# Patient Record
Sex: Female | Born: 1950 | Race: White | Hispanic: No | Marital: Married | Smoking: Never smoker
Health system: Southern US, Community
[De-identification: ages and names within clinical notes are randomized; demographics above are authoritative.]

## PROBLEM LIST (undated history)

## (undated) DIAGNOSIS — C801 Malignant (primary) neoplasm, unspecified: Secondary | ICD-10-CM

## (undated) DIAGNOSIS — K219 Gastro-esophageal reflux disease without esophagitis: Secondary | ICD-10-CM

## (undated) DIAGNOSIS — E7800 Pure hypercholesterolemia, unspecified: Secondary | ICD-10-CM

## (undated) DIAGNOSIS — I2699 Other pulmonary embolism without acute cor pulmonale: Secondary | ICD-10-CM

## (undated) DIAGNOSIS — Z9221 Personal history of antineoplastic chemotherapy: Secondary | ICD-10-CM

## (undated) DIAGNOSIS — F419 Anxiety disorder, unspecified: Secondary | ICD-10-CM

## (undated) DIAGNOSIS — M792 Neuralgia and neuritis, unspecified: Secondary | ICD-10-CM

## (undated) DIAGNOSIS — R32 Unspecified urinary incontinence: Secondary | ICD-10-CM

## (undated) DIAGNOSIS — Z923 Personal history of irradiation: Secondary | ICD-10-CM

## (undated) HISTORY — PX: OTHER SURGICAL HISTORY: SHX169

## (undated) HISTORY — PX: APPENDECTOMY: SHX54

## (undated) HISTORY — PX: MASTECTOMY: SHX3

---

## 2011-02-01 DIAGNOSIS — E7800 Pure hypercholesterolemia, unspecified: Secondary | ICD-10-CM | POA: Insufficient documentation

## 2016-01-15 ENCOUNTER — Encounter: Payer: Medicare HMO | Attending: Surgery | Admitting: Surgery

## 2016-01-15 DIAGNOSIS — Z86711 Personal history of pulmonary embolism: Secondary | ICD-10-CM | POA: Diagnosis not present

## 2016-01-15 DIAGNOSIS — G629 Polyneuropathy, unspecified: Secondary | ICD-10-CM | POA: Insufficient documentation

## 2016-01-15 DIAGNOSIS — C50111 Malignant neoplasm of central portion of right female breast: Secondary | ICD-10-CM | POA: Insufficient documentation

## 2016-01-15 DIAGNOSIS — L98491 Non-pressure chronic ulcer of skin of other sites limited to breakdown of skin: Secondary | ICD-10-CM | POA: Insufficient documentation

## 2016-01-15 DIAGNOSIS — C50911 Malignant neoplasm of unspecified site of right female breast: Secondary | ICD-10-CM | POA: Diagnosis present

## 2016-01-15 DIAGNOSIS — E669 Obesity, unspecified: Secondary | ICD-10-CM | POA: Diagnosis not present

## 2016-01-15 DIAGNOSIS — K219 Gastro-esophageal reflux disease without esophagitis: Secondary | ICD-10-CM | POA: Diagnosis not present

## 2016-01-15 DIAGNOSIS — L599 Disorder of the skin and subcutaneous tissue related to radiation, unspecified: Secondary | ICD-10-CM | POA: Insufficient documentation

## 2016-01-15 DIAGNOSIS — Z85828 Personal history of other malignant neoplasm of skin: Secondary | ICD-10-CM | POA: Diagnosis not present

## 2016-01-15 DIAGNOSIS — E785 Hyperlipidemia, unspecified: Secondary | ICD-10-CM | POA: Insufficient documentation

## 2016-01-15 DIAGNOSIS — R32 Unspecified urinary incontinence: Secondary | ICD-10-CM | POA: Diagnosis not present

## 2016-01-16 NOTE — Progress Notes (Signed)
SHAURI, ELLINGHAM ([id number]) Visit Report for 01/15/2016 HBO Risk Assessment Details Patient Name: [name] [name], [name] [name] 01/15/2016 9:30 Date of Service: AM Medical Record [id number] Number: Patient Account Number: [id number] 11-24-1950 (65 y.o. Treating RN: Ahmed Prima Date of Birth/Sex: Female) Other Clinician: Cornell Barman Primary Care Physician: Emily Filbert Treating Christin Fudge Referring Physician: Emily Filbert Physician/Extender: Suella Grove in Treatment: 0 HBO Risk Assessment Items Answer Barotrauma Risks: Upper Respiratory Infections Yes Prior Radiation Treatment to Head/Neck No Tracheostomy No Ear problems or surgery (otosclerosis)- Consider pressure equalization tubes No Sinus Problems, Sinus Obstruction No Pulmonary Risks: Currently seeing a pulmonologisto No Emphysema No Pneumothorax No Tuberculosis No Other lung problems (COPD with CO2 retention, lesions, surgery) -Refer to CPGs No Congestive heart Failure -Consider holding HBO if ejection fraction<30% No History of smoking No Bullous Disease, Blebs No Other pulmonary abnormalities No Cardiac Risks: Currently seeing a cardiologisto No Pacemaker/AICD No Hypertension No Diuretic Used (water pill). If yes, last time taken: No History of prior or current malignancy (Cancer) Surgery Yes Radiation therapy Yes If Yes for Radiation Therapy, number of treatments received: unknown Chemotherapy Yes CHERELL, MARKE ([id number]) Ophthalmic Risks: Optic Neuritis No Cataracts No Myopia No Retinopathy or Retinal Detachment Surgery- Consider pressure equalization tubes No Confinement Anxiety Claustrophobia No Dialysis Dialysis No Any implants; medical or non-medical No Pregnancy No Seizures Seizures No Currently using these medications: Aspirin No Digoxin (CHF patient) No Narcotics Yes Nitroprusside No Phenothiazine (Thorazine,etc.) No Prednisone or other steroids No Disulfiram (Antabuse) No Mafenide Acetate  (Sulfamylon-burn cream) No Amiodarone No Electronic Signature(s) Signed: 01/15/2016 5:39:56 PM By: Alric Quan Entered By: Alric Quan on 01/15/2016 10:11:37

## 2016-01-16 NOTE — Progress Notes (Signed)
[name] [name], [name] [name] ([id number]) Visit Report for 01/15/2016 Chief Complaint Document Details Patient Name: [name] [name], [name] [name] 01/15/2016 9:30 Date of Service: AM Medical Record [id number] Number: Patient Account Number: [id number] 01/25/1951 (65 y.o. Treating RN: Ahmed Prima Date of Birth/Sex: Female) Other Clinician: Primary Care Physician: Emily Filbert Treating Christin Fudge Referring Physician: Emily Filbert Physician/Extender: Suella Grove in Treatment: 0 Information Obtained from: Patient Chief Complaint Patient seen for complaints of Non-Healing Wound to the right chest wall and axilla which she has had for about 2 years. Electronic Signature(s) Signed: 01/15/2016 10:10:01 AM By: Christin Fudge MD, FACS Entered By: Christin Fudge on 01/15/2016 10:10:01 [name] [name] ([id number]) -------------------------------------------------------------------------------- HPI Details Patient Name: [name] [name], [name] [name] 01/15/2016 9:30 Date of Service: AM Medical Record [id number] Number: Patient Account Number: [id number] Nov 07, 1950 (65 y.o. Treating RN: Ahmed Prima Date of Birth/Sex: Female) Other Clinician: Primary Care Physician: Emily Filbert Treating Christin Fudge Referring Physician: Emily Filbert Physician/Extender: Suella Grove in Treatment: 0 History of Present Illness Location: right chest wall and axilla Quality: Patient reports experiencing a dull pain to affected area(s). Severity: Patient states wound are getting worse. Duration: Patient has had the wound for > 24 months prior to seeking treatment at the wound center Timing: Pain in wound is constant (hurts all the time) Context: The wound appeared gradually over time Modifying Factors: Other treatment(s) tried include:radiation and chemotherapy received 2 different occasions in 2006 and 2015 Associated Signs and Symptoms: Patient reports having:general malaise and tiredness and feels very weak most of the day HPI Description: 65 year old  patient who has been sent by her PCP Dr. Emily Filbert who saw her recently on seventh of June 2017 for right anterior chest wall chronic pain and draining wound which has been there for several years. 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 Her past surgical history is significant for an axillary lymphadenectomy on the right side in February 2015, partial mastectomy in March 2015, and appendectomy. Past medical history significant for GERD, hyperlipidemia, obesity, peripheral neuropathy, pulmonary embolism, urinary incontinence. Reviewing the notes from a recent visit in May where she saw her oncologist Dr. Acey Lav, she had a radiation-induced angiosarcoma and developed skin nodules and right breast mass in August 2014 after treatment of her breast cancer. she received 5 cycles of chemotherapy and had posttreatment neuropathy and thrombocytopenia. 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 he also received concurrent radiation therapy. The radiation treatment was completed in June 2015. the medical oncologist noted that she had some breakdown of skin and her radiation-induced multifocal angiosarcoma was showing no evidence of local recurrence or metastatic disease and he recommended continue surveillance. The treatments summary of radiation oncology done by Dr. Clemon Chambers -- revealed that she had a right chest wall radiation between 11/08/2013 to 12/10/2013 and a total of 5000 cGy was given. She also received a boost of 7000 cGy between May 18 to 12/27/2013. note was made that the gross disease of the chest wall resolved after 2 weeks of therapy and she was to follow-up with her surgeon and medical oncologist. Electronic Signature(s) Signed: 01/15/2016 10:11:07 AM By: Christin Fudge MD, FACS Previous Signature: 01/15/2016 9:43:05 AM Version By: Christin Fudge MD, FACS Previous Signature: 01/15/2016  9:28:51 AM Version By: Christin Fudge MD, FACS Entered By: Christin Fudge on 01/15/2016 10:11:07 [name] [name], [name] [name] ([id number]) [name] [name], [name] [name] ([id number]) -------------------------------------------------------------------------------- Physical Exam Details Patient Name: [name] [name], [name] [name] 01/15/2016 9:30 Date of Service: AM Medical Record [id number] Number: Patient Account Number: [id number] [birth year] (65 y.o. Treating  RN: Ahmed Prima Date of Birth/Sex: Female) Other Clinician: Primary Care Physician: Emily Filbert Treating Christin Fudge Referring Physician: Emily Filbert Physician/Extender: Suella Grove in Treatment: 0 Constitutional . Pulse regular. Respirations normal and unlabored. Afebrile. . Eyes Nonicteric. Reactive to light. Ears, Nose, Mouth, and Throat Lips, teeth, and gums WNL.Marland Kitchen Moist mucosa without lesions. Neck supple and nontender. No palpable supraclavicular or cervical adenopathy. Normal sized without goiter. Respiratory WNL. No retractions.. Cardiovascular Pedal Pulses WNL. No clubbing, cyanosis or edema. Chest the right breast has been removed with the previous mastectomy and the skin flaps show some thickening and changes of delayed effects of radiation.. no masses are palpable on the chest wall or the axilla and the supraclavicular area.. Gastrointestinal (GI) Abdomen without masses or tenderness.. No liver or spleen enlargement or tenderness.. Lymphatic No adneopathy. No adenopathy. No adenopathy. Musculoskeletal Adexa without tenderness or enlargement.. Digits and nails w/o clubbing, cyanosis, infection, petechiae, ischemia, or inflammatory conditions.. Integumentary (Hair, Skin) No suspicious lesions. No crepitus or fluctuance. No peri-wound warmth or erythema. No masses.Marland Kitchen Psychiatric Judgement and insight Intact.. No evidence of depression, anxiety, or agitation.. Notes the patient has had a right-sided mastectomy and axillary lymph node dissection and has  received radiation on 2 different occasions along with chemotherapy which has caused thickening and induration of the skin and subcutaneous tissue which is showing signs of late effects of radiation. At the present time there is some superficial excoriation of the skin but no large ulcerations. AILSA, MASSAQUOI ([id number]) Electronic Signature(s) Signed: 01/15/2016 10:21:05 AM By: Christin Fudge MD, FACS Entered By: Christin Fudge on 01/15/2016 10:21:05 [name] [name] ([id number]) -------------------------------------------------------------------------------- Physician Orders Details Patient Name: [name] [name], [name] [name] 01/15/2016 9:30 Date of Service: AM Medical Record [id number] Number: Patient Account Number: [id number] [birth year] (65 y.o. Treating RN: Ahmed Prima Date of Birth/Sex: Female) Other Clinician: Primary Care Physician: Emily Filbert Treating Christin Fudge Referring Physician: Emily Filbert Physician/Extender: Suella Grove in Treatment: 0 Verbal / Phone Orders: No Diagnosis Coding Hyperbaric Oxygen Therapy o Evaluate for HBO Therapy Radiology o X-ray, Chest - HBO Clearance [employer] o EKG - HBO Clearance Electronic Signature(s) Signed: 01/15/2016 5:05:24 PM By: Christin Fudge MD, FACS Signed: 01/15/2016 5:39:56 PM By: Alric Quan Entered By: Alric Quan on 01/15/2016 10:01:12 [name] [name] ([id number]) -------------------------------------------------------------------------------- Problem List Details Patient Name: [name] [name], [name] [name] 01/15/2016 9:30 Date of Service: AM Medical Record [id number] Number: Patient Account Number: [id number] 1951-05-05 (65 y.o. 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 Treating RN: Ahmed Prima Date of Birth/Sex: Female) Other Clinician: Primary Care Physician: Emily Filbert Treating Christin Fudge Referring Physician: Emily Filbert Physician/Extender: Suella Grove in Treatment: 0 Subjective Chief Complaint Information obtained from Patient Patient seen for complaints of Non-Healing Wound to the right chest wall and axilla which  she has had for about 2 years. History of Present Illness (HPI) The following HPI elements were documented for the patient's wound: Location: right chest wall and axilla Quality: Patient reports experiencing a dull pain to affected area(s). Severity: Patient states wound are getting worse. Duration: Patient has had the wound for > 24 months prior to seeking treatment at the wound center Timing: Pain in wound is constant (hurts all the time) Context: The wound appeared gradually over time Modifying Factors: Other treatment(s) tried include:radiation and chemotherapy received 2 different occasions in 2006 and 2015 Associated Signs and Symptoms: Patient reports having:general malaise and tiredness and feels very weak most of the day 65 year old  patient who has been sent by her PCP Dr. Emily Filbert who saw her recently on seventh of June 2017 for right anterior chest wall chronic pain and draining wound which has been there for several years. 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 Her past surgical history is significant for an axillary lymphadenectomy on the right side in February 2015, partial mastectomy in March 2015, and appendectomy. Past medical history significant for GERD, hyperlipidemia, obesity, peripheral neuropathy, pulmonary embolism, urinary incontinence. Reviewing the notes from a recent visit in May where she saw her oncologist Dr. Acey Lav, she had a radiation-induced angiosarcoma and developed skin nodules and right breast mass in August 2014 after treatment of her breast cancer. she received 5 cycles of chemotherapy and had posttreatment neuropathy and thrombocytopenia. 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 he also received concurrent radiation therapy. The radiation treatment was completed in June 2015. the medical oncologist noted that she had some breakdown of skin and her radiation-induced multifocal angiosarcoma was showing no evidence of local recurrence or metastatic disease and he recommended continue surveillance. The treatments summary of radiation oncology done by Dr. Clemon Chambers -- revealed that she had a right chest wall radiation between 11/08/2013 to 12/10/2013 and a total of 5000 cGy was given. She also received a boost of 7000 cGy between May 18 to 12/27/2013. note was made that the gross disease of the chest wall resolved after 2 weeks of therapy and she was to follow-up with her surgeon and [place], [place] ([id number]) oncologist. Wound History Patient presents with 1 open wound that has been present for approximately 2 yrs. Patient has been treating wound in the following manner: vaseline.  Laboratory tests have not been performed in the last month. Patient reportedly has not tested positive for an antibiotic resistant organism. Patient reportedly has not tested positive for osteomyelitis. Patient reportedly has not had testing performed to evaluate circulation in the legs. Patient experiences the following problems associated with their wounds: swelling. Patient History Information obtained from Patient. Allergies NKDA Family History Cancer - Maternal Grandparents, Heart Disease - Father, Hypertension - Father, Lung Disease - Mother, Stroke - Father, No family history of Diabetes, Hereditary Spherocytosis, Kidney Disease, Seizures, Thyroid Problems, Tuberculosis. Social History Never smoker, Marital Status - Married, Alcohol Use - Never, Drug Use - No History, Caffeine Use - Rarely. Medical History Neurologic Patient has history of Neuropathy Oncologic Patient has history of Received Chemotherapy - 2006, Received Radiation - 2006 Medical And Surgical History Notes Oncologic breast cancer skin cancer Review of Systems (ROS) Constitutional Symptoms (General Health) The patient has no complaints or symptoms. Eyes The patient has no complaints or symptoms. Ear/Nose/Mouth/Throat The patient has no complaints or symptoms. Hematologic/Lymphatic hx of blood clot Respiratory hx of pulmonary embolism  Cardiovascular hyperlipidemia Gastrointestinal esophageal reflux GERD. tubular adenoma of colon JAIEL, HOWER ([id number]) Endocrine The patient has no complaints or symptoms. Genitourinary Complains or has symptoms of Incontinence/dribbling. Immunological The patient has no complaints or symptoms. Integumentary (Skin) Complains or has symptoms of Wounds, skin cancer on right breast Musculoskeletal The patient has no complaints or symptoms. Medications Tylenol 325 mg tablet oral 2 2 tablet oral every 4 hours as needed for pain tramadol 50 mg tablet oral tablet  oral lorazepam 0.5 mg tablet oral 1 1 tablet oral every 8 hours as needed gabapentin 300 mg capsule oral 3 3 capsule oral nightly simvastatin 20 mg tablet oral tablet oral Flonase Allergy Relief 50 mcg/actuation nasal spray,suspension nasal spray,suspension nasal daily as needed omeprazole 20 mg capsule,delayed release oral capsule,delayed release(DR/EC) oral once daily Objective Constitutional Pulse regular. Respirations normal and unlabored. Afebrile. Vitals Time Taken: 9:10 AM, Height: 66 in, Source: Stated, Weight: 213.4 lbs, Source: Measured, BMI: 34.4, Temperature: 97.7 F, Pulse: 77 bpm, Respiratory Rate: 20 breaths/min, Blood Pressure: 130/78 mmHg. Eyes Nonicteric. Reactive to light. Ears, Nose, Mouth, and Throat Lips, teeth, and gums WNL.Marland Kitchen Moist mucosa without lesions. Neck supple and nontender. No palpable supraclavicular or cervical adenopathy. Normal sized without goiter. Respiratory WNL. No retractions.Marcine Matar, Freda Munro ([id number]) Cardiovascular Pedal Pulses WNL. No clubbing, cyanosis or edema. Chest the right breast has been removed with the previous mastectomy and the skin flaps show some thickening and changes of delayed effects of radiation.. no masses are palpable on the chest wall or the axilla and the supraclavicular area.. Gastrointestinal (GI) Abdomen without masses or tenderness.. No liver or spleen enlargement or tenderness.. Lymphatic No adneopathy. No adenopathy. No adenopathy. Musculoskeletal Adexa without tenderness or enlargement.. Digits and nails w/o clubbing, cyanosis, infection, petechiae, ischemia, or inflammatory conditions.Marland Kitchen Psychiatric Judgement and insight Intact.. No evidence of depression, anxiety, or agitation.. General Notes: the patient has had a right-sided mastectomy and axillary lymph node dissection and has received radiation on 2 different occasions along with chemotherapy which has caused thickening and induration of the skin and  subcutaneous tissue which is showing signs of late effects of radiation. At the present time there is some superficial excoriation of the skin but no large ulcerations. Integumentary (Hair, Skin) No suspicious lesions. No crepitus or fluctuance. No peri-wound warmth or erythema. No masses.. Other Condition(s) Patient presents with Other Dermatologic Condition located on the Right right breast mastectomy. 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 She then developed an angiosarcoma of the right breast and chest wall and this was treated with a mastectomy, aggressive chemotherapy and radiation which was completed in June 2015. Details of her radiation therapy have been noted. At this time she is showing signs of delayed effects of radiation to the right chest wall, subcutaneous tissue and axilla and I believe she would benefit from hyperbaric oxygen therapy, to be given with the appropriate protocol over 30 settings to be given over 6 weeks. I have discussed this in detail with the patient and her husband was at the bedside and the risks benefits alternatives and  all the possible benefits and complications were gone through. She is agreeable about it and principal and we will work with her insurance company for authorization regarding this treatment modality. Plan Hyperbaric Oxygen Therapy: Evaluate for HBO Therapy Radiology ordered were: X-ray, Chest - HBO Clearance ordered  were: EKG - HBO Clearance This 65 year old patient who has initially had a adenocarcinoma of the right breast followed with lumpectomy, axillary dissection and received radiation and chemotherapy in 2006. She then developed an angiosarcoma of the right breast and chest wall and this was treated with a mastectomy, aggressive chemotherapy and radiation which was completed in June 2015. Details of her radiation therapy have been noted. At this time she is showing signs of delayed effects of radiation to the right chest wall, subcutaneous tissue and axilla and I believe she would benefit from hyperbaric oxygen therapy, to be given with the appropriate protocol over 30 settings to be given over 6 weeks. I have discussed this in detail with the patient and her husband was at the bedside and the risks benefits alternatives and all the possible benefits and complications were gone through. She is agreeable about it and principal and we will work with her insurance company for authorization regarding this treatment modality. MAKILA, MALER ([id number]) Electronic Signature(s) Signed: 01/15/2016 5:06:19 PM By: Christin Fudge MD, FACS Previous Signature: 01/15/2016 10:24:37 AM Version By: Christin Fudge MD, FACS Entered By: Christin Fudge on 01/15/2016 17:06:19 [name] [name] ([id number]) -------------------------------------------------------------------------------- ROS/PFSH Details Patient Name: [name] [name], [name] [name] 01/15/2016 9:30 Date of Service: AM Medical Record [id number] Number: Patient Account Number: [id number] 06/19/1951 (65 y.o. Treating RN: Ahmed Prima Date of Birth/Sex: Female) Other Clinician: Primary Care Physician: Emily Filbert Treating Christin Fudge Referring Physician: Emily Filbert Physician/Extender: Suella Grove in Treatment: 0 Information Obtained From Patient Wound History Do you currently have one or more open woundso Yes How many open wounds do you currently haveo 1 Approximately  how long have you had your woundso 2 yrs How have you been treating your wound(s) until nowo vaseline Has your wound(s) ever healed and then re-openedo No Have you had any lab work done in the past montho No Have you tested positive for an antibiotic resistant organism (MRSA, VRE)o No Have you tested positive for osteomyelitis (bone infection)o No Have you had any tests for circulation on your legso No Have you had other problems associated with your woundso Swelling Genitourinary Complaints and Symptoms: Positive for: Incontinence/dribbling Integumentary (Skin) Complaints and Symptoms: Positive for: Wounds Review of System Notes: skin cancer on right breast Constitutional Symptoms (General Health) Complaints and Symptoms: No Complaints or Symptoms Eyes Complaints and Symptoms: No Complaints or Symptoms Ear/Nose/Mouth/Throat [name] [name], KADRMAS ([id number]) Complaints and Symptoms: No Complaints or Symptoms Hematologic/Lymphatic Complaints and Symptoms: Review of System Notes: hx of blood clot Respiratory Complaints and Symptoms: Review of System Notes: hx of pulmonary embolism Cardiovascular Complaints and Symptoms: Review of System Notes: hyperlipidemia Gastrointestinal Complaints and Symptoms: Review of System Notes: esophageal reflux GERD. tubular adenoma of colon Endocrine Complaints and Symptoms: No Complaints or Symptoms Immunological Complaints and Symptoms: No Complaints or Symptoms Musculoskeletal Complaints and Symptoms: No Complaints or Symptoms Neurologic Medical History: Positive for: Neuropathy Oncologic Medical History: Positive for: Received Chemotherapy - 2006; Received Radiation - 2006 SAMIKA, WALDRIP ([id number]) Past Medical History Notes: breast cancer skin cancer Family and Social History Cancer: Yes - Maternal Grandparents; Diabetes: No; Heart Disease: Yes - Father; Hereditary Spherocytosis: No; Hypertension: Yes - Father; Kidney Disease:  No; Lung Disease: Yes - Mother; Seizures: No; Stroke:  Yes - Father; Thyroid Problems: No; Tuberculosis: No; Never smoker; Marital Status - Married; Alcohol Use: Never; Drug Use: No History; Caffeine Use: Rarely; Financial Concerns: No; Food, Clothing or Shelter Needs: No; Support System Lacking: No; Transportation Concerns: No; Advanced Directives: No; Patient does not want information on Advanced Directives; Do not resuscitate: No; Living Will: No; Medical Power of Attorney: No Physician Affirmation I have reviewed and agree with the above information. Electronic Signature(s) Signed: 01/15/2016 9:23:13 AM By: Christin Fudge MD, FACS Signed: 01/15/2016 5:39:56 PM By: Alric Quan Entered By: Christin Fudge on 01/15/2016 09:23:13 [name] [name] ([id number]) -------------------------------------------------------------------------------- SuperBill Details Patient Name: [name] [name] Date of Service: 01/15/2016 Medical Record Number: [id number] Patient Account Number: [id number] Date of Birth/Sex: 1951/07/24 (64 y.o. 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 C50.111 Malignant neoplasm of central portion of right female breast Facility Procedures CPT4 Code: [id number] Description: 99213 - WOUND CARE VISIT-LEV 3 EST PT Modifier: Quantity: 1 Physician Procedures CPT4: Description Modifier Quantity Code [id number] - WC PHYS LEVEL 4 - NEW PT 1 ICD-10 Description Diagnosis L59.9 Disorder of the skin and subcutaneous tissue related to radiation, unspecified  C50.911 Malignant neoplasm of unspecified site of right  female breast 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

## 2016-01-16 NOTE — Progress Notes (Signed)
[name] [name] ([id number]) Visit Report for 01/15/2016 Allergy List Details Patient Name: [name] [name], KREKELER Date of Service: 01/15/2016 9:30 AM Medical Record Number: [id number] Patient Account Number: [id number] Date of Birth/Sex: [birth year] (64 y.o. Female) Treating RN: Ahmed Prima Primary Care Physician: Emily Filbert Other Clinician: Referring Physician: Emily Filbert Treating Physician/Extender: Frann Rider in Treatment: 0 Allergies Active Allergies NKDA Allergy Notes Electronic Signature(s) Signed: 01/15/2016 5:39:56 PM By: Alric Quan Entered By: Alric Quan on 01/15/2016 09:55:32 [name] [name] ([id number]) -------------------------------------------------------------------------------- Arrival Information Details Patient Name: [name] [name] Date of Service: 01/15/2016 9:30 AM Medical Record Number: [id number] Patient Account Number: [id number] Date of Birth/Sex: Jan 12, 1951 (64 y.o. Female) Treating RN: Ahmed Prima Primary Care Physician: Emily Filbert Other Clinician: Referring Physician: Emily Filbert Treating Physician/Extender: Frann Rider in Treatment: 0 Visit Information Patient Arrived: Ambulatory Arrival Time: 09:08 Accompanied By: husband Transfer Assistance: None Patient Identification Verified: Yes Secondary Verification Process Yes Completed: Patient Requires Transmission-Based No Precautions: Patient Has Alerts: Yes Electronic Signature(s) Signed: 01/15/2016 5:39:56 PM By: Alric Quan Entered By: Alric Quan on 01/15/2016 09:08:35 [name] [name] ([id number]) -------------------------------------------------------------------------------- Clinic Level of Care Assessment Details Patient Name: [name] [name] Date of Service: 01/15/2016 9:30 AM Medical Record Number: [id number] Patient Account Number: [id number] Date of Birth/Sex: 1950-08-23 (64 y.o. Female) Treating RN: Ahmed Prima Primary Care Physician: Emily Filbert Other Clinician: Referring Physician: Emily Filbert Treating Physician/Extender: Frann Rider in Treatment: 0 Clinic Level of Care Assessment Items TOOL 2 Quantity Score X - Use when only an EandM is performed on the INITIAL visit 1 0 ASSESSMENTS - Nursing Assessment / Reassessment X - General Physical Exam (combine w/ comprehensive assessment (listed just 1 20 below) when performed on new pt. evals) X - Comprehensive Assessment (HX, ROS, Risk Assessments, Wounds Hx, etc.) 1 25 ASSESSMENTS - Wound and Skin Assessment / Reassessment []  - Simple Wound Assessment / Reassessment - one wound 0 []  - Complex Wound Assessment / Reassessment - multiple wounds 0 X - Dermatologic / Skin Assessment (not related to wound area) 1 10 ASSESSMENTS - Ostomy and/or Continence Assessment and Care []  - Incontinence Assessment and Management 0 []  - Ostomy Care Assessment and Management (repouching, etc.) 0 PROCESS - Coordination of Care []  - Simple Patient / Family Education for ongoing care 0 X - Complex (extensive) Patient / Family Education for ongoing care 1 20 X - Staff obtains Programmer, systems, Records, Test Results / Process Orders 1 10 []  - Staff telephones HHA, Nursing Homes / Clarify orders / etc 0 []  - Routine Transfer to another Facility (non-emergent condition) 0 []  - Routine Hospital Admission (non-emergent condition) 0 []  - New Admissions / Biomedical engineer / Ordering NPWT, Apligraf, etc. 0 []  - Emergency Hospital Admission (emergent condition) 0 X - Simple Discharge Coordination 1 10 [name] [name] ([id number]) []  - Complex (extensive) Discharge Coordination 0 PROCESS - Special Needs []  - Pediatric / Minor Patient Management 0 []  - Isolation Patient Management 0 []  - Hearing / Language / Visual special needs 0 []  - Assessment of Community assistance (transportation, D/C planning, etc.) 0 []  - Additional assistance / Altered mentation 0 []  - Support Surface(s) Assessment (bed,  cushion, seat, etc.) 0 INTERVENTIONS - Wound Cleansing / Measurement X - Wound Imaging (photographs - any number of wounds) 1 5 []  - Wound Tracing (instead of photographs) 0 []  - Simple Wound Measurement - one wound 0 []  - Complex Wound Measurement - multiple wounds 0 X - Simple Wound Cleansing - one wound 1 5 []  - Complex  Wound Cleansing - multiple wounds 0 INTERVENTIONS - Wound Dressings []  - Small Wound Dressing one or multiple wounds 0 []  - Medium Wound Dressing one or multiple wounds 0 []  - Large Wound Dressing one or multiple wounds 0 []  - Application of Medications - injection 0 INTERVENTIONS - Miscellaneous []  - External ear exam 0 []  - Specimen Collection (cultures, biopsies, blood, body fluids, etc.) 0 []  - Specimen(s) / Culture(s) sent or taken to Lab for analysis 0 []  - Patient Transfer (multiple staff / Harrel Lemon Lift / Similar devices) 0 []  - Simple Staple / Suture removal (25 or less) 0 []  - Complex Staple / Suture removal (26 or more) 0 [name] [name] ([id number]) []  - Hypo / Hyperglycemic Management (close monitor of Blood Glucose) 0 []  - Ankle / Brachial Index (ABI) - do not check if billed separately 0 Has the patient been seen at the hospital within the last three years: Yes Total Score: 105 Level Of Care: New/Established - Level 3 Electronic Signature(s) Signed: 01/15/2016 5:39:56 PM By: Alric Quan Entered By: Alric Quan on 01/15/2016 11:22:07 [name] [name] ([id number]) -------------------------------------------------------------------------------- Encounter Discharge Information Details Patient Name: [name] [name] Date of Service: 01/15/2016 9:30 AM Medical Record Number: [id number] Patient Account Number: [id number] Date of Birth/Sex: Jul 07, 1951 (65 y.o. Female) Treating RN: Ahmed Prima Primary Care Physician: Emily Filbert Other Clinician: Referring Physician: Emily Filbert Treating Physician/Extender: Frann Rider in Treatment:  0 Encounter Discharge Information Items Discharge Pain Level: 0 Discharge Condition: Stable Ambulatory Status: Ambulatory Discharge Destination: Home Transportation: Private Auto Accompanied By: husband Schedule Follow-up Appointment: Yes Medication Reconciliation completed and provided to Patient/Care No Franck Vinal: Provided on Clinical Summary of Care: 01/15/2016 Form Type Recipient Paper Patient SE Electronic Signature(s) Signed: 01/15/2016 10:28:00 AM By: Ruthine Dose Entered By: Ruthine Dose on 01/15/2016 10:28:00 [name] [name] ([id number]) -------------------------------------------------------------------------------- Lower Extremity Assessment Details Patient Name: [name] [name] Date of Service: 01/15/2016 9:30 AM Medical Record Number: [id number] Patient Account Number: [id number] Date of Birth/Sex: [birth year] (64 y.o. Female) Treating RN: Carolyne Fiscal, Debi Primary Care Physician: Emily Filbert Other Clinician: Referring Physician: Emily Filbert Treating Physician/Extender: Frann Rider in Treatment: 0 Electronic Signature(s) Signed: 01/15/2016 5:39:56 PM By: Alric Quan Entered By: Alric Quan on 01/15/2016 09:24:58 [name] [name] ([id number]) -------------------------------------------------------------------------------- Multi Wound Chart Details Patient Name: [name] [name] Date of Service: 01/15/2016 9:30 AM Medical Record Number: [id number] Patient Account Number: [id number] Date of Birth/Sex: [birth year] (64 y.o. Female) Treating RN: Ahmed Prima Primary Care Physician: Emily Filbert Other Clinician: Referring Physician: Emily Filbert Treating Physician/Extender: Frann Rider in Treatment: 0 Vital Signs Height(in): 66 Pulse(bpm): 77 Weight(lbs): 213.4 Blood Pressure 130/78 (mmHg): Body Mass Index(BMI): 34 Temperature(F): 97.7 Respiratory Rate 20 (breaths/min): Photos: [1:No Photos] [N/A:N/A] Wound Location: [1:Right Breast  (mastectomy site)] [N/A:N/A] Wounding Event: [1:Surgical Injury] [N/A:N/A] Primary Etiology: [1:Open Surgical Wound] [N/A:N/A] Comorbid History: [1:Neuropathy, Received Chemotherapy, Received Radiation] [N/A:N/A] Date Acquired: [1:03/17/2014] [N/A:N/A] Weeks of Treatment: [1:0] [N/A:N/A] Wound Status: [1:Open] [N/A:N/A] Classification: [1:Partial Thickness] [N/A:N/A] Exudate Amount: [1:None Present] [N/A:N/A] Wound Margin: [1:Flat and Intact] [N/A:N/A] Granulation Amount: [1:Large (67-100%)] [N/A:N/A] Granulation Quality: [1:Red, Pink] [N/A:N/A] Necrotic Amount: [1:None Present (0%)] [N/A:N/A] Exposed Structures: [1:Fascia: No Fat: No Tendon: No Muscle: No Joint: No Bone: No Limited to Skin Breakdown] [N/A:N/A] Epithelialization: [1:None] [N/A:N/A] Periwound Skin Texture: No Abnormalities Noted [N/A:N/A] Periwound Skin [1:Moist: Yes] [N/A:N/A] Moisture: Periwound Skin Color: Erythema: Yes [N/A:N/A] Erythema Location: Circumferential N/A N/A Temperature: No Abnormality N/A N/A Tenderness on Yes N/A N/A Palpation: Wound Preparation: Ulcer Cleansing: N/A N/A Rinsed/Irrigated with Saline Topical  Anesthetic Applied: Other: lidocaine 4% Treatment Notes Electronic Signature(s) Signed: 01/15/2016 5:39:56 PM By: Alric Quan Entered By: Alric Quan on 01/15/2016 09:52:40 [name] [name] ([id number]) -------------------------------------------------------------------------------- Multi-Disciplinary Care Plan Details Patient Name: [name] [name] Date of Service: 01/15/2016 9:30 AM Medical Record Number: [id number] Patient Account Number: [id number] Date of Birth/Sex: [birth year] (64 y.o. Female) Treating RN: Carolyne Fiscal, Debi Primary Care Physician: Emily Filbert Other Clinician: Referring Physician: Emily Filbert Treating Physician/Extender: Frann Rider in Treatment: 0 Active Inactive Orientation to the Wound Care Program Nursing Diagnoses: Knowledge deficit related to  the wound healing center program Goals: Patient/caregiver will verbalize understanding of the [place] Program Date Initiated: 01/15/2016 Goal Status: Active Interventions: Provide education on orientation to the wound center Notes: Pain, Acute or Chronic Nursing Diagnoses: Pain, acute or chronic: actual or potential Potential alteration in comfort, pain Goals: Patient will verbalize adequate pain control and receive pain control interventions during procedures as needed Date Initiated: 01/15/2016 Goal Status: Active Patient/caregiver will verbalize adequate pain control between visits Date Initiated: 01/15/2016 Goal Status: Active Interventions: Assess comfort goal upon admission Complete pain assessment as per visit requirements Notes: Wound/Skin Impairment [name], [name] ([id number]) Nursing Diagnoses: Impaired tissue integrity Goals: Ulcer/skin breakdown will have a volume reduction of 30% by week 4 Date Initiated: 01/15/2016 Goal Status: Active Ulcer/skin breakdown will have a volume reduction of 50% by week 8 Date Initiated: 01/15/2016 Goal Status: Active Ulcer/skin breakdown will have a volume reduction of 80% by week 12 Date Initiated: 01/15/2016 Goal Status: Active Interventions: Assess ulceration(s) every visit Notes: Electronic Signature(s) Signed: 01/15/2016 5:39:56 PM By: Alric Quan Entered By: Alric Quan on 01/15/2016 09:52:27 [name] [name] ([id number]) -------------------------------------------------------------------------------- Non-Wound Condition Assessment Details Patient Name: [name] [name] Date of Service: 01/15/2016 9:30 AM Medical Record Number: [id number] Patient Account Number: [id number] Date of Birth/Sex: 1951/06/20 (64 y.o. Female) Treating RN: Carolyne Fiscal, Debi Primary Care Physician: Emily Filbert Other Clinician: Referring Physician: Emily Filbert Treating Physician/Extender: Frann Rider in Treatment:  0 Non-Wound Condition: Condition: Other Dermatologic Condition Location: Other: right breast mastectomy Side: Right Photos Periwound Skin Texture Texture Color No Abnormalities Noted: No No Abnormalities Noted: No Moisture No Abnormalities Noted: No Electronic Signature(s) Signed: 01/15/2016 5:39:56 PM By: Alric Quan Entered By: Alric Quan on 01/15/2016 16:58:19 [name] [name] ([id number]) -------------------------------------------------------------------------------- Pain Assessment Details Patient Name: [name] [name] Date of Service: 01/15/2016 9:30 AM Medical Record Number: [id number] Patient Account Number: [id number] Date of Birth/Sex: 1950-11-22 (64 y.o. Female) Treating RN: Ahmed Prima Primary Care Physician: Emily Filbert Other Clinician: Referring Physician: Emily Filbert Treating Physician/Extender: Frann Rider in Treatment: 0 Active Problems Location of Pain Severity and Description of Pain Patient Has Paino Yes Site Locations Pain Location: Pain in Ulcers With Dressing Change: Yes Duration of the Pain. Constant / Intermittento Constant Rate the pain. Current Pain Level: 5 Worst Pain Level: 8 Least Pain Level: 2 Character of Pain Describe the Pain: Aching, Tender Pain Management and Medication Current Pain Management: Electronic Signature(s) Signed: 01/15/2016 5:39:56 PM By: Alric Quan Entered By: Alric Quan on 01/15/2016 09:10:10 [name] [name] ([id number]) -------------------------------------------------------------------------------- Patient/Caregiver Education Details Patient Name: [name] [name] Date of Service: 01/15/2016 9:30 AM Medical Record Number: [id number] Patient Account Number: [id number] Date of Birth/Gender: Apr 02, 1951 (64 y.o. Female) Treating RN: Ahmed Prima Primary Care Physician: Emily Filbert Other Clinician: Referring Physician: Emily Filbert Treating Physician/Extender: Frann Rider in  Treatment: 0 Education Assessment Education Provided To: Patient Education Topics Provided Wound/Skin Impairment: Handouts: Other: keep area clean and dry Methods: Demonstration, Explain/Verbal Responses: State content  correctly Electronic Signature(s) Signed: 01/15/2016 5:39:56 PM By: Alric Quan Entered By: Alric Quan on 01/15/2016 09:55:53 [name] [name] ([id number]) -------------------------------------------------------------------------------- [place] Details Patient Name: [name] [name] Date of Service: 01/15/2016 9:30 AM Medical Record Number: [id number] Patient Account Number: [id number] Date of Birth/Sex: Jun 27, 1951 (64 y.o. Female) Treating RN: Carolyne Fiscal, Debi Primary Care Physician: Emily Filbert Other Clinician: Referring Physician: Emily Filbert Treating Physician/Extender: Frann Rider in Treatment: 0 Vital Signs Time Taken: 09:10 Temperature (F): 97.7 Height (in): 66 Pulse (bpm): 77 Source: Stated Respiratory Rate (breaths/min): 20 Weight (lbs): 213.4 Blood Pressure (mmHg): 130/78 Source: Measured Reference Range: 80 - 120 mg / dl Body Mass Index (BMI): 34.4 Electronic Signature(s) Signed: 01/15/2016 5:39:56 PM By: Alric Quan Entered By: Alric Quan on 01/15/2016 09:12:01

## 2016-01-16 NOTE — Progress Notes (Signed)
NELEH, MCCLASKEY ([id number]) Visit Report for 01/15/2016 Abuse/Suicide Risk Screen Details Patient Name: [name] [name], [name] [name] 01/15/2016 9:30 Date of Service: AM Medical Record [id number] Number: Patient Account Number: [id number] 02/13/1951 (65 y.o. Treating RN: Ahmed Prima Date of Birth/Sex: Female) Other Clinician: Primary Care Physician: Emily Filbert Treating Britto, Errol Referring Physician: Emily Filbert Physician/Extender: Suella Grove in Treatment: 0 Abuse/Suicide Risk Screen Items Answer ABUSE/SUICIDE RISK SCREEN: Has anyone close to you tried to hurt or harm you recentlyo No Do you feel uncomfortable with anyone in your familyo No Has anyone forced you do things that you didnot want to doo No Do you have any thoughts of harming yourselfo No Patient displays signs or symptoms of abuse and/or neglect. No Electronic Signature(s) Signed: 01/15/2016 5:39:56 PM By: Alric Quan Entered By: Alric Quan on 01/15/2016 09:22:26 Dorna Bloom ([id number]) -------------------------------------------------------------------------------- Activities of Daily Living Details Patient Name: [name] [name] 01/15/2016 9:30 Date of Service: AM Medical Record [id number] Number: Patient Account Number: [id number] 10/19/1950 (64 y.o. Treating RN: Ahmed Prima Date of Birth/Sex: Female) Other Clinician: Primary Care Physician: Emily Filbert Treating Christin Fudge Referring Physician: Emily Filbert Physician/Extender: Suella Grove in Treatment: 0 Activities of Daily Living Items Answer Activities of Daily Living (Please select one for each item) Drive Automobile Completely Able Take Medications Completely Able Use Telephone Completely Able Care for Appearance Completely Able Use Toilet Completely Able Bath / Shower Completely Able Dress Self Completely Able Feed Self Completely Able Walk Completely Able Get In / Out Bed Completely Able Housework Completely Able Prepare Meals Completely  [place] for Self Completely Able Electronic Signature(s) Signed: 01/15/2016 5:39:56 PM By: Alric Quan Entered By: Alric Quan on 01/15/2016 09:22:46 Dorna Bloom ([id number]) -------------------------------------------------------------------------------- Education Assessment Details Patient Name: [name] [name] 01/15/2016 9:30 Date of Service: AM Medical Record [id number] Number: Patient Account Number: [id number] 1951-07-10 (65 y.o. Treating RN: Ahmed Prima Date of Birth/Sex: Female) Other Clinician: Primary Care Physician: Emily Filbert Treating Christin Fudge Referring Physician: Emily Filbert Physician/Extender: Suella Grove in Treatment: 0 Primary Learner Assessed: Patient Learning Preferences/Education Level/Primary Language Learning Preference: Explanation, Printed Material Highest Education Level: College or Above Preferred Language: English Cognitive Barrier Assessment/Beliefs Language Barrier: No Translator Needed: No Memory Deficit: No Emotional Barrier: No Cultural/Religious Beliefs Affecting Medical No Care: Physical Barrier Assessment Impaired Vision: No Impaired Hearing: No Decreased Hand dexterity: No Knowledge/Comprehension Assessment Knowledge Level: High Comprehension Level: High Ability to understand written High instructions: Ability to understand verbal High instructions: Motivation Assessment Anxiety Level: Calm Cooperation: Cooperative Education Importance: Acknowledges Need Interest in Health Problems: Asks Questions Perception: Coherent Willingness to Engage in Self- High Management Activities: Readiness to Engage in Self- High Management Activities: ZELINA, PAOLETTI ([id number]) Electronic Signature(s) Signed: 01/15/2016 5:39:56 PM By: Alric Quan Entered By: Alric Quan on 01/15/2016 09:23:16 Dorna Bloom  ([id number]) -------------------------------------------------------------------------------- Fall Risk Assessment Details Patient Name: [name] [name] 01/15/2016 9:30 Date of Service: AM Medical Record [id number] Number: Patient Account Number: [id number] [birth year] (65 y.o. Treating RN: Ahmed Prima Date of Birth/Sex: Female) Other Clinician: Primary Care Physician: Emily Filbert Treating Christin Fudge Referring Physician: Emily Filbert Physician/Extender: Suella Grove in Treatment: 0 Fall Risk Assessment Items Have you had 2 or more falls in the last 12 monthso 0 No Have you had any fall that resulted in injury in the last 12 monthso 0 No FALL RISK ASSESSMENT: History of falling - immediate or within 3 months 0 No Secondary diagnosis 0 No Ambulatory aid None/bed rest/wheelchair/nurse 0 No Crutches/cane/walker 0 No Furniture 0 No IV Access/Saline Lock 0 No Gait/Training  Normal/bed rest/immobile 0 No Weak 0 No Impaired 0 No Mental Status Oriented to own ability 0 Yes Electronic Signature(s) Signed: 01/15/2016 5:39:56 PM By: Alric Quan Entered By: Alric Quan on 01/15/2016 09:23:35 Dorna Bloom ([id number]) -------------------------------------------------------------------------------- Foot Assessment Details Patient Name: [name] [name] 01/15/2016 9:30 Date of Service: AM Medical Record [id number] Number: Patient Account Number: [id number] Jan 28, 1951 (65 y.o. Treating RN: Ahmed Prima Date of Birth/Sex: Female) Other Clinician: Primary Care Physician: Emily Filbert Treating Britto, Errol Referring Physician: Emily Filbert Physician/Extender: Suella Grove in Treatment: 0 Foot Assessment Items Site Locations + = Sensation present, - = Sensation absent, C = Callus, U = Ulcer R = Redness, W = Warmth, M = Maceration, PU = Pre-ulcerative lesion F = Fissure, S = Swelling, D = Dryness Assessment Right: Left: Other Deformity: No No Prior Foot Ulcer: No No Prior  Amputation: No No Charcot Joint: No No Ambulatory Status: Gait: Electronic Signature(s) Signed: 01/15/2016 5:39:56 PM By: Alric Quan Entered By: Alric Quan on 01/15/2016 09:24:46 Dorna Bloom ([id number]) -------------------------------------------------------------------------------- Nutrition Risk Assessment Details Patient Name: [name] [name] 01/15/2016 9:30 Date of Service: AM Medical Record [id number] Number: Patient Account Number: [id number] Jun 06, 1951 (65 y.o. Treating RN: Ahmed Prima Date of Birth/Sex: Female) Other Clinician: Primary Care Physician: Emily Filbert Treating Christin Fudge Referring Physician: Emily Filbert Physician/Extender: Suella Grove in Treatment: 0 Height (in): 66 Weight (lbs): 213.4 Body Mass Index (BMI): 34.4 Nutrition Risk Assessment Items NUTRITION RISK SCREEN: I have an illness or condition that made me change the kind and/or 0 No amount of food I eat I eat fewer than two meals per day 0 No I eat few fruits and vegetables, or milk products 0 No I have three or more drinks of beer, liquor or wine almost every day 0 No I have tooth or mouth problems that make it hard for me to eat 0 No I don't always have enough money to buy the food I need 0 No I eat alone most of the time 0 No I take three or more different prescribed or over-the-counter drugs a 1 Yes day Without wanting to, I have lost or gained 10 pounds in the last six 0 No months I am not always physically able to shop, cook and/or feed myself 0 No Nutrition Protocols Good Risk Protocol Moderate Risk Protocol Electronic Signature(s) Signed: 01/15/2016 5:39:56 PM By: Alric Quan Entered By: Alric Quan on 01/15/2016 09:24:12

## 2016-02-26 ENCOUNTER — Ambulatory Visit: Payer: Medicare HMO | Attending: Sports Medicine | Admitting: Occupational Therapy

## 2016-02-26 DIAGNOSIS — L905 Scar conditions and fibrosis of skin: Secondary | ICD-10-CM | POA: Insufficient documentation

## 2016-02-26 NOTE — Therapy (Signed)
[place] [facility] [address] [place], [address] Phone: [phone]   Fax:  [phone]  Occupational Therapy Treatment  Patient Details  Name: [name] [name] MRN: [id number] Date of Birth: 1950-12-02 No Data Recorded  Encounter Date: 02/26/2016      OT End of Session - 02/26/16 1426    Visit Number 1   Number of Visits 1   Date for OT Re-Evaluation 02/26/16   OT Start Time [id number]   OT Stop Time 1228   OT Time Calculation (min) 57 min   Activity Tolerance Patient tolerated treatment well   Behavior During Therapy [facility] for tasks assessed/performed      No past medical history on file.  No past surgical history on file.  There were no vitals filed for this visit.      Subjective Assessment - 02/26/16 1227    Subjective  I have this area on my breast/chest that keeps on opening , staying red and it hurts - cannot wear my prosthesis /bra - my arm do not swell or under my arm feels the same than other side    Patient Stated Goals I want the pain better on my chest that I can wear my prosthesis   Currently in Pain? Yes   Pain Score 3    Pain Location Chest   Pain Orientation Right   Pain Descriptors / Indicators Burning;Tightness   Pain Type Chronic pain   Pain Onset More than a month ago   Pain Frequency Constant             LYMPHEDEMA/ONCOLOGY QUESTIONNAIRE - 02/26/16 1230      Surgeries   Mastectomy Date --  2014   Lumpectomy Date --  2006   Number Lymph Nodes Removed 2     Treatment   Past Chemotherapy Treatment --  2006 and 2014/15   Past Radiation Treatment --  2006 and 2015     What other symptoms do you have   Are you Having Heaviness or Tightness Yes  Tightness   Are you having Pain Yes   Do you have infections No       Pt can try some desensitization to chest with massage but not over area that opens Soft texture - silk or cotton  then towel and tapping - but short  periods and frequent - but need to tolerate first before can move on to next texture or act   Refer to prosthesis fitter - to see if any new designs in - that is gentler on skin - and for her pocket of  Tissue over anterior shoulder  ?? Consult plastic surgeo                   OT Education - 02/26/16 1426    Education provided Yes   Education Details see pt instruction    Person(s) Educated Patient   Methods Explanation;Demonstration;Tactile cues;Verbal cues;Handout   Comprehension Verbal cues required;Returned demonstration;Verbalized understanding             OT Long Term Goals - 02/26/16 1437      OT LONG TERM GOAL #1   Title Pt and husband verbalize understanding for plan on desentitization and appt with fitter for possible new prosthesis/bra that fits better    Time 1   Period Days   Status Achieved               Plan - 02/26/16 1427  Clinical Impression Statement 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 - pt present and report no symptoms of lymhedema - pt do have radiation scarrring and then a area on R chest that drain at times and she cannot tolerate to wear bra or prosthesis - during eval notice that tissue pocket on anterior shoulder hangs over that area preventing it from  staying dry -she do report at time of mastectomy they could not go higher because of  having fear of skin CA marks - pt is hyper sensitive over chest - pt to do some desentitization  , and  refer to  fitter to asses if there are any new prosthesis or bras  out that will be better fit for her - as well maybe consult with plastic surgeon about pocket  fold  causing  continue irritation  on chest wall - pt measurement compare from R UE to L - was only increase by .4 cm to 1.9 cm - but  pt is R hand dominant - - no need for lymphedema  intervention at this time - pt  to contact if any needs occur that I can help with    Rehab Potential Fair   OT Frequency One time visit   OT  Treatment/Interventions Self-care/ADL training;Patient/family education   Plan pt to contact if can help with anything further   OT Home Exercise Plan see pt instruction    Consulted and Agree with Plan of Care Patient      Patient will benefit from skilled therapeutic intervention in order to improve the following deficits and impairments:  Decreased skin integrity, Pain  Visit Diagnosis: Scar condition and fibrosis of skin - Plan: Ot plan of care cert/re-cert      G-Codes - [id number]    Functional Assessment Tool Used circumference, pain , history - clinical judgement    Functional Limitation Self care   Self Care Current Status [id number]) At least 1 percent but less than 20 percent impaired, limited or restricted   Self Care Goal Status [id number]) At least 1 percent but less than 20 percent impaired, limited or restricted   Self Care Discharge Status [phone]) At least 1 percent but less than 20 percent impaired, limited or restricted      Problem List There are no active problems to display for this patient.   Rosalyn Gess  OTR/L,CLT  02/26/2016, 2:42 PM  [place] [facility] [address], [place], [address] Phone: [phone]   Fax:  [phone]  Name: [name] [name] MRN: [id number] Date of Birth: August 14, 1950

## 2016-02-26 NOTE — Patient Instructions (Signed)
Pt can try some desensitization to chest with massage but not over area that opens Soft texture - silk or cotton  then towel and tapping - but short periods and frequent - but need to tolerate first before can move on to next texture or act   Refer to prosthesis fitter - to see if any new designs in - that is gentler on skin - and for her pocket of  Tissue over anterior shoulder  ?? Consult plastic surgeon

## 2016-04-23 ENCOUNTER — Ambulatory Visit
Admission: RE | Admit: 2016-04-23 | Discharge: 2016-04-23 | Disposition: A | Discharge status: Home / Self Care | Payer: Medicare HMO | Source: Ambulatory Visit | Attending: Internal Medicine | Admitting: Internal Medicine

## 2016-04-23 ENCOUNTER — Other Ambulatory Visit: Payer: Self-pay | Admitting: Internal Medicine

## 2016-04-23 DIAGNOSIS — R079 Chest pain, unspecified: Secondary | ICD-10-CM | POA: Diagnosis present

## 2016-04-23 DIAGNOSIS — Z9011 Acquired absence of right breast and nipple: Secondary | ICD-10-CM | POA: Diagnosis not present

## 2016-04-23 DIAGNOSIS — R918 Other nonspecific abnormal finding of lung field: Secondary | ICD-10-CM | POA: Insufficient documentation

## 2016-04-23 DIAGNOSIS — R069 Unspecified abnormalities of breathing: Secondary | ICD-10-CM

## 2016-04-23 DIAGNOSIS — L7634 Postprocedural seroma of skin and subcutaneous tissue following other procedure: Secondary | ICD-10-CM | POA: Insufficient documentation

## 2016-04-23 DIAGNOSIS — Z923 Personal history of irradiation: Secondary | ICD-10-CM | POA: Insufficient documentation

## 2016-04-23 HISTORY — DX: Malignant (primary) neoplasm, unspecified: C80.1

## 2016-04-23 LAB — POCT I-STAT CREATININE: CREATININE: 1 mg/dL (ref 0.44–1.00)

## 2016-04-23 MED ORDER — IOPAMIDOL (ISOVUE-370) INJECTION 76%
100.0000 mL | Freq: Once | INTRAVENOUS | Status: AC | PRN
  Administered 2016-04-23: 100 mL via INTRAVENOUS

## 2016-05-23 ENCOUNTER — Ambulatory Visit
Admission: RE | Admit: 2016-05-23 | Discharge: 2016-05-23 | Disposition: A | Discharge status: Home / Self Care | Payer: Medicare HMO | Source: Ambulatory Visit | Attending: Cardiology | Admitting: Cardiology

## 2016-05-23 ENCOUNTER — Encounter: Payer: Self-pay | Admitting: *Deleted

## 2016-05-23 ENCOUNTER — Encounter
Admission: RE | Disposition: A | Discharge status: Home / Self Care | Payer: Self-pay | Source: Ambulatory Visit | Attending: Cardiology

## 2016-05-23 DIAGNOSIS — Z86711 Personal history of pulmonary embolism: Secondary | ICD-10-CM | POA: Diagnosis not present

## 2016-05-23 DIAGNOSIS — K219 Gastro-esophageal reflux disease without esophagitis: Secondary | ICD-10-CM | POA: Diagnosis not present

## 2016-05-23 DIAGNOSIS — G629 Polyneuropathy, unspecified: Secondary | ICD-10-CM | POA: Insufficient documentation

## 2016-05-23 DIAGNOSIS — E785 Hyperlipidemia, unspecified: Secondary | ICD-10-CM | POA: Insufficient documentation

## 2016-05-23 DIAGNOSIS — F419 Anxiety disorder, unspecified: Secondary | ICD-10-CM | POA: Diagnosis not present

## 2016-05-23 DIAGNOSIS — Z7951 Long term (current) use of inhaled steroids: Secondary | ICD-10-CM | POA: Diagnosis not present

## 2016-05-23 DIAGNOSIS — R0789 Other chest pain: Secondary | ICD-10-CM | POA: Diagnosis not present

## 2016-05-23 DIAGNOSIS — Z853 Personal history of malignant neoplasm of breast: Secondary | ICD-10-CM | POA: Diagnosis not present

## 2016-05-23 DIAGNOSIS — E669 Obesity, unspecified: Secondary | ICD-10-CM | POA: Insufficient documentation

## 2016-05-23 DIAGNOSIS — Z79899 Other long term (current) drug therapy: Secondary | ICD-10-CM | POA: Diagnosis not present

## 2016-05-23 HISTORY — DX: Unspecified urinary incontinence: R32

## 2016-05-23 HISTORY — DX: Anxiety disorder, unspecified: F41.9

## 2016-05-23 HISTORY — DX: Neuralgia and neuritis, unspecified: M79.2

## 2016-05-23 HISTORY — DX: Gastro-esophageal reflux disease without esophagitis: K21.9

## 2016-05-23 HISTORY — DX: Pure hypercholesterolemia, unspecified: E78.00

## 2016-05-23 HISTORY — DX: Other pulmonary embolism without acute cor pulmonale: I26.99

## 2016-05-23 HISTORY — PX: CARDIAC CATHETERIZATION: SHX172

## 2016-05-23 SURGERY — LEFT HEART CATH AND CORONARY ANGIOGRAPHY
Anesthesia: Moderate Sedation | Laterality: Left

## 2016-05-23 MED ORDER — FENTANYL CITRATE (PF) 100 MCG/2ML IJ SOLN
INTRAMUSCULAR | Status: DC | PRN
  Administered 2016-05-23: 50 ug via INTRAVENOUS

## 2016-05-23 MED ORDER — MIDAZOLAM HCL 2 MG/2ML IJ SOLN
INTRAMUSCULAR | Status: DC | PRN
Start: 2016-05-23 — End: 2016-05-23
  Administered 2016-05-23: 1 mg via INTRAVENOUS

## 2016-05-23 MED ORDER — FENTANYL CITRATE (PF) 100 MCG/2ML IJ SOLN
INTRAMUSCULAR | Status: AC
Start: 2016-05-23 — End: 2016-05-23
  Filled 2016-05-23: qty 2

## 2016-05-23 MED ORDER — HEPARIN (PORCINE) IN NACL 2-0.9 UNIT/ML-% IJ SOLN
INTRAMUSCULAR | Status: AC
  Filled 2016-05-23: qty 500

## 2016-05-23 MED ORDER — MIDAZOLAM HCL 2 MG/2ML IJ SOLN
INTRAMUSCULAR | Status: AC
  Filled 2016-05-23: qty 2

## 2016-05-23 MED ORDER — SODIUM CHLORIDE 0.9 % IV SOLN
INTRAVENOUS | Status: DC
  Administered 2016-05-23: 07:00:00 via INTRAVENOUS

## 2016-05-23 SURGICAL SUPPLY — 9 items
CATH INFINITI 5FR ANG PIGTAIL (CATHETERS) ×3 IMPLANT
CATH INFINITI 5FR JL4 (CATHETERS) ×3 IMPLANT
CATH INFINITI JR4 5F (CATHETERS) ×3 IMPLANT
DEVICE CLOSURE MYNXGRIP 5F (Vascular Products) ×3 IMPLANT
GUIDEWIRE 3MM J TIP .035 145 (WIRE) ×3 IMPLANT
KIT MANI 3VAL PERCEP (MISCELLANEOUS) ×3 IMPLANT
NEEDLE PERC 18GX7CM (NEEDLE) ×3 IMPLANT
PACK CARDIAC CATH (CUSTOM PROCEDURE TRAY) ×3 IMPLANT
SHEATH AVANTI 5FR X 11CM (SHEATH) ×3 IMPLANT

## 2016-05-23 NOTE — Discharge Instructions (Signed)

## 2016-05-24 ENCOUNTER — Encounter: Payer: Self-pay | Admitting: Cardiology

## 2017-02-06 ENCOUNTER — Other Ambulatory Visit: Payer: Self-pay | Admitting: Internal Medicine

## 2017-02-06 DIAGNOSIS — Z1239 Encounter for other screening for malignant neoplasm of breast: Secondary | ICD-10-CM

## 2017-02-06 DIAGNOSIS — Z853 Personal history of malignant neoplasm of breast: Secondary | ICD-10-CM

## 2017-12-19 ENCOUNTER — Other Ambulatory Visit: Payer: Self-pay | Admitting: Internal Medicine

## 2017-12-23 DIAGNOSIS — C50911 Malignant neoplasm of unspecified site of right female breast: Secondary | ICD-10-CM | POA: Insufficient documentation

## 2017-12-23 NOTE — Progress Notes (Signed)
[place]  Telephone:([phone] [phone]) [phone]  ID: [name] [name] OB: 1950/08/30  MR#: [id number]  TKW#:[id number]  Patient Care Team: Rusty Aus, MD as PCP - General (Internal Medicine)  CHIEF COMPLAINT: h/o angiosarcoma of right breast.  INTERVAL HISTORY: Patient is a 67 year old female who has a history of right breast cancer diagnosed in June 2006.  In August 2014 she developed skin nodules in the right breast mass.  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  Full oncology history as below.  She is referred to the cancer center for continued follow-up of her malignancy.  She is still actively seeing wound care for her right chest wall, but otherwise feels well.  She has no neurologic complaints.  She denies any pain.  She has good appetite and denies weight loss.  She does not complain of weakness or fatigue.  She has no chest pain or shortness of breath.  She denies any nausea, vomiting, constipation, or diarrhea.  She has no urinary complaints.  Patient feels at her baseline offers no specific complaints today.  REVIEW OF SYSTEMS:   Review of Systems  Constitutional: Negative.  Negative for fever, malaise/fatigue and weight loss.  Respiratory: Negative.  Negative for cough and shortness of breath.   Cardiovascular: Negative.  Negative for chest pain and leg swelling.  Gastrointestinal: Negative.  Negative for abdominal pain and constipation.  Genitourinary: Negative.  Negative for dysuria.  Musculoskeletal: Negative.  Negative for back pain.  Skin: Negative.  Negative for rash.  Neurological: Negative.  Negative for sensory change, focal weakness and weakness.  Psychiatric/Behavioral: Negative.  The patient is not nervous/anxious.     As per HPI. Otherwise, a complete review of systems is negative.  PAST MEDICAL HISTORY: Past Medical History:  Diagnosis Date  . Anxiety   .  Cancer [facility])    2006 right side breast ca. 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 GERD (gastroesophageal reflux disease)   . Hypercholesteremia   . Peripheral neuropathic pain   . Pulmonary emboli ([place])   . Urinary incontinence     PAST SURGICAL HISTORY: Right mastectomy  FAMILY HISTORY: Family History  Problem Relation Age of Onset  . Asthma Mother   . COPD Mother   . CVA Father   . Heart attack Father   . Coronary artery disease Father   . Schizophrenia Brother   . Peripheral vascular disease Brother     ADVANCED DIRECTIVES (Y/N):  N  HEALTH MAINTENANCE: Social History   Tobacco Use  . Smoking status: Never Smoker  . Smokeless tobacco: Never Used  Substance Use Topics  . Alcohol use: Never    Frequency: Never  . Drug use: No     Colonoscopy:  PAP:  Bone density:  Lipid panel:  No Known Allergies  Current Outpatient Medications  Medication Sig Dispense Refill  . Cholecalciferol (D-5000) 5000 units TABS Take by mouth every morning.     . Cyanocobalamin (B-12 DOTS) 500 MCG TBDP Take by mouth every morning.     . fluticasone (FLONASE) 50 MCG/ACT nasal spray Place 2 sprays into both nostrils daily.    Marland Kitchen gabapentin (NEURONTIN) 300 MG capsule Take 900 mg by mouth at bedtime.    Marland Kitchen levothyroxine (SYNTHROID, LEVOTHROID) 125 MCG tablet Take 125 mcg by mouth.     Marland Kitchen LORazepam (ATIVAN) 1 MG tablet Take 1 mg by mouth at bedtime.    Marland Kitchen omeprazole (PRILOSEC)  20 MG capsule Take 20 mg by mouth daily.    . simvastatin (ZOCOR) 20 MG tablet Take 20 mg by mouth daily at 6 PM.    . sulfamethoxazole-trimethoprim (BACTRIM DS,SEPTRA DS) 800-160 MG tablet Take 1 tablet by mouth 2 (two) times daily.     . temazepam (RESTORIL) 15 MG capsule Take 15 mg by mouth at bedtime as needed.     Marland Kitchen acetaminophen (TYLENOL) 325 MG tablet Take 325 mg by mouth every 4 (four) hours as needed.    Marland Kitchen HYDROcodone-acetaminophen (NORCO/VICODIN) 5-325 MG tablet 1 tablet every 6 (six) hours as needed.     .  traMADol (ULTRAM) 50 MG tablet Take by mouth every 6 (six) hours as needed.     No current facility-administered medications for this visit.     OBJECTIVE: Vitals:   12/25/17 1350  BP: 125/70  Pulse: 96  Resp: 18  Temp: 97.7 F (36.5 C)     Body mass index is 33.65 kg/m.    ECOG FS:0 - Asymptomatic  General: Well-developed, well-nourished, no acute distress. Eyes: Pink conjunctiva, anicteric sclera. HEENT: Normocephalic, moist mucous membranes, clear oropharnyx. Breast: Right mastectomy, dressing over wound CDI. Lungs: Clear to auscultation bilaterally. Heart: Regular rate and rhythm. No rubs, murmurs, or gallops. Abdomen: Soft, nontender, nondistended. No organomegaly noted, normoactive bowel sounds. Musculoskeletal: No edema, cyanosis, or clubbing. Neuro: Alert, answering all questions appropriately. Cranial nerves grossly intact. Skin: No rashes or petechiae noted. Psych: Normal affect.   LAB RESULTS:  Lab Results  Component Value Date   CREATININE 1.00 04/23/2016    No results found for: WBC, NEUTROABS, HGB, HCT, MCV, PLT   STUDIES: No results found.  ONCOLOGY HISTORY: Patient was initially diagnosed with ER/PR positive HER-2 negative right breast cancer.  She underwent lumpectomy on January 31, 2005 which revealed a 5.5 cm tumor.  Sentinel lymph node biopsy noted a single cluster of tumor cells which was thought to be lymphovascular invasion and not distinct nodal involvement.  She was noted to have a positive margin focally at the skin.  She underwent 4 cycles of adjuvant Adriamycin and Cytoxan followed by 6 weeks of weekly Taxol which was discontinued secondary to DVT and saddle pulmonary embolus.  She then underwent XRT which was completed in March 2007.  She initiated letrozole and completed a total of 5 years in March 2012.  She developed skin nodules and a right breast mass in August 2014 which was initially thought to be recurrent disease.  She initiated neoadjuvant  treatment with carboplatinum and eribulin in September 2014 and completed 5 cycles which was complicated by neuropathy and thrombocytopenia.  She underwent a right mastectomy on September 20, 2013.  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  On October 11, 2013, she appeared to have multifocal recurrence in her skin and subsequently underwent concurrent XRT with Taxol.  She completed her XRT on December 27, 2013 and completed Taxol on February 23, 2014.  Patient had CT scan of chest, abdomen, pelvis on April 07, 2014 that did not reveal any evidence of metastatic or progressive disease.  ASSESSMENT: h/o angiosarcoma of right breast  PLAN:    1. h/o angiosarcoma of right breast: No evidence of disease.  Patient's most recent CT scan at [facility] on Dec 10, 2017 did not reveal any evidence of recurrent or progressive disease.  She is noted to have small pulmonary nodules of 3 mm in size, but these are stable and unchanged.  No intervention is  needed at this time.  Patient will require mammogram of her left breast in the next 1 to 2 weeks.  She will then follow-up in 6 months for further evaluation.  Will consider repeat imaging at that time. 2.  History of right breast cancer: Patient completed 5 years of letrozole in March 2012.  No intervention is needed. 3.  History of pulmonary embolism/DVT: Patient is no longer taking anticoagulation.  No evidence of residual clot on CT scan completed at [facility]. 4.  Wound care: Patient continues to see wound care at [facility], but is interested in transferring to [place].  Patient states she will call when she is ready to make this move.  Approximately 60 minutes was spent in discussion of which greater than 50% was consultation.   Patient expressed understanding and was in agreement with this plan. She also understands that She can call clinic at any time with any questions, concerns, or complaints.   Cancer  Staging No matching staging information was found for the patient.  Lloyd Huger, MD   12/31/2017 7:36 AM

## 2017-12-25 ENCOUNTER — Other Ambulatory Visit: Payer: Self-pay | Admitting: Internal Medicine

## 2017-12-25 ENCOUNTER — Encounter: Payer: Self-pay | Admitting: Oncology

## 2017-12-25 ENCOUNTER — Inpatient Hospital Stay: Payer: Medicare Other | Attending: Oncology | Admitting: Oncology

## 2017-12-25 ENCOUNTER — Other Ambulatory Visit: Payer: Self-pay

## 2017-12-25 DIAGNOSIS — Z9221 Personal history of antineoplastic chemotherapy: Secondary | ICD-10-CM | POA: Diagnosis not present

## 2017-12-25 DIAGNOSIS — Z923 Personal history of irradiation: Secondary | ICD-10-CM | POA: Diagnosis not present

## 2017-12-25 DIAGNOSIS — Z9011 Acquired absence of right breast and nipple: Secondary | ICD-10-CM | POA: Diagnosis not present

## 2017-12-25 DIAGNOSIS — C50911 Malignant neoplasm of unspecified site of right female breast: Secondary | ICD-10-CM

## 2017-12-25 DIAGNOSIS — Z853 Personal history of malignant neoplasm of breast: Secondary | ICD-10-CM | POA: Insufficient documentation

## 2017-12-25 DIAGNOSIS — Z1239 Encounter for other screening for malignant neoplasm of breast: Secondary | ICD-10-CM

## 2017-12-25 NOTE — Progress Notes (Signed)
Here for new pt evaluation.  

## 2018-01-05 ENCOUNTER — Other Ambulatory Visit: Payer: Self-pay | Admitting: *Deleted

## 2018-01-05 ENCOUNTER — Ambulatory Visit
Admission: RE | Admit: 2018-01-05 | Discharge: 2018-01-05 | Disposition: A | Discharge status: Home / Self Care | Payer: Medicare Other | Source: Ambulatory Visit | Attending: Oncology | Admitting: Oncology

## 2018-01-05 ENCOUNTER — Ambulatory Visit
Admission: RE | Admit: 2018-01-05 | Discharge: 2018-01-05 | Disposition: A | Discharge status: Home / Self Care | Payer: Medicare Other | Source: Ambulatory Visit | Attending: Internal Medicine | Admitting: Internal Medicine

## 2018-01-05 ENCOUNTER — Encounter: Payer: Self-pay | Admitting: Radiology

## 2018-01-05 DIAGNOSIS — Z853 Personal history of malignant neoplasm of breast: Secondary | ICD-10-CM | POA: Diagnosis present

## 2018-01-05 DIAGNOSIS — C50911 Malignant neoplasm of unspecified site of right female breast: Secondary | ICD-10-CM | POA: Insufficient documentation

## 2018-01-05 DIAGNOSIS — R918 Other nonspecific abnormal finding of lung field: Secondary | ICD-10-CM

## 2018-01-05 DIAGNOSIS — Z1231 Encounter for screening mammogram for malignant neoplasm of breast: Secondary | ICD-10-CM | POA: Diagnosis present

## 2018-01-05 DIAGNOSIS — Z1239 Encounter for other screening for malignant neoplasm of breast: Secondary | ICD-10-CM

## 2018-01-05 HISTORY — DX: Personal history of antineoplastic chemotherapy: Z92.21

## 2018-01-05 HISTORY — DX: Personal history of irradiation: Z92.3

## 2018-06-22 ENCOUNTER — Ambulatory Visit: Admission: RE | Admit: 2018-06-22 | Payer: Medicare Other | Source: Ambulatory Visit

## 2018-06-28 ENCOUNTER — Other Ambulatory Visit: Payer: Self-pay | Admitting: Oncology

## 2018-06-28 NOTE — Progress Notes (Signed)
[name] [name]  Telephone:(336) [phone] Fax:(336) [phone]  ID: AMAYRA KIEDROWSKI OB: May 13, 1951  MR#: [id number]  AOZ#:[id number]  Patient Care Team: Rusty Aus, MD as PCP - General (Internal Medicine)  CHIEF COMPLAINT: h/o angiosarcoma of right breast.  INTERVAL HISTORY: Patient returns to clinic today for routine 71-monthevaluation.  [facility]  She otherwise feels well and is asymptomatic.  She has no neurologic complaints.  She denies any pain.  She has a good appetite and denies weight loss.  She does not complain of weakness or fatigue.  She has no chest pain or shortness of breath.  She denies any nausea, vomiting, constipation, or diarrhea.  She has no urinary complaints.  Patient feels at her baseline offers no specific complaints today.  REVIEW OF SYSTEMS:   Review of Systems  Constitutional: Negative.  Negative for fever, malaise/fatigue and weight loss.  Respiratory: Negative.  Negative for cough and shortness of breath.   Cardiovascular: Negative.  Negative for chest pain and leg swelling.  Gastrointestinal: Negative.  Negative for abdominal pain and constipation.  Genitourinary: Negative.  Negative for dysuria.  Musculoskeletal: Negative.  Negative for back pain.  Skin: Negative.  Negative for rash.  Neurological: Negative.  Negative for sensory change, focal weakness, weakness and headaches.  Psychiatric/Behavioral: Negative.  The patient is not nervous/anxious.     As per HPI. Otherwise, a complete review of systems is negative.  PAST MEDICAL HISTORY: Past Medical History:  Diagnosis Date  . Anxiety   . Cancer ([facility]    2006 right side breast ca. 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 GERD (gastroesophageal reflux disease)   . Hypercholesteremia   . Peripheral neuropathic pain   . Personal history of chemotherapy    2006/2014  . Personal history of radiation therapy    2006/2014  [place]   . Urinary [place]ence     PAST SURGICAL HISTORY: Right mastectomy  FAMILY HISTORY: Family History  Problem Relation Age of Onset  . Asthma Mother   . COPD Mother   . CVA Father   . Heart attack Father   . Coronary artery disease Father   . Schizophrenia Brother   . Peripheral vascular disease Brother   . Breast cancer Neg Hx     ADVANCED DIRECTIVES (Y/N):  N  HEALTH MAINTENANCE: Social History   Tobacco Use  . Smoking status: Never Smoker  . Smokeless tobacco: Never Used  Substance Use Topics  . Alcohol use: Never    Frequency: Never  . Drug use: No     Colonoscopy:  PAP:  Bone density:  Lipid panel:  No Known Allergies  Current Outpatient Medications  Medication Sig Dispense Refill  . acetaminophen (TYLENOL) 325 MG tablet Take 325 mg by mouth every 4 (four) hours as needed.    . Cholecalciferol (D-5000) 5000 units TABS Take by mouth every morning.     . Cyanocobalamin (B-12 DOTS) 500 MCG TBDP Take by mouth every morning.     . fluticasone (FLONASE) 50 MCG/ACT nasal spray Place 2 sprays into both nostrils daily.    .Marland Kitchengabapentin (NEURONTIN) 300 MG capsule Take 900 mg by mouth at bedtime.    .Marland KitchenHYDROcodone-acetaminophen (NORCO/VICODIN) 5-325 MG tablet 1 tablet every 6 (six) hours as needed.     .Marland Kitchenlevothyroxine (SYNTHROID, LEVOTHROID) 125 MCG tablet Take 125 mcg by mouth.     .Marland KitchenLORazepam (  ATIVAN) 1 MG tablet Take 1 mg by mouth at bedtime.    Marland Kitchen omeprazole (PRILOSEC) 20 MG capsule Take 20 mg by mouth daily.    . simvastatin (ZOCOR) 20 MG tablet Take 20 mg by mouth daily at 6 PM.    . temazepam (RESTORIL) 15 MG capsule Take 15 mg by mouth at bedtime as needed.     . traMADol (ULTRAM) 50 MG tablet Take by mouth every 6 (six) hours as needed.     No current facility-administered medications for this visit.     OBJECTIVE: Vitals:   06/29/18 1501  BP: 126/79  Pulse: (!) 102  Temp: 97.8 F (36.6 C)     Body mass index is  33.7 kg/m.    ECOG FS:0 - Asymptomatic  General: Well-developed, well-nourished, no acute distress. Eyes: Pink conjunctiva, anicteric sclera. HEENT: Normocephalic, moist mucous membranes. Breast: Right mastectomy with with open wound over surgical scar covered by dressing.  Left breast and axilla without lumps or masses. Lungs: Clear to auscultation bilaterally. Heart: Regular rate and rhythm. No rubs, murmurs, or gallops. Abdomen: Soft, nontender, nondistended. No organomegaly noted, normoactive bowel sounds. Musculoskeletal: No edema, cyanosis, or clubbing. Neuro: Alert, answering all questions appropriately. Cranial nerves grossly intact. Skin: No rashes or petechiae noted. Psych: Normal affect.   LAB RESULTS:  Lab Results  Component Value Date   CREATININE 1.00 04/23/2016    No results found for: WBC, NEUTROABS, HGB, HCT, MCV, PLT   STUDIES: No results found.  ONCOLOGY HISTORY: Patient was initially diagnosed with ER/PR positive HER-2 negative right breast cancer.  She underwent lumpectomy on January 31, 2005 which revealed a 5.5 cm tumor.  Sentinel lymph node biopsy noted a single cluster of tumor cells which was thought to be lymphovascular invasion and not distinct nodal involvement.  She was noted to have a positive margin focally at the skin.  She underwent 4 cycles of adjuvant Adriamycin and Cytoxan followed by 6 weeks of weekly Taxol which was discontinued secondary to DVT and saddle pulmonary embolus.  She then underwent XRT which was completed in March 2007.  She initiated letrozole and completed a total of 5 years in March 2012.  She developed skin nodules and a right breast mass in August 2014 which was initially thought to be recurrent disease.  She initiated neoadjuvant treatment with carboplatinum and eribulin in September 2014 and completed 5 cycles which was complicated by neuropathy and thrombocytopenia.  She underwent a right mastectomy on September 20, 2013.  Final  pathology was more consistent with radiation-induced angiosarcoma.  On October 11, 2013, she appeared to have multifocal recurrence in her skin and subsequently underwent concurrent XRT with Taxol.  She completed her XRT on December 27, 2013 and completed Taxol on February 23, 2014.  Patient had CT scan of chest, abdomen, pelvis on April 07, 2014 that did not reveal any evidence of metastatic or progressive disease.  ASSESSMENT: h/o angiosarcoma of right breast  PLAN:    1. h/o angiosarcoma of right breast: No evidence of disease.  Patient's most recent CT scan at [facility] on Dec 10, 2017 did not reveal any evidence of recurrent or progressive disease.  She is noted to have small pulmonary nodules of 3 mm in size, but these are stable and unchanged.  No intervention is needed at this time.  Return to clinic in 6 months for routine evaluation.  2.  History of right breast cancer: Patient completed 5 years of letrozole in March 2012.  No intervention is needed. Patient's most recent mammogram on January 05, 2018 was reported as BI-RADS 1.  Repeat in June 2020.   3.  History of pulmonary embolism/DVT: Patient is no longer taking anticoagulation.  No evidence of residual clot on CT scan completed at [facility]. 4.  Wound care: Continue wound care clinic at [facility].  Patient expressed understanding and was in agreement with this plan. She also understands that She can call clinic at any time with any questions, concerns, or complaints.   Cancer Staging No matching staging information was found for the patient.  Lloyd Huger, MD   07/02/2018 6:31 AM

## 2018-06-29 ENCOUNTER — Other Ambulatory Visit: Payer: Self-pay

## 2018-06-29 ENCOUNTER — Inpatient Hospital Stay: Payer: Medicare Other | Attending: Oncology | Admitting: Oncology

## 2018-06-29 VITALS — BP 126/79 | HR 102 | Temp 97.8°F | Wt 214.7 lb

## 2018-06-29 DIAGNOSIS — Z923 Personal history of irradiation: Secondary | ICD-10-CM | POA: Diagnosis not present

## 2018-06-29 DIAGNOSIS — Z9223 Personal history of estrogen therapy: Secondary | ICD-10-CM

## 2018-06-29 DIAGNOSIS — Z79899 Other long term (current) drug therapy: Secondary | ICD-10-CM

## 2018-06-29 DIAGNOSIS — Z853 Personal history of malignant neoplasm of breast: Secondary | ICD-10-CM

## 2018-06-29 DIAGNOSIS — Z9011 Acquired absence of right breast and nipple: Secondary | ICD-10-CM | POA: Diagnosis not present

## 2018-06-29 DIAGNOSIS — Z86711 Personal history of pulmonary embolism: Secondary | ICD-10-CM

## 2018-06-29 DIAGNOSIS — Z170 Estrogen receptor positive status [ER+]: Secondary | ICD-10-CM

## 2018-06-29 DIAGNOSIS — R918 Other nonspecific abnormal finding of lung field: Secondary | ICD-10-CM

## 2018-06-29 DIAGNOSIS — C50911 Malignant neoplasm of unspecified site of right female breast: Secondary | ICD-10-CM

## 2018-06-29 DIAGNOSIS — Z86718 Personal history of other venous thrombosis and embolism: Secondary | ICD-10-CM

## 2018-06-29 NOTE — Progress Notes (Signed)
Patient is here for follow up angiosarcoma of right breast. Patient stated that she has constant throbbing pain and pressure on her right beast wound. Patient continues to go to [facility] for wound care very 4-6 weeks and was told last time that her wound was getting better.

## 2018-08-18 ENCOUNTER — Emergency Department: Payer: Medicare Other

## 2018-08-18 ENCOUNTER — Inpatient Hospital Stay
Admission: EM | Admit: 2018-08-18 | Discharge: 2018-08-21 | DRG: 166 | Disposition: A | Discharge status: Home / Self Care | Payer: Medicare Other | Attending: Internal Medicine | Admitting: Internal Medicine

## 2018-08-18 ENCOUNTER — Encounter: Payer: Self-pay | Admitting: *Deleted

## 2018-08-18 ENCOUNTER — Other Ambulatory Visit: Payer: Self-pay

## 2018-08-18 DIAGNOSIS — I82432 Acute embolism and thrombosis of left popliteal vein: Secondary | ICD-10-CM | POA: Diagnosis present

## 2018-08-18 DIAGNOSIS — I82452 Acute embolism and thrombosis of left peroneal vein: Secondary | ICD-10-CM | POA: Diagnosis present

## 2018-08-18 DIAGNOSIS — T8189XD Other complications of procedures, not elsewhere classified, subsequent encounter: Secondary | ICD-10-CM

## 2018-08-18 DIAGNOSIS — Z79899 Other long term (current) drug therapy: Secondary | ICD-10-CM

## 2018-08-18 DIAGNOSIS — Z823 Family history of stroke: Secondary | ICD-10-CM

## 2018-08-18 DIAGNOSIS — Z9049 Acquired absence of other specified parts of digestive tract: Secondary | ICD-10-CM | POA: Diagnosis not present

## 2018-08-18 DIAGNOSIS — K219 Gastro-esophageal reflux disease without esophagitis: Secondary | ICD-10-CM | POA: Diagnosis present

## 2018-08-18 DIAGNOSIS — E669 Obesity, unspecified: Secondary | ICD-10-CM | POA: Diagnosis present

## 2018-08-18 DIAGNOSIS — E039 Hypothyroidism, unspecified: Secondary | ICD-10-CM | POA: Diagnosis present

## 2018-08-18 DIAGNOSIS — Z85828 Personal history of other malignant neoplasm of skin: Secondary | ICD-10-CM

## 2018-08-18 DIAGNOSIS — G629 Polyneuropathy, unspecified: Secondary | ICD-10-CM | POA: Diagnosis present

## 2018-08-18 DIAGNOSIS — Z923 Personal history of irradiation: Secondary | ICD-10-CM

## 2018-08-18 DIAGNOSIS — Z66 Do not resuscitate: Secondary | ICD-10-CM | POA: Diagnosis present

## 2018-08-18 DIAGNOSIS — J9601 Acute respiratory failure with hypoxia: Secondary | ICD-10-CM | POA: Diagnosis present

## 2018-08-18 DIAGNOSIS — I2602 Saddle embolus of pulmonary artery with acute cor pulmonale: Principal | ICD-10-CM | POA: Diagnosis present

## 2018-08-18 DIAGNOSIS — S21101D Unspecified open wound of right front wall of thorax without penetration into thoracic cavity, subsequent encounter: Secondary | ICD-10-CM | POA: Diagnosis not present

## 2018-08-18 DIAGNOSIS — E785 Hyperlipidemia, unspecified: Secondary | ICD-10-CM | POA: Diagnosis present

## 2018-08-18 DIAGNOSIS — I2699 Other pulmonary embolism without acute cor pulmonale: Secondary | ICD-10-CM | POA: Diagnosis not present

## 2018-08-18 DIAGNOSIS — F419 Anxiety disorder, unspecified: Secondary | ICD-10-CM | POA: Diagnosis present

## 2018-08-18 DIAGNOSIS — I2692 Saddle embolus of pulmonary artery without acute cor pulmonale: Secondary | ICD-10-CM | POA: Diagnosis not present

## 2018-08-18 DIAGNOSIS — Y836 Removal of other organ (partial) (total) as the cause of abnormal reaction of the patient, or of later complication, without mention of misadventure at the time of the procedure: Secondary | ICD-10-CM | POA: Diagnosis present

## 2018-08-18 DIAGNOSIS — Z79890 Hormone replacement therapy (postmenopausal): Secondary | ICD-10-CM

## 2018-08-18 DIAGNOSIS — Z853 Personal history of malignant neoplasm of breast: Secondary | ICD-10-CM | POA: Diagnosis not present

## 2018-08-18 DIAGNOSIS — E7800 Pure hypercholesterolemia, unspecified: Secondary | ICD-10-CM | POA: Diagnosis present

## 2018-08-18 DIAGNOSIS — Z9011 Acquired absence of right breast and nipple: Secondary | ICD-10-CM | POA: Diagnosis not present

## 2018-08-18 DIAGNOSIS — Z825 Family history of asthma and other chronic lower respiratory diseases: Secondary | ICD-10-CM

## 2018-08-18 DIAGNOSIS — Z86711 Personal history of pulmonary embolism: Secondary | ICD-10-CM

## 2018-08-18 DIAGNOSIS — Z818 Family history of other mental and behavioral disorders: Secondary | ICD-10-CM

## 2018-08-18 DIAGNOSIS — Z9221 Personal history of antineoplastic chemotherapy: Secondary | ICD-10-CM | POA: Diagnosis not present

## 2018-08-18 DIAGNOSIS — Z6834 Body mass index (BMI) 34.0-34.9, adult: Secondary | ICD-10-CM

## 2018-08-18 DIAGNOSIS — C50919 Malignant neoplasm of unspecified site of unspecified female breast: Secondary | ICD-10-CM | POA: Diagnosis not present

## 2018-08-18 DIAGNOSIS — Z8249 Family history of ischemic heart disease and other diseases of the circulatory system: Secondary | ICD-10-CM | POA: Diagnosis not present

## 2018-08-18 DIAGNOSIS — Z79891 Long term (current) use of opiate analgesic: Secondary | ICD-10-CM

## 2018-08-18 DIAGNOSIS — R55 Syncope and collapse: Secondary | ICD-10-CM | POA: Diagnosis present

## 2018-08-18 DIAGNOSIS — I82402 Acute embolism and thrombosis of unspecified deep veins of left lower extremity: Secondary | ICD-10-CM | POA: Diagnosis not present

## 2018-08-18 DIAGNOSIS — Z95828 Presence of other vascular implants and grafts: Secondary | ICD-10-CM | POA: Diagnosis not present

## 2018-08-18 LAB — CBC
HEMATOCRIT: 45.3 % (ref 36.0–46.0)
Hemoglobin: 14.8 g/dL (ref 12.0–15.0)
MCH: 28.6 pg (ref 26.0–34.0)
MCHC: 32.7 g/dL (ref 30.0–36.0)
MCV: 87.5 fL (ref 80.0–100.0)
NRBC: 0 % (ref 0.0–0.2)
Platelets: 174 10*3/uL (ref 150–400)
RBC: 5.18 MIL/uL — AB (ref 3.87–5.11)
RDW: 13.3 % (ref 11.5–15.5)
WBC: 7.8 10*3/uL (ref 4.0–10.5)

## 2018-08-18 LAB — URINALYSIS, COMPLETE (UACMP) WITH MICROSCOPIC
Bilirubin Urine: NEGATIVE
GLUCOSE, UA: NEGATIVE mg/dL
KETONES UR: NEGATIVE mg/dL
LEUKOCYTES UA: NEGATIVE
Nitrite: NEGATIVE
PH: 7 (ref 5.0–8.0)
PROTEIN: NEGATIVE mg/dL
Specific Gravity, Urine: 1.014 (ref 1.005–1.030)

## 2018-08-18 LAB — APTT
aPTT: 33 seconds (ref 24–36)
aPTT: 109 seconds — ABNORMAL HIGH (ref 24–36)

## 2018-08-18 LAB — MRSA PCR SCREENING: MRSA by PCR: NEGATIVE

## 2018-08-18 LAB — PROTIME-INR
INR: 1.03
Prothrombin Time: 13.4 seconds (ref 11.4–15.2)

## 2018-08-18 LAB — GLUCOSE, CAPILLARY: Glucose-Capillary: 122 mg/dL — ABNORMAL HIGH (ref 70–99)

## 2018-08-18 LAB — BASIC METABOLIC PANEL
ANION GAP: 10 (ref 5–15)
BUN: 13 mg/dL (ref 8–23)
CHLORIDE: 106 mmol/L (ref 98–111)
CO2: 24 mmol/L (ref 22–32)
CREATININE: 1.04 mg/dL — AB (ref 0.44–1.00)
Calcium: 8.6 mg/dL — ABNORMAL LOW (ref 8.9–10.3)
GFR calc non Af Amer: 55 mL/min — ABNORMAL LOW (ref >60)
Glucose, Bld: 175 mg/dL — ABNORMAL HIGH (ref 70–99)
POTASSIUM: 3.7 mmol/L (ref 3.5–5.1)
Sodium: 140 mmol/L (ref 135–145)

## 2018-08-18 LAB — TROPONIN I
Troponin I: 0.12 ng/mL (ref <0.03)
Troponin I: 0.9 ng/mL (ref <0.03)

## 2018-08-18 MED ORDER — HYDROCODONE-ACETAMINOPHEN 5-325 MG PO TABS: 1.0000 | ORAL_TABLET | ORAL | Status: DC | PRN

## 2018-08-18 MED ORDER — ACETAMINOPHEN 500 MG PO TABS: 500.0000 mg | ORAL_TABLET | ORAL | Status: DC | PRN

## 2018-08-18 MED ORDER — LEVOTHYROXINE SODIUM 25 MCG PO TABS
125.0000 ug | ORAL_TABLET | Freq: Every day | ORAL | Status: DC
  Administered 2018-08-19 – 2018-08-21 (×3): 125 ug via ORAL
  Filled 2018-08-18 (×3): qty 1

## 2018-08-18 MED ORDER — DOCUSATE SODIUM 100 MG PO CAPS: 100.0000 mg | ORAL_CAPSULE | Freq: Two times a day (BID) | ORAL | Status: DC | PRN

## 2018-08-18 MED ORDER — FLUTICASONE PROPIONATE 50 MCG/ACT NA SUSP
2.0000 | Freq: Every day | NASAL | Status: DC | PRN
  Filled 2018-08-18: qty 16

## 2018-08-18 MED ORDER — SODIUM CHLORIDE 0.9 % IV BOLUS
500.0000 mL | Freq: Once | INTRAVENOUS | Status: AC
  Administered 2018-08-18: 500 mL via INTRAVENOUS

## 2018-08-18 MED ORDER — ALTEPLASE (PULMONARY EMBOLISM) INFUSION
100.0000 mg | Freq: Once | INTRAVENOUS | Status: AC
  Administered 2018-08-18: 15 mg via INTRAVENOUS
  Filled 2018-08-18: qty 100

## 2018-08-18 MED ORDER — LORATADINE 10 MG PO TABS
10.0000 mg | ORAL_TABLET | Freq: Every day | ORAL | Status: DC
  Administered 2018-08-19: 10 mg via ORAL
  Filled 2018-08-18: qty 1

## 2018-08-18 MED ORDER — TEMAZEPAM 7.5 MG PO CAPS: 15.0000 mg | ORAL_CAPSULE | Freq: Every evening | ORAL | Status: DC | PRN

## 2018-08-18 MED ORDER — HYDRALAZINE HCL 20 MG/ML IJ SOLN: 10.0000 mg | INTRAMUSCULAR | Status: DC | PRN

## 2018-08-18 MED ORDER — HEPARIN SODIUM (PORCINE) 5000 UNIT/ML IJ SOLN
4000.0000 [IU] | Freq: Once | INTRAMUSCULAR | Status: DC
  Filled 2018-08-18: qty 1

## 2018-08-18 MED ORDER — VITAMIN D 25 MCG (1000 UNIT) PO TABS
5000.0000 [IU] | ORAL_TABLET | Freq: Every morning | ORAL | Status: DC
  Administered 2018-08-19 – 2018-08-21 (×3): 5000 [IU] via ORAL
  Filled 2018-08-18 (×6): qty 5

## 2018-08-18 MED ORDER — GABAPENTIN 300 MG PO CAPS
900.0000 mg | ORAL_CAPSULE | Freq: Every day | ORAL | Status: DC
  Administered 2018-08-18 – 2018-08-20 (×3): 900 mg via ORAL
  Filled 2018-08-18 (×3): qty 3

## 2018-08-18 MED ORDER — VITAMIN B-12 1000 MCG PO TABS
500.0000 ug | ORAL_TABLET | Freq: Every morning | ORAL | Status: DC
  Administered 2018-08-19 – 2018-08-21 (×3): 500 ug via ORAL
  Filled 2018-08-18 (×3): qty 1

## 2018-08-18 MED ORDER — IOHEXOL 350 MG/ML SOLN
75.0000 mL | Freq: Once | INTRAVENOUS | Status: AC | PRN
  Administered 2018-08-18: 75 mL via INTRAVENOUS

## 2018-08-18 MED ORDER — TRAMADOL HCL 50 MG PO TABS: 50.0000 mg | ORAL_TABLET | Freq: Four times a day (QID) | ORAL | Status: DC | PRN

## 2018-08-18 MED ORDER — HEPARIN (PORCINE) 25000 UT/250ML-% IV SOLN: 1500.0000 [IU]/h | INTRAVENOUS | Status: DC

## 2018-08-18 MED ORDER — ALTEPLASE 100 MG IV SOLR: 100.0000 mg | Freq: Once | INTRAVENOUS | Status: DC

## 2018-08-18 MED ORDER — METRONIDAZOLE 500 MG PO TABS
500.0000 mg | ORAL_TABLET | Freq: Every day | ORAL | Status: DC
  Administered 2018-08-18 – 2018-08-19 (×2): 500 mg via ORAL
  Filled 2018-08-18 (×2): qty 1

## 2018-08-18 MED ORDER — PANTOPRAZOLE SODIUM 40 MG PO TBEC
40.0000 mg | DELAYED_RELEASE_TABLET | Freq: Every day | ORAL | Status: DC
  Administered 2018-08-18 – 2018-08-21 (×4): 40 mg via ORAL
  Filled 2018-08-18 (×4): qty 1

## 2018-08-18 MED ORDER — SODIUM CHLORIDE 0.9% FLUSH
3.0000 mL | Freq: Once | INTRAVENOUS | Status: AC
  Administered 2018-08-18: 10 mL via INTRAVENOUS

## 2018-08-18 MED ORDER — SIMVASTATIN 20 MG PO TABS
20.0000 mg | ORAL_TABLET | Freq: Every day | ORAL | Status: DC
  Administered 2018-08-18 – 2018-08-20 (×3): 20 mg via ORAL
  Filled 2018-08-18 (×3): qty 1

## 2018-08-18 MED ORDER — HEPARIN SODIUM (PORCINE) 5000 UNIT/ML IJ SOLN: 5500.0000 [IU] | Freq: Once | INTRAMUSCULAR | Status: DC

## 2018-08-18 MED ORDER — SODIUM CHLORIDE 0.9 % IV SOLN
250.0000 mL | Freq: Once | INTRAVENOUS | Status: AC
  Administered 2018-08-18: 250 mL via INTRAVENOUS

## 2018-08-18 NOTE — H&P (Signed)
[facility] [place] NAME: [name] [name]    MR#:  [id number]  DATE OF BIRTH:  11-Apr-1951  DATE OF ADMISSION:  08/18/2018  PRIMARY CARE PHYSICIAN: Rusty Aus, MD   REQUESTING/REFERRING PHYSICIAN: Quale  CHIEF COMPLAINT:   Chief Complaint  Patient presents with  . Near Syncope    HISTORY OF PRESENT ILLNESS: [name] [name]  is a 68 y.o. female with a known history of anxiety, breast cancer status post mastectomy and radiation, gastroesophageal reflux disease, hypercholesterolemia, peripheral neuropathy, pulmonary embolism-she was on anticoagulation for many years and stopped 2 to 3 years ago. For last 1 to 2 days she has significant shortness of breath on minimal exertion which is new for hers and she also has some pain in her chest so came to emergency room She was noted to have a saddle pulmonary embolism with significant right heart strain per CT scan angiogram so ER physician discussed with intensivist Dr. Alva Garnet.  He suggested to start on IV TPA and admit to stepdown unit for monitoring. When I saw the patient ( 6 pm), she was already started on TPA and the plan is to start on heparin drip after the TPA is done.  Patient denies any other complaints.  PAST MEDICAL HISTORY:   Past Medical History:  Diagnosis Date  . Anxiety   . Cancer [facility])    2006 right side breast ca. 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 GERD (gastroesophageal reflux disease)   . Hypercholesteremia   . Peripheral neuropathic pain   . Personal history of chemotherapy    2006/2014  . Personal history of radiation therapy    2006/2014  . Pulmonary emboli ([place])   . Urinary incontinence     PAST SURGICAL HISTORY:  Past Surgical History:  Procedure Laterality Date  . APPENDECTOMY    . CARDIAC CATHETERIZATION Left 05/23/2016   Procedure: Left Heart Cath and Coronary Angiography;  Surgeon: Isaias Cowman, MD;  Location: [facility];  Service:  Cardiovascular;  Laterality: Left;  Mar[facility] Kitchen MASTECTOMY Right 2006/2014  . rt breast removal     cancer    SOCIAL HISTORY:  Social History   Tobacco Use  . Smoking status: Never Smoker  . Smokeless tobacco: Never Used  Substance Use Topics  . Alcohol use: Never    Frequency: Never    FAMILY HISTORY:  Family History  Problem Relation Age of Onset  . Asthma Mother   . COPD Mother   . CVA Father   . Heart attack Father   . Coronary artery disease Father   . Schizophrenia Brother   . Peripheral vascular disease Brother   . Breast cancer Neg Hx     DRUG ALLERGIES: No Known Allergies  REVIEW OF SYSTEMS:   CONSTITUTIONAL: No fever, fatigue or weakness.  EYES: No blurred or double vision.  EARS, NOSE, AND THROAT: No tinnitus or ear pain.  RESPIRATORY: No cough, have shortness of breath, wheezing or hemoptysis.  CARDIOVASCULAR: No chest pain, orthopnea, edema.  GASTROINTESTINAL: No nausea, vomiting, diarrhea or abdominal pain.  GENITOURINARY: No dysuria, hematuria.  ENDOCRINE: No polyuria, nocturia,  HEMATOLOGY: No anemia, easy bruising or bleeding SKIN: No rash or lesion. MUSCULOSKELETAL: No joint pain or arthritis.   NEUROLOGIC: No tingling, numbness, weakness.  PSYCHIATRY: No anxiety or depression.   MEDICATIONS AT HOME:  Prior to Admission medications   Medication Sig Start Date End Date Taking? Authorizing Provider  Cholecalciferol (D-5000) 5000 units  TABS Take by mouth every morning.    Yes [provider]  Cyanocobalamin (B-12 DOTS) 500 MCG TBDP Take by mouth every morning.    Yes [provider]  gabapentin (NEURONTIN) 300 MG capsule Take 900 mg by mouth at bedtime.   Yes [provider]  levothyroxine (SYNTHROID, LEVOTHROID) 125 MCG tablet Take 125 mcg by mouth.  12/19/17 12/19/18 Yes [provider]  LORazepam (ATIVAN) 1 MG tablet Take 1 mg by mouth at bedtime.   Yes [provider]  metroNIDAZOLE (FLAGYL) 500 MG tablet Take  500 mg by mouth daily. Crush tablet into fine powder, sprinkle on wound beds with dressing change 08/04/18 09/18/18 Yes [provider]  omeprazole (PRILOSEC) 20 MG capsule Take 20 mg by mouth daily.   Yes [provider]  simvastatin (ZOCOR) 20 MG tablet Take 20 mg by mouth daily at 6 PM.   Yes [provider]  temazepam (RESTORIL) 15 MG capsule Take 15 mg by mouth at bedtime as needed for sleep. 08/11/18  Yes [provider]  traMADol (ULTRAM) 50 MG tablet Take by mouth every 6 (six) hours as needed.   Yes [provider]  acetaminophen (TYLENOL) 325 MG tablet Take 325 mg by mouth every 4 (four) hours as needed.    [provider]  cetirizine (ZYRTEC) 10 MG tablet Take 10 mg by mouth daily.    [provider]  fluticasone (FLONASE) 50 MCG/ACT nasal spray Place 2 sprays into both nostrils daily.    [provider]  HYDROcodone-acetaminophen (NORCO/VICODIN) 5-325 MG tablet 1 tablet every 6 (six) hours as needed.  12/19/17   [provider]  temazepam (RESTORIL) 15 MG capsule Take 15 mg by mouth at bedtime as needed.  12/19/17 06/29/18  [provider]      PHYSICAL EXAMINATION:   VITAL SIGNS: Blood pressure 134/88, pulse (!) 118, temperature 98 F (36.7 C), temperature source Oral, resp. rate 12, height 5\' 7"  (1.702 m), weight 103.9 kg, SpO2 97 %.  GENERAL:  68 y.o.-year-old patient lying in the bed with no acute distress.  EYES: Pupils equal, round, reactive to light and accommodation. No scleral icterus. Extraocular muscles intact.  HEENT: Head atraumatic, normocephalic. Oropharynx and nasopharynx clear.  NECK:  Supple, no jugular venous distention. No thyroid enlargement, no tenderness.  LUNGS: Normal breath sounds bilaterally, no wheezing, rales,rhonchi or crepitation. No use of accessory muscles of respiration.  On right side upper chest her chest wall is very hard, almost "wood like" with fused all tissues  and there is a hole in her right axilla which is packed by dressing.  No active secretions.  As per patient this is chronic since she had mastectomy and then radiations. CARDIOVASCULAR: S1, S2 normal. No murmurs, rubs, or gallops.  ABDOMEN: Soft, nontender, nondistended. Bowel sounds present. No organomegaly or mass.  EXTREMITIES: No pedal edema, cyanosis, or clubbing.  Some swelling on right lower extremity compared to left NEUROLOGIC: Cranial nerves II through XII are intact. Muscle strength 4/5 in all extremities. Sensation intact. Gait not checked.  PSYCHIATRIC: The patient is alert and oriented x 3.  SKIN: No obvious rash, lesion, or ulcer.   LABORATORY PANEL:   CBC Recent Labs  Lab 08/18/18 1509  WBC 7.8  HGB 14.8  HCT 45.3  PLT 174  MCV 87.5  MCH 28.6  MCHC 32.7  RDW 13.3   ------------------------------------------------------------------------------------------------------------------  Chemistries  Recent Labs  Lab 08/18/18 1509  NA 140  K 3.7  CL 106  CO2 24  GLUCOSE 175*  BUN 13  CREATININE 1.04*  CALCIUM 8.6*   ------------------------------------------------------------------------------------------------------------------ estimated creatinine clearance is 63.3 mL/min (A) (by C-G formula based on SCr of 1.04 mg/dL (H)). ------------------------------------------------------------------------------------------------------------------ No results for input(s): TSH, T4TOTAL, T3FREE, THYROIDAB in the last 72 hours.  Invalid input(s): FREET3   Coagulation profile Recent Labs  Lab 08/18/18 1509  INR 1.03   ------------------------------------------------------------------------------------------------------------------- No results for input(s): DDIMER in the last 72 hours. -------------------------------------------------------------------------------------------------------------------  Cardiac Enzymes Recent Labs  Lab 08/18/18 1509  TROPONINI 0.12*    ------------------------------------------------------------------------------------------------------------------ Invalid input(s): POCBNP  ---------------------------------------------------------------------------------------------------------------  Urinalysis No results found for: COLORURINE, APPEARANCEUR, LABSPEC, PHURINE, GLUCOSEU, HGBUR, BILIRUBINUR, KETONESUR, PROTEINUR, UROBILINOGEN, NITRITE, LEUKOCYTESUR   RADIOLOGY: Ct Angio Chest Pe W And/or Wo Contrast  Result Date: 08/18/2018 CLINICAL DATA:  Syncope. Sudden onset of dyspnea. Prior history of PE. EXAM: CT ANGIOGRAPHY CHEST WITH CONTRAST TECHNIQUE: Multidetector CT imaging of the chest was performed using the standard protocol during bolus administration of intravenous contrast. Multiplanar CT image reconstructions and MIPs were obtained to evaluate the vascular anatomy. CONTRAST:  38mL OMNIPAQUE IOHEXOL 350 MG/ML SOLN COMPARISON:  CT chest 04/23/2016 FINDINGS: Cardiovascular: Heart size is normal. There is a large saddle embolus which crosses the midline. Additional pulmonary emboli noted involving the right upper, lower and middle lobe are pulmonary arteries as well as the left upper lobe and left lower lobe pulmonary arteries. Segmental pulmonary artery filling defects are also noted within the right lower lobe. There is evidence of right heart strain. The RV to LV ratio is equal to 1.8. Reflux of contrast material is noted into the IVC and hepatic vein. Mediastinum/Nodes: Normal appearance of the thyroid gland. The trachea appears patent and is midline. Normal appearance of the esophagus., axillary, mediastinal or hilar lymph nodes. Lungs/Pleura: No pleural effusion, airspace consolidation, atelectasis or pneumothorax identified. Fibrotic changes with volume loss identified within the anterior right lung likely representing changes secondary to external beam radiation. No suspicious pulmonary nodule or mass identified. Upper Abdomen:  Left lobe of liver cyst identified measuring 2.5 cm. 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 Musculoskeletal: There are postoperative changes from right mastectomy. There is a large area of ulceration with surrounding increased soft tissue and skin thickening involving the ventral chest wall, image 36/4. Review of the MIP images confirms the above findings. IMPRESSION: 1. Positive for acute PE with CT evidence of right heart strain (RV/LV Ratio = 1.8) consistent with at least submassive (intermediate risk) PE. The presence of right heart strain has been associated with an increased risk of morbidity and mortality. Please activate Code PE by paging [phone]. 2. Status post right mastectomy with right ventral chest wall ulceration and skin thickening. The appearance is nonspecific in may represent sequelae of post treatment changes. Recurrence of tumor would be difficult to exclude and follow-up with breast surgeon is advised. 3. Liver cyst. 4. Critical Value/emergent results were called by telephone at the time of interpretation on 08/18/2018 at 4:51 pm to Dr. Delman Kitten , who verbally acknowledged these results. Electronically Signed   By: Kerby Moors M.D.   On: 08/18/2018 16:51   Dg Chest Portable 1 View  Result Date: 08/18/2018 CLINICAL DATA:  Dyspnea and tachycardia with syncope today. EXAM: PORTABLE CHEST 1 VIEW COMPARISON:  04/23/2016 chest CT FINDINGS: The patient is status post right mastectomy. Heart size is top-normal. Nonaneurysmal thoracic aorta is noted. No acute pulmonary consolidation, edema, effusion or pneumothorax. No aggressive osseous lesions.  IMPRESSION: 1. No active disease. 2. Status post mastectomy. Electronically Signed   By: Ashley Royalty M.D.   On: 08/18/2018 16:18    EKG: Orders placed or performed during the hospital encounter of 08/18/18  . ED EKG  . ED EKG  . EKG 12-Lead  . EKG 12-Lead    IMPRESSION AND PLAN:  *Pulmonary embolism Right  heart strain Patient is tachycardic and significantly short of breath on minimal exertion. Decision was made to give IV TPA by intensivist and ER physician has already placed order and started on TPA.  As per intensivist, start heparin drip after TPA is finished. I will call oncology consult for further management.  Most likely she would need lifelong anticoagulation now. Get echocardiogram to check for right heart strain tomorrow.  *Elevated troponin This could be due to heart strain and pulmonary emboli.  Continue to monitor.  *Hypothyroidism Continue levothyroxine.  *Hyperlipidemia Continue simvastatin.  *Pain due to hole in the chest and cancer, continue home medications.   All the records are reviewed and case discussed with ED provider. Management plans discussed with the patient, family and they are in agreement.  CODE STATUS: DNR    TOTAL TIME TAKING CARE OF THIS PATIENT: 50 minutes.  Patient's husband and son were present in the room during my visit  Vaughan Basta M.D on 08/18/2018   Between 7am to 6pm - Pager - [phone]  After 6pm go to [email or web address] - password [email or web address] [facility]  Office  [phone]  CC: Primary care physician; Rusty Aus, MD   Note: This dictation was prepared with Dragon dictation along with smaller phrase technology. Any transcriptional errors that result from this process are unintentional.

## 2018-08-18 NOTE — ED Notes (Signed)
Patient's wound to right chest began bleeding after receiving TPA administration. Dr. Jacqualine Code aware and in to see patient. Per Dr. Jacqualine Code, patient to have pressure applied to area x20 mins to attempt to alleviate bleeding. Patient had approx 8 inch gauze strip with bandaid type bandage over chest wound with gauze strip saturated in blood. Half of bandaid type bandage was also saturated in blood. Blood was also noted on gown (approx 4 inch area soaked onto gown) with blood dripping down patient's side as well. Patient continues to have bleeding at the gums, for which MD is already aware of. Patient complains of no other symptoms at this time. Patient's wound redressed with gauze wrapping to pack wound, followed by 4x4 gauze squares stacked and taped.

## 2018-08-18 NOTE — Progress Notes (Signed)
Patient admitted to ICU bed 08 shortly after shift change. Normal Saline infusion hanging post TPA administration. Patient vital signs stable, right chest wall wound draining minimal sanguinous fluid. Patient alert and orientated, husband and son at bedside.   Patient's urinary catheter leaking and saturating brief. Balloon volume checked which was 10 ml, patient verbalized no discomfort from catheter. 10 ml of urine returned from catheter, with two brief's saturated of urine. Patient verbalizes she can feel the catheter leaking. Foley catheter removed, Darlyn Chamber NP aware. A PureWick external female urine capturing device was placed. Patient already has voided 75 ml of urine via external catheter.

## 2018-08-18 NOTE — Consult Note (Signed)
ANTICOAGULATION CONSULT NOTE - Follow Up Consult  Pharmacy Consult for heparin drip management Indication: pulmonary embolus  No Known Allergies  Patient Measurements: Height: 5\' 7"  (170.2 cm) Weight: 200 lb (90.7 kg) IBW/kg (Calculated) : 61.6 Heparin Dosing Weight: 81 kg  Vital Signs: Temp: 97.7 F (36.5 C) (01/21 1456) Temp Source: Oral (01/21 1456) BP: 119/75 (01/21 1500) Pulse Rate: 132 (01/21 1500)  Labs: Recent Labs    08/18/18 1509  HGB 14.8  HCT 45.3  PLT 174  CREATININE 1.04*    Estimated Creatinine Clearance: 59 mL/min (A) (by C-G formula based on SCr of 1.04 mg/dL (H)).  Assessment: 68 y.o. female here for evaluation syncope. CT scan shows  large pulmonary embolism bilaterally. She reports to me that she is on no anticoagulants PTA   Goal of Therapy:  Heparin level 0.3-0.7 units/ml Monitor platelets by anticoagulation protocol: Yes   Plan:  Give 5500 units bolus x 1 Start heparin infusion at 1500 units/hr Check anti-Xa level in 6 hours and daily while on heparin Continue to monitor H&H and platelets  Dallie Piles, PharmD 08/18/2018,4:47 PM

## 2018-08-18 NOTE — ED Provider Notes (Addendum)
[facility] Emergency Department Provider Note  ____________________________________________   First MD Initiated Contact with Patient 08/18/18 1538     (approximate)  I have reviewed the triage vital signs and the nursing notes.   HISTORY  Chief Complaint Near Syncope  EM caveat: None patient appears hemodynamically unstable, acuity and high concern for potential massive acute cardiac or pulmonary etiology  HPI [name] [name] is a 68 y.o. female here for evaluation after she reports she nearly passed out.  Was getting up, suddenly felt very short of breath as though she was going to pass out.  Had to lay down.  Called her husband and she thought she was about to die.   She reports she had a brief episode of chest pain at that time.  She did not fall or injure herself.  She reports he feels short of breath.  No recent fevers or chills.  No nausea or vomiting.  Has a chronic chest wound on the right breast from radiation but has chronic drainage that is followed regularly, no changes around that site.  No abdominal pain.  No back pain.  Reports a previous history of a blood clot in her lungs it felt kind similar 10 years ago Past Medical History:  Diagnosis Date  . Anxiety   . Cancer [facility])    2006 right side breast ca. 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 GERD (gastroesophageal reflux disease)   . Hypercholesteremia   . Peripheral neuropathic pain   . Personal history of chemotherapy    2006/2014  . Personal history of radiation therapy    2006/2014  . Pulmonary emboli ([place])   . Urinary incontinence     Patient Active Problem List   Diagnosis Date Noted  . Pulmonary emboli ([place]) 08/18/2018  . Pulmonary embolism ([place]) 08/18/2018  . 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 APPENDECTOMY    . CARDIAC CATHETERIZATION Left 05/23/2016   Procedure: Left Heart Cath and Coronary  Angiography;  Surgeon: Isaias Cowman, MD;  Location: [facility];  Service: Cardiovascular;  Laterality: Left;  Marland Kitchen MASTECTOMY Right 2006/2014  . rt breast removal     cancer    Prior to Admission medications   Medication Sig Start Date End Date Taking? Authorizing Provider  Cholecalciferol (D-5000) 5000 units TABS Take by mouth every morning.    Yes [provider]  Cyanocobalamin (B-12 DOTS) 500 MCG TBDP Take by mouth every morning.    Yes [provider]  gabapentin (NEURONTIN) 300 MG capsule Take 900 mg by mouth at bedtime.   Yes [provider]  levothyroxine (SYNTHROID, LEVOTHROID) 125 MCG tablet Take 125 mcg by mouth.  12/19/17 12/19/18 Yes [provider]  LORazepam (ATIVAN) 1 MG tablet Take 1 mg by mouth at bedtime.   Yes [provider]  metroNIDAZOLE (FLAGYL) 500 MG tablet Take 500 mg by mouth daily. Crush tablet into fine powder, sprinkle on wound beds with dressing change 08/04/18 09/18/18 Yes [provider]  omeprazole (PRILOSEC) 20 MG capsule Take 20 mg by mouth daily.   Yes [provider]  simvastatin (ZOCOR) 20 MG tablet Take 20 mg by mouth daily at 6 PM.   Yes [provider]  temazepam (RESTORIL) 15 MG capsule Take 15 mg by mouth at bedtime as needed for sleep. 08/11/18  Yes [provider]  traMADol (ULTRAM) 50 MG tablet Take  by mouth every 6 (six) hours as needed.   Yes [provider]  acetaminophen (TYLENOL) 325 MG tablet Take 325 mg by mouth every 4 (four) hours as needed.    [provider]  cetirizine (ZYRTEC) 10 MG tablet Take 10 mg by mouth daily.    [provider]  fluticasone (FLONASE) 50 MCG/ACT nasal spray Place 2 sprays into both nostrils daily.    [provider]  HYDROcodone-acetaminophen (NORCO/VICODIN) 5-325 MG tablet 1 tablet every 6 (six) hours as needed.  12/19/17   [provider]  temazepam (RESTORIL) 15 MG capsule Take  15 mg by mouth at bedtime as needed.  12/19/17 06/29/18  [provider]    Allergies Patient has no known allergies.  Family History  Problem Relation Age of Onset  . Asthma Mother   . COPD Mother   . CVA Father   . Heart attack Father   . Coronary artery disease Father   . Schizophrenia Brother   . Peripheral vascular disease Brother   . Breast cancer Neg Hx     Social History Social History   Tobacco Use  . Smoking status: Never Smoker  . Smokeless tobacco: Never Used  Substance Use Topics  . Alcohol use: Never    Frequency: Never  . Drug use: No    Review of Systems Constitutional: No fever/chills Eyes: No visual changes. ENT: No sore throat. Cardiovascular: Briefly much more noted chest pain as epsiode started Respiratory: Short of breath Gastrointestinal: No abdominal pain.   Genitourinary: Negative for dysuria. Musculoskeletal: Negative for back pain. Skin: Negative for rash. Neurological: Negative for headaches, areas of focal weakness or numbness.    ____________________________________________   PHYSICAL EXAM:  VITAL SIGNS: ED Triage Vitals  Enc Vitals Group     BP 08/18/18 1456 118/65     Pulse Rate 08/18/18 1456 (!) 136     Resp 08/18/18 1456 (!) 22     Temp 08/18/18 1456 97.7 F (36.5 C)     Temp Source 08/18/18 1456 Oral     SpO2 08/18/18 1456 96 %     Weight 08/18/18 1457 200 lb (90.7 kg)     Height 08/18/18 1457 5\' 7"  (1.702 m)     Head Circumference --      Peak Flow --      Pain Score 08/18/18 1457 0     Pain Loc --      Pain Edu? --      Excl. in [place]? --     Constitutional: Alert and oriented.  Patient appears slightly dyspneic.  He appears slightly pale.  Appears critically ill, but awake and talking. Eyes: Conjunctivae are normal. Head: Atraumatic. Nose: No congestion/rhinnorhea. Mouth/Throat: Mucous membranes are moist. Neck: No stridor.  Cardiovascular: Notably sustained tachycardic rate, regular rhythm. Grossly  normal heart sounds.  Good peripheral circulation. Respiratory: Normal respiratory effort.  No retractions. Lungs CTAB.  The right chest wall has a cavity packed with dressing.  No bleeding.  No obvious frank purulent discharge. Gastrointestinal: Soft and nontender. No distention. Musculoskeletal: No lower extremity tenderness nor edema. Neurologic:  Normal speech and language. No gross focal neurologic deficits are appreciated.  Skin:  Skin is warm, dry and intact. No rash noted. Psychiatric: Mood and affect are normal. Speech and behavior are normal.  ____________________________________________   LABS (all labs ordered are listed, but only abnormal results are displayed)  Labs Reviewed  BASIC METABOLIC PANEL - Abnormal; Notable for the following components:  Result Value   Glucose, Bld 175 (*)    Creatinine, Ser 1.04 (*)    Calcium 8.6 (*)    GFR calc non Af Amer 55 (*)    All other components within normal limits  CBC - Abnormal; Notable for the following components:   RBC 5.18 (*)    All other components within normal limits  TROPONIN I - Abnormal; Notable for the following components:   Troponin I 0.12 (*)    All other components within normal limits  PROTIME-INR  APTT  URINALYSIS, COMPLETE (UACMP) WITH MICROSCOPIC  HEPARIN LEVEL (UNFRACTIONATED)  CBG MONITORING, ED   ____________________________________________  EKG  Reviewed and interpreted at 1500 Heart rate 135 QRS 120 QTc 480 Sinus tachycardia Right heart strain pattern is observable. ____________________________________________  RADIOLOGY  Ct Angio Chest Pe W And/or Wo Contrast  Result Date: 08/18/2018 CLINICAL DATA:  Syncope. Sudden onset of dyspnea. Prior history of PE. EXAM: CT ANGIOGRAPHY CHEST WITH CONTRAST TECHNIQUE: Multidetector CT imaging of the chest was performed using the standard protocol during bolus administration of intravenous contrast. Multiplanar CT image reconstructions and MIPs  were obtained to evaluate the vascular anatomy. CONTRAST:  51mL OMNIPAQUE IOHEXOL 350 MG/ML SOLN COMPARISON:  CT chest 04/23/2016 FINDINGS: Cardiovascular: Heart size is normal. There is a large saddle embolus which crosses the midline. Additional pulmonary emboli noted involving the right upper, lower and middle lobe are pulmonary arteries as well as the left upper lobe and left lower lobe pulmonary arteries. Segmental pulmonary artery filling defects are also noted within the right lower lobe. There is evidence of right heart strain. The RV to LV ratio is equal to 1.8. Reflux of contrast material is noted into the IVC and hepatic vein. Mediastinum/Nodes: Normal appearance of the thyroid gland. The trachea appears patent and is midline. Normal appearance of the esophagus., axillary, mediastinal or hilar lymph nodes. Lungs/Pleura: No pleural effusion, airspace consolidation, atelectasis or pneumothorax identified. Fibrotic changes with volume loss identified within the anterior right lung likely representing changes secondary to external beam radiation. No suspicious pulmonary nodule or mass identified. Upper Abdomen: Left lobe of liver cyst identified measuring 2.5 cm. 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 Musculoskeletal: There are postoperative changes from right mastectomy. There is a large area of ulceration with surrounding increased soft tissue and skin thickening involving the ventral chest wall, image 36/4. Review of the MIP images confirms the above findings. IMPRESSION: 1. Positive for acute PE with CT evidence of right heart strain (RV/LV Ratio = 1.8) consistent with at least submassive (intermediate risk) PE. The presence of right heart strain has been associated with an increased risk of morbidity and mortality. Please activate Code PE by paging [phone]. 2. Status post right mastectomy with right ventral chest wall ulceration and skin thickening. The appearance is  nonspecific in may represent sequelae of post treatment changes. Recurrence of tumor would be difficult to exclude and follow-up with breast surgeon is advised. 3. Liver cyst. 4. Critical Value/emergent results were called by telephone at the time of interpretation on 08/18/2018 at 4:51 pm to Dr. Delman Kitten , who verbally acknowledged these results. Electronically Signed   By: Kerby Moors M.D.   On: 08/18/2018 16:51   Dg Chest Portable 1 View  Result Date: 08/18/2018 CLINICAL DATA:  Dyspnea and tachycardia with syncope today. EXAM: PORTABLE CHEST 1 VIEW COMPARISON:  04/23/2016 chest CT FINDINGS: The patient is status post right mastectomy. Heart size is top-normal. Nonaneurysmal thoracic aorta is  noted. No acute pulmonary consolidation, edema, effusion or pneumothorax. No aggressive osseous lesions. IMPRESSION: 1. No active disease. 2. Status post mastectomy. Electronically Signed   By: Ashley Royalty M.D.   On: 08/18/2018 16:18      CT scan findings personally discussed and reviewed with both the radiologist and Dr. Jamal Collin of ICU ____________________________________________   PROCEDURES  Procedure(s) performed: None  Procedures  Critical Care performed: Yes, see critical care note(s)  CRITICAL CARE Performed by: Delman Kitten   Total critical care time: 60 minutes  Critical care time was exclusive of separately billable procedures and treating other patients.  Critical care was necessary to treat or prevent imminent or life-threatening deterioration.  Critical care was time spent personally by me on the following activities: development of treatment plan with patient and/or surrogate as well as nursing, discussions with consultants, evaluation of patient's response to treatment, examination of patient, obtaining history from patient or surrogate, ordering and performing treatments and interventions, ordering and review of laboratory studies, ordering and review of radiographic studies,  pulse oximetry and re-evaluation of patient's condition.  ____________________________________________   INITIAL IMPRESSION / ASSESSMENT AND PLAN / ED COURSE  Pertinent labs & imaging results that were available during my care of the patient were reviewed by me and considered in my medical decision making (see chart for details).   Patient presents for sudden onset dyspnea with short episode of chest pain and near syncope.  Her symptoms and constellation are extremely concerning for possible acute pulmonary embolism.  EKG does not appear to demonstrate an acute cardiac etiology though cannot be excluded yet.  She denies any infectious symptoms.  No head injury.  No neurologic symptoms.  Clinical Course as of Aug 18 1825  Tue Aug 18, 2018  1636 Reviewed the patient's CT scan immediately after performed, appears like there is probably large pulmonary embolism bilateral.  Will wait radiologist final read, have code pulmonary embolism initiated by page   [MQ]  1652 Discussed and reviewed history, patient history, CT findings. Agrees with immediate heparin bolus. Evaluating now for possible thrombolytics. TPA protocol for PE recommended by Dr. Alva Garnet. Dr. Alva Garnet advises with size of this PE TPA is indicated. Hold heparin until TPA infusion completes but good to give initial heparin bolus.     [MQ]  6045 DIscussed TPA with Corene Cornea, pharmacist assisting in dosing.    [MQ]  4098 Pharmacy working to get TPA to bedside STAT. Dosing confirmed with pharm MD and Dr. Alva Garnet who directed the dosing regimen.    [MQ]    Clinical Course User Index [MQ] Delman Kitten, MD   ----------------------------------------- 6:28 PM on 08/18/2018 -----------------------------------------    ADDITIONAL CRITICAL CARE Performed by: Delman Kitten   Total critical care time: 15 minutes  Patient reevaluated at this time, TPA infusion going.  Patient asymptomatic.  Hemodynamic stable except for ongoing tachycardia  which rate is slowly improved.  Patient reports she is feeling okay at this time, just still slightly dyspneic.  I again spoke with Dr. Jamal Collin, updated on patient condition and also Dr. Patsey Berthold the ICU is aware.  Patient seems to be doing well at this time.  The patient signed consent prior to TPA infusion we specifically discussed significant risk of bleeding, death, life-threatening bleeding, allergic reaction, or other cardiovascular side effects and risks of this medication.  However, in discussion with intensivist, my own evaluation, discussion with radiologist it appears at this time the best therapy for this patient is TPA infusion in order  to sustain her life from this very large pulmonary embolism  Critical care time was exclusive of separately billable procedures and treating other patients.  Critical care was necessary to treat or prevent imminent or life-threatening deterioration.  Critical care was time spent personally by me on the following activities: development of treatment plan with patient and/or surrogate as well as nursing, discussions with consultants, evaluation of patient's response to treatment, examination of patient, obtaining history from patient or surrogate, ordering and performing treatments and interventions, ordering and review of laboratory studies, ordering and review of radiographic studies, pulse oximetry and re-evaluation of patient's condition.  ____________________________________________   FINAL CLINICAL IMPRESSION(S) / ED DIAGNOSES  Final diagnoses:  Acute saddle pulmonary embolism with acute cor pulmonale ([place])        Note:  This document was prepared using Dragon voice recognition software and may include unintentional dictation errors       Delman Kitten, MD 08/18/18 Guerry Bruin, MD 08/19/18 2109

## 2018-08-18 NOTE — ED Notes (Signed)
Foley cath was placed.  Will begin alteplace once I have verification from Pharmacy

## 2018-08-18 NOTE — ED Notes (Signed)
Family at bedside. 

## 2018-08-18 NOTE — ED Notes (Signed)
TPA verified with Radonna Ricker RN and pharmacist Barbaraann Rondo about instructions of administration. Giving 15mg  (15 ml) in first 15 minutes, then will give 50mg  (70ml) over 30 minutes, and will finish with 35mg  (27ml) over 60 minutes. Pt informed of how long medication will take and how neuro assessments will be performed every 15 minutes.

## 2018-08-18 NOTE — ED Notes (Signed)
Call to ICU

## 2018-08-18 NOTE — ED Notes (Signed)
15mg  bolus complete. Rate changed to infuse 50mg  over next 30 minutes at 172ml/hour.

## 2018-08-18 NOTE — ED Notes (Signed)
Date and time results received: 08/18/18 5:42 PM    Test: Troponin Critical Value: 0.12  Name of Provider Notified: [place]

## 2018-08-18 NOTE — ED Notes (Signed)
CCU will call me back about taking pt

## 2018-08-18 NOTE — Progress Notes (Signed)
eLink Physician-Brief Progress Note Patient Name: [name] [name] DOB: [birth year] MRN: [id number]   Date of Service  08/18/2018  HPI/Events of Note  68 y.o. female with a known history of anxiety, breast cancer status post mastectomy and radiation, gastroesophageal reflux disease, hypercholesterolemia, peripheral neuropathy, pulmonary embolism-she was on anticoagulation for many years and stopped 2 to 3 years ago. For last 1 to 2 days she has significant shortness of breath on minimal exertion which is new for hers and she also has some pain in her chest so came to emergency room She was noted to have a saddle pulmonary embolism with significant right heart strain per CT scan angiogram so ER physician discussed with intensivist Dr. Alva Garnet.  He suggested to start on IV TPA and admit to stepdown unit for monitoring. PCCM asked to assume care in stepdown unit. VSS.   eICU Interventions  No new orders.      Intervention Category Evaluation Type: New Patient Evaluation  Lysle Dingwall 08/18/2018, 8:13 PM

## 2018-08-18 NOTE — Consult Note (Signed)
Name: [name] [name] MRN: [id number] DOB: 1951-04-08    ADMISSION DATE:  08/18/2018 CONSULTATION DATE:  08/18/2018  REFERRING MD :  Dr. Anselm Jungling  CHIEF COMPLAINT:  Shortness of Breath  BRIEF PATIENT DESCRIPTION:  68 y.o. Female with a PMH notable for Breast Cancer who presents with Acute Hypoxic Respiratory Failure in the setting of Acute saddle Pulmonary Embolism with evidence of right heart strain.  She received tPA in ED.  SIGNIFICANT EVENTS  08/18/18>> Admission to [facility] Stepdown unit  STUDIES:  CTA Chest 08/18/18>> Positive for acute PE with CT evidence of right heart strain (RV/LV Ratio = 1.8) consistent with at least submassive (intermediate risk). Echocardiogram 08/19/18>>  CULTURES:  ANTIBIOTICS: Flagyl>> Pt takes as outpatient>applies to right chest wound with dressing changes  HISTORY OF PRESENT ILLNESS:   [name] [name] is a 68 y.o. Female with a PMH notable for breast cancer and Pulmonary embolism (of which she was on anticoagulation until approximately 2 years ago),  who presents to [facility] ED on 08/18/18 with c/o shortness of breath and chest pain. She reports that her shortness of breath began abruptly.  She denies fever, chills, cough, sputum production, edema, or sick contacts.  Initial workup in the ED revealed Creatinine 1.04, Troponin 0.12, and CXR was negative for any acute process.  CTA Chest was obtained which revealed acute Pulmonary Embolism with evidence of right heart strain.  Given her work of breathing and significant heart strain, she received tPA in the ED.  She is admitted to [facility] for treatment of Acute Hypoxic Respiratory Failure in the setting of Acute Saddle Pulmonary Embolism requiring tPA and for close monitoring.  PCCM is consulted for further management.  PAST MEDICAL HISTORY :   has a past medical history of Anxiety, Cancer ([place]), GERD (gastroesophageal reflux disease), Hypercholesteremia, Peripheral neuropathic pain, Personal history of  chemotherapy, Personal history of radiation therapy, Pulmonary emboli ([place]), and Urinary incontinence.  has a past surgical history that includes Appendectomy; rt breast removal; Cardiac catheterization (Left, 05/23/2016); and Mastectomy (Right, 2006/2014). Prior to Admission medications   Medication Sig Start Date End Date Taking? Authorizing Provider  Cholecalciferol (D-5000) 5000 units TABS Take by mouth every morning.    Yes [provider]  Cyanocobalamin (B-12 DOTS) 500 MCG TBDP Take by mouth every morning.    Yes [provider]  gabapentin (NEURONTIN) 300 MG capsule Take 900 mg by mouth at bedtime.   Yes [provider]  levothyroxine (SYNTHROID, LEVOTHROID) 125 MCG tablet Take 125 mcg by mouth.  12/19/17 12/19/18 Yes [provider]  LORazepam (ATIVAN) 1 MG tablet Take 1 mg by mouth at bedtime.   Yes [provider]  metroNIDAZOLE (FLAGYL) 500 MG tablet Take 500 mg by mouth daily. Crush tablet into fine powder, sprinkle on wound beds with dressing change 08/04/18 09/18/18 Yes [provider]  omeprazole (PRILOSEC) 20 MG capsule Take 20 mg by mouth daily.   Yes [provider]  simvastatin (ZOCOR) 20 MG tablet Take 20 mg by mouth daily at 6 PM.   Yes [provider]  temazepam (RESTORIL) 15 MG capsule Take 15 mg by mouth at bedtime as needed for sleep. 08/11/18  Yes [provider]  traMADol (ULTRAM) 50 MG tablet Take by mouth every 6 (six) hours as needed.   Yes [provider]  acetaminophen (TYLENOL) 325 MG tablet Take 325 mg by mouth every 4 (four) hours as needed.    [provider]  cetirizine (ZYRTEC) 10 MG  tablet Take 10 mg by mouth daily.    [provider]  fluticasone (FLONASE) 50 MCG/ACT nasal spray Place 2 sprays into both nostrils daily.    [provider]  HYDROcodone-acetaminophen (NORCO/VICODIN) 5-325 MG tablet 1 tablet every 6 (six) hours as needed.  12/19/17    [provider]  temazepam (RESTORIL) 15 MG capsule Take 15 mg by mouth at bedtime as needed.  12/19/17 06/29/18  [provider]   No Known Allergies  FAMILY HISTORY:  family history includes Asthma in her mother; COPD in her mother; CVA in her father; Coronary artery disease in her father; Heart attack in her father; Peripheral vascular disease in her brother; Schizophrenia in her brother. SOCIAL HISTORY:  reports that she has never smoked. She has never used smokeless tobacco. She reports that she does not drink alcohol or use drugs.  REVIEW OF SYSTEMS:  Positives in BOLD: Pt denies all complaints Constitutional: Negative for fever, chills, weight loss, malaise/fatigue and diaphoresis.  HENT: Negative for hearing loss, ear pain, nosebleeds, congestion, sore throat, neck pain, tinnitus and ear discharge.   Eyes: Negative for blurred vision, double vision, photophobia, pain, discharge and redness.  Respiratory: Negative for cough, hemoptysis, sputum production, shortness of breath, wheezing and stridor.   Cardiovascular: Negative for chest pain, palpitations, orthopnea, claudication, leg swelling and PND.  Gastrointestinal: Negative for heartburn, nausea, vomiting, abdominal pain, diarrhea, constipation, blood in stool and melena.  Genitourinary: Negative for dysuria, urgency, frequency, hematuria and flank pain.  Musculoskeletal: Negative for myalgias, back pain, joint pain and falls.  Skin: Negative for itching and rash.  Neurological: Negative for dizziness, tingling, tremors, sensory change, speech change, focal weakness, seizures, loss of consciousness, weakness and headaches.  Endo/Heme/Allergies: Negative for environmental allergies and polydipsia. Does not bruise/bleed easily.  SUBJECTIVE:  Pt denies chest pain or shortness of breath No fever, chills, N/V, no edema On 2L [place]  VITAL SIGNS: Temp:  [97.7 F (36.5 C)-99.2 F (37.3 C)] 99.2 F (37.3 C) (01/21  2000) Pulse Rate:  [118-142] 128 (01/21 1915) Resp:  [12-23] 22 (01/21 1915) BP: (82-143)/(54-98) 143/94 (01/21 2000) SpO2:  [93 %-98 %] 97 % (01/21 2000) Weight:  [90.7 kg-103.9 kg] 96.6 kg (01/21 2000)  PHYSICAL EXAMINATION: General:  Acutely ill appearing obese female, laying in bed, on 2L [place], in NAD Neuro:  Awake, A&O x4, follows commands, no focal deficits, speech clear HEENT:  Atraumatic, normocephalic, neck supple, no JVD Cardiovascular:  Tachycardia, regular rhythm, s1s2, no M/R/G, 2+ pulses throughout Lungs:  Clear diminished to auscultation throughout, even, nonlabored, normal effot Abdomen:  Obese, soft, nontender, nondistended, no guarding or rebound tenderness, BS+ x4 Musculoskeletal:  No deformities, normal bulk and tone, no edema Skin:  Warm/dry.  No obvious rashses, lesions, or ulcerations.  Small chronic wound to right upper chest from previous chemo/radiation  Recent Labs  Lab 08/18/18 1509  NA 140  K 3.7  CL 106  CO2 24  BUN 13  CREATININE 1.04*  GLUCOSE 175*   Recent Labs  Lab 08/18/18 1509  HGB 14.8  HCT 45.3  WBC 7.8  PLT 174   Ct Angio Chest Pe W And/or Wo Contrast  Result Date: 08/18/2018 CLINICAL DATA:  Syncope. Sudden onset of dyspnea. Prior history of PE. EXAM: CT ANGIOGRAPHY CHEST WITH CONTRAST TECHNIQUE: Multidetector CT imaging of the chest was performed using the standard protocol during bolus administration of intravenous contrast. Multiplanar CT image reconstructions and MIPs were obtained to evaluate the vascular anatomy. CONTRAST:  75mL OMNIPAQUE IOHEXOL 350 MG/ML SOLN COMPARISON:  CT chest 04/23/2016 FINDINGS: Cardiovascular: Heart size is normal. There is a large saddle embolus which crosses the midline. Additional pulmonary emboli noted involving the right upper, lower and middle lobe are pulmonary arteries as well as the left upper lobe and left lower lobe pulmonary arteries. Segmental pulmonary artery filling defects are also noted within  the right lower lobe. There is evidence of right heart strain. The RV to LV ratio is equal to 1.8. Reflux of contrast material is noted into the IVC and hepatic vein. Mediastinum/Nodes: Normal appearance of the thyroid gland. The trachea appears patent and is midline. Normal appearance of the esophagus., axillary, mediastinal or hilar lymph nodes. Lungs/Pleura: No pleural effusion, airspace consolidation, atelectasis or pneumothorax identified. Fibrotic changes with volume loss identified within the anterior right lung likely representing changes secondary to external beam radiation. No suspicious pulmonary nodule or mass identified. Upper Abdomen: Left lobe of liver cyst identified measuring 2.5 cm. 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 Musculoskeletal: There are postoperative changes from right mastectomy. There is a large area of ulceration with surrounding increased soft tissue and skin thickening involving the ventral chest wall, image 36/4. Review of the MIP images confirms the above findings. IMPRESSION: 1. Positive for acute PE with CT evidence of right heart strain (RV/LV Ratio = 1.8) consistent with at least submassive (intermediate risk) PE. The presence of right heart strain has been associated with an increased risk of morbidity and mortality. Please activate Code PE by paging [phone]. 2. Status post right mastectomy with right ventral chest wall ulceration and skin thickening. The appearance is nonspecific in may represent sequelae of post treatment changes. Recurrence of tumor would be difficult to exclude and follow-up with breast surgeon is advised. 3. Liver cyst. 4. Critical Value/emergent results were called by telephone at the time of interpretation on 08/18/2018 at 4:51 pm to Dr. Delman Kitten , who verbally acknowledged these results. Electronically Signed   By: Kerby Moors M.D.   On: 08/18/2018 16:51   Dg Chest Portable 1 View  Result Date:  08/18/2018 CLINICAL DATA:  Dyspnea and tachycardia with syncope today. EXAM: PORTABLE CHEST 1 VIEW COMPARISON:  04/23/2016 chest CT FINDINGS: The patient is status post right mastectomy. Heart size is top-normal. Nonaneurysmal thoracic aorta is noted. No acute pulmonary consolidation, edema, effusion or pneumothorax. No aggressive osseous lesions. IMPRESSION: 1. No active disease. 2. Status post mastectomy. Electronically Signed   By: Ashley Royalty M.D.   On: 08/18/2018 16:18    ASSESSMENT / PLAN:  Acute Hypoxic Respiratory Failure in setting of Acute Saddle Pulmonary Embolism with Evidence of Right Heart Strain Hx: PE -Supplemental O2 prn to maintain O2 sats> 92% -Follow intermittent CXR and ABG as needed -S/p tPA in the ED -To start on Heparin drip (NO BOLUS) once aPTT <80 seconds -Follow aPTT q4h -Frequent Neuro checks to assess for intracranial bleeding -Monitor for s/sx of bleeding -Trend CBC -Transfuse for Hgb<7 -Oncology consulted, appreciate input -Pt will likely need lifelong Anticoagulation  Mildly Elevated Troponin, likely demand ischemia in setting of PE with right heart strain -Cardiac monitoring -Trend Troponin -Obtain Echocardiogram -Heparin drip once aPTT <80 -Prn Hydralazine to maintain SBP <180 or DBP <105 given she received tPA  Hx: Hypothyroidism -Continue Synthroid  DISPOSITION: Stepdown GOALS OF CARE: DNR for 24 hrs following tPA.  Then pt would like to resume Full Code VTE PROPHYLAXIS: Heparin drip UPDATES:Updated pt and her husband at bedside  08/18/18.   Darel Hong, AGACNP-BC [facility] Pager: [phone] Cell: [phone]  08/18/2018, 8:30 PM

## 2018-08-18 NOTE — ED Notes (Signed)
50mg  infusion of TPA over 30 minutes completed. Rate changed to 32ml/hour to infuse 35mg  over 60 minutes. Verified with Anda Kraft, RN at bedside.

## 2018-08-18 NOTE — ED Notes (Signed)
CCU to call me back to take report

## 2018-08-18 NOTE — Progress Notes (Signed)
Family Meeting Note  Advance Directive:no  Today a meeting took place with the Patient, spouse and son.   The following clinical team members were present during this meeting:MD  The following were discussed:Patient's diagnosis: Pulmonary emboli with right heart strain, status post breast cancer and radiation, Patient's progosis: Unable to determine and Goals for treatment: DNR  Initially patient and her family told to keep her full code, but later on TPA she started having some minor gum bleedings, after that after having discussion with ER physician-and being informed about excessive damage and bleed in the chest and lungs if she have CPR after having thrombolytics now-patient had changed her mind and decided to stay as DNR for next 1 to 2 days in case if she has cardiac arrest.  She would like to be converted to full code after 2 days..  Additional follow-up to be provided: Intencivist  Time spent during discussion:20 minutes  [name] Basta, MD

## 2018-08-18 NOTE — ED Notes (Signed)
Pt has some lower gum bleeding. Will request EDP to bedside.

## 2018-08-18 NOTE — ED Notes (Signed)
Report to Anderson Malta, RN at bedside.

## 2018-08-18 NOTE — ED Notes (Signed)
CCU called to notify me that night shift will call for report

## 2018-08-18 NOTE — ED Notes (Signed)
Admitting MD at bedside.

## 2018-08-18 NOTE — ED Provider Notes (Signed)
Reassessed patient.  Discussed with both her and her husband, after discussion shared medical decision making given her current situation and TPA infusion for about the next day the patient reports she would like to be DO NOT RESUSCITATE and DO NOT INTUBATE.  I think this is reasonable, we discussed that if she were to have a cardiac arrest secondary to large pulmonary embolism receiving CPR would likely cause bleeding and further complications.  Patient in agreement, she and her husband are comfortable with plan to be DO NOT RESUSCITATE at least for the length of TPA in next 24 hours.  I discussed with Dr. Anselm Jungling including that DNR should be reassessed in a day.   Delman Kitten, MD 08/18/18 [phone]

## 2018-08-18 NOTE — ED Notes (Signed)
IV placement for CTA in left [facility], unable to advance the the cath.  2nd attempts made in left forearm and after successful placement the IV was accidentally pulled out.  Charge RN notified and she will go to bedside for IV placement.

## 2018-08-18 NOTE — ED Notes (Signed)
ED Provider at bedside to assess gum bleeding and hypotension.

## 2018-08-18 NOTE — ED Triage Notes (Signed)
Pt was brought in by EMS from home where she lives with her husband. Pt was getting up after voiding and she felt suddenly faint. EMS found her to be tachy in 140's, no CP but pt felt like it was difficult to catch her breath.  Pt CBG was 156 for ems. Pt arrives with HR in 130's to 140's.  pT was has hx of breast CA, no current treatment but has a wound in right chest where mastectomy was and has dressing in place, no recent fever or chills.

## 2018-08-19 ENCOUNTER — Other Ambulatory Visit (INDEPENDENT_AMBULATORY_CARE_PROVIDER_SITE_OTHER): Payer: Self-pay | Admitting: Vascular Surgery

## 2018-08-19 ENCOUNTER — Encounter
Admission: EM | Disposition: A | Discharge status: Home / Self Care | Payer: Self-pay | Source: Home / Self Care | Attending: Internal Medicine

## 2018-08-19 ENCOUNTER — Inpatient Hospital Stay (HOSPITAL_COMMUNITY)
Admit: 2018-08-19 | Discharge: 2018-08-19 | Disposition: A | Discharge status: Home / Self Care | Payer: Medicare Other | Attending: Internal Medicine | Admitting: Internal Medicine

## 2018-08-19 ENCOUNTER — Inpatient Hospital Stay: Payer: Medicare Other

## 2018-08-19 DIAGNOSIS — I82402 Acute embolism and thrombosis of unspecified deep veins of left lower extremity: Secondary | ICD-10-CM

## 2018-08-19 DIAGNOSIS — I2699 Other pulmonary embolism without acute cor pulmonale: Secondary | ICD-10-CM

## 2018-08-19 DIAGNOSIS — E785 Hyperlipidemia, unspecified: Secondary | ICD-10-CM

## 2018-08-19 DIAGNOSIS — R55 Syncope and collapse: Secondary | ICD-10-CM

## 2018-08-19 DIAGNOSIS — I5031 Acute diastolic (congestive) heart failure: Secondary | ICD-10-CM

## 2018-08-19 DIAGNOSIS — C50919 Malignant neoplasm of unspecified site of unspecified female breast: Secondary | ICD-10-CM

## 2018-08-19 HISTORY — PX: IVC FILTER INSERTION: CATH118245

## 2018-08-19 LAB — BASIC METABOLIC PANEL
Anion gap: 8 (ref 5–15)
BUN: 12 mg/dL (ref 8–23)
CO2: 27 mmol/L (ref 22–32)
Calcium: 8.4 mg/dL — ABNORMAL LOW (ref 8.9–10.3)
Chloride: 111 mmol/L (ref 98–111)
Creatinine, Ser: 0.95 mg/dL (ref 0.44–1.00)
GFR calc Af Amer: >60 mL/min (ref >60)
GFR calc non Af Amer: >60 mL/min (ref >60)
GLUCOSE: 120 mg/dL — AB (ref 70–99)
Potassium: 3.7 mmol/L (ref 3.5–5.1)
Sodium: 146 mmol/L — ABNORMAL HIGH (ref 135–145)

## 2018-08-19 LAB — HEPARIN LEVEL (UNFRACTIONATED)
Heparin Unfractionated: 0.24 IU/mL — ABNORMAL LOW (ref 0.30–0.70)
Heparin Unfractionated: 0.15 IU/mL — ABNORMAL LOW (ref 0.30–0.70)
Heparin Unfractionated: 0.58 IU/mL (ref 0.30–0.70)

## 2018-08-19 LAB — CBC
HEMATOCRIT: 44.3 % (ref 36.0–46.0)
Hemoglobin: 14.1 g/dL (ref 12.0–15.0)
MCH: 28 pg (ref 26.0–34.0)
MCHC: 31.8 g/dL (ref 30.0–36.0)
MCV: 87.9 fL (ref 80.0–100.0)
Platelets: 183 10*3/uL (ref 150–400)
RBC: 5.04 MIL/uL (ref 3.87–5.11)
RDW: 13.5 % (ref 11.5–15.5)
WBC: 7 10*3/uL (ref 4.0–10.5)
nRBC: 0 % (ref 0.0–0.2)

## 2018-08-19 LAB — TROPONIN I: Troponin I: 0.83 ng/mL (ref <0.03)

## 2018-08-19 LAB — ECHOCARDIOGRAM COMPLETE
Height: 67 in
Weight: 3407.43 oz

## 2018-08-19 LAB — APTT
APTT: 46 s — AB (ref 24–36)
aPTT: 61 seconds — ABNORMAL HIGH (ref 24–36)
aPTT: 66 seconds — ABNORMAL HIGH (ref 24–36)
aPTT: 83 seconds — ABNORMAL HIGH (ref 24–36)

## 2018-08-19 SURGERY — IVC FILTER INSERTION
Anesthesia: Moderate Sedation

## 2018-08-19 MED ORDER — HYDROCODONE-ACETAMINOPHEN 5-325 MG PO TABS: 1.0000 | ORAL_TABLET | ORAL | Status: DC | PRN

## 2018-08-19 MED ORDER — ONDANSETRON HCL 4 MG/2ML IJ SOLN: 4.0000 mg | Freq: Four times a day (QID) | INTRAMUSCULAR | Status: DC | PRN

## 2018-08-19 MED ORDER — HEPARIN (PORCINE) 25000 UT/250ML-% IV SOLN
1350.0000 [IU]/h | INTRAVENOUS | Status: DC
  Administered 2018-08-19: 1450 [IU]/h via INTRAVENOUS
  Administered 2018-08-19: 1200 [IU]/h via INTRAVENOUS
  Administered 2018-08-20: 1450 [IU]/h via INTRAVENOUS
  Filled 2018-08-19 (×3): qty 250

## 2018-08-19 MED ORDER — IOPAMIDOL (ISOVUE-300) INJECTION 61%
INTRAVENOUS | Status: DC | PRN
  Administered 2018-08-19: 10 mL via INTRAVENOUS

## 2018-08-19 MED ORDER — MIDAZOLAM HCL 2 MG/2ML IJ SOLN
INTRAMUSCULAR | Status: AC
  Filled 2018-08-19: qty 2

## 2018-08-19 MED ORDER — FAMOTIDINE 20 MG PO TABS: 40.0000 mg | ORAL_TABLET | ORAL | Status: DC | PRN

## 2018-08-19 MED ORDER — LIDOCAINE-EPINEPHRINE (PF) 1 %-1:200000 IJ SOLN
INTRAMUSCULAR | Status: AC
  Filled 2018-08-19: qty 30

## 2018-08-19 MED ORDER — MIDAZOLAM HCL 2 MG/ML PO SYRP: 8.0000 mg | ORAL_SOLUTION | Freq: Once | ORAL | Status: DC | PRN

## 2018-08-19 MED ORDER — HYDROMORPHONE HCL 1 MG/ML IJ SOLN: 1.0000 mg | Freq: Once | INTRAMUSCULAR | Status: DC | PRN

## 2018-08-19 MED ORDER — METHYLPREDNISOLONE SODIUM SUCC 125 MG IJ SOLR: 125.0000 mg | INTRAMUSCULAR | Status: DC | PRN

## 2018-08-19 MED ORDER — PERFLUTREN LIPID MICROSPHERE
1.0000 mL | INTRAVENOUS | Status: AC | PRN
  Administered 2018-08-19: 2 mL via INTRAVENOUS
  Filled 2018-08-19: qty 10

## 2018-08-19 MED ORDER — MIDAZOLAM HCL 2 MG/2ML IJ SOLN
INTRAMUSCULAR | Status: DC | PRN
  Administered 2018-08-19: 2 mg via INTRAVENOUS

## 2018-08-19 MED ORDER — DIPHENHYDRAMINE HCL 50 MG/ML IJ SOLN: 50.0000 mg | Freq: Once | INTRAMUSCULAR | Status: DC

## 2018-08-19 MED ORDER — CEFAZOLIN SODIUM-DEXTROSE 1-4 GM/50ML-% IV SOLN
1.0000 g | INTRAVENOUS | Status: AC
  Filled 2018-08-19: qty 50

## 2018-08-19 SURGICAL SUPPLY — 3 items
FILTER VC CELECT-FEMORAL (Filter) ×3 IMPLANT
PACK ANGIOGRAPHY (CUSTOM PROCEDURE TRAY) ×3 IMPLANT
WIRE J 3MM .035X145CM (WIRE) ×3 IMPLANT

## 2018-08-19 NOTE — Progress Notes (Signed)
Patient remains clinically stable post IVC filter placement. Heparin gtt restarted per Dr Lucky Cowboy orders. Denies complaints. Sinus rhythm per monitor. Husband at bedside. Right groin without bleeding nor hematoma. Report called to Izora Gala RN with questions answered/plan reviewed.

## 2018-08-19 NOTE — H&P (Signed)
[facility] History & Physical Update  The patient was interviewed and re-examined.  The patient's previous History and Physical has been reviewed and is unchanged.  There is no change in the plan of care. We plan to proceed with the scheduled procedure.  Leotis Pain, MD  08/19/2018, 1:36 PM

## 2018-08-19 NOTE — Op Note (Signed)
 VEIN AND VASCULAR SURGERY   OPERATIVE NOTE    PRE-OPERATIVE DIAGNOSIS: submassive PE, residual LLE DVT  POST-OPERATIVE DIAGNOSIS: same as above  PROCEDURE: 1.   Ultrasound guidance for vascular access to the right femoral vein 2.   Catheter placement into the inferior vena cava 3.   Inferior venacavogram 4.   Placement of a Cook Celect IVC filter  SURGEON: Leotis Pain, MD  ASSISTANT(S): None  ANESTHESIA: local with Moderate Sedation for approximately 15 minutes using 2 mg of Versed   ESTIMATED BLOOD LOSS: minimal  CONTRAST: 10 cc  FLUORO TIME: less than one minute  FINDING(S): 1.  Patent IVC  SPECIMEN(S):  none  INDICATIONS:   [name] [name] is a 68 y.o. female who presents with submassive PE and residual LLE DVT which could be fatal if further embolization occurs.  Inferior vena cava filter is indicated for this reason.  Risks and benefits including filter thrombosis, migration, fracture, bleeding, and infection were all discussed.  We discussed that all IVC filters that we place can be removed if desired from the patient once the need for the filter has passed.    DESCRIPTION: After obtaining full informed written consent, the patient was brought back to the vascular suite. The skin was sterilely prepped and draped in a sterile surgical field was created. Moderate conscious sedation was administered during a face to face encounter with the patient throughout the procedure with my supervision of the RN administering medicines and monitoring the patient's vital signs, pulse oximetry, telemetry and mental status throughout from the start of the procedure until the patient was taken to the recovery room. The right femoral vein was accessed under direct ultrasound guidance without difficulty with a Seldinger needle and a J-wire was then placed. After skin nick and dilatation, the delivery sheath was placed into the inferior vena cava and an inferior venacavogram was  performed. This demonstrated a patent IVC with the level of the renal veins at the top of L1.  The filter was then deployed into the inferior vena cava at the level of the L1-L2 interspace just below the renal veins. The delivery sheath was then removed. Pressure was held. Sterile dressings were placed. The patient tolerated the procedure well and was taken to the recovery room in stable condition.  COMPLICATIONS: None  CONDITION: Stable  Leotis Pain  08/19/2018, 2:00 PM   This note was created with Dragon Medical transcription system. Any errors in dictation are purely unintentional.

## 2018-08-19 NOTE — Progress Notes (Signed)
*  PRELIMINARY RESULTS* Echocardiogram 2D Echocardiogram has been performed.  [name] [name] 08/19/2018, 9:22 AM

## 2018-08-19 NOTE — Progress Notes (Addendum)
ANTICOAGULATION CONSULT NOTE - Initial Consult  Pharmacy Consult for heparin drip Indication: pulmonary embolus  No Known Allergies  Patient Measurements: Height: 5\' 7"  (170.2 cm) Weight: 212 lb 15.4 oz (96.6 kg) IBW/kg (Calculated) : 61.6 Heparin Dosing Weight: 83 kg  Vital Signs: Temp: 99.2 F (37.3 C) (01/21 2200) Temp Source: Oral (01/21 2200) BP: 120/70 (01/22 0100) Pulse Rate: 94 (01/22 0100)  Labs: Recent Labs    08/18/18 1509 08/18/18 2036 08/19/18 0004  HGB 14.8  --   --   HCT 45.3  --   --   PLT 174  --   --   APTT 33 109* 61*  LABPROT 13.4  --   --   INR 1.03  --   --   CREATININE 1.04*  --   --   TROPONINI 0.12* 0.90*  --     Estimated Creatinine Clearance: 60.9 mL/min (A) (by C-G formula based on SCr of 1.04 mg/dL (H)).   Medical History: Past Medical History:  Diagnosis Date  . Anxiety   . Cancer [facility])    2006 right side breast ca. 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 GERD (gastroesophageal reflux disease)   . Hypercholesteremia   . Peripheral neuropathic pain   . Personal history of chemotherapy    2006/2014  . Personal history of radiation therapy    2006/2014  . Pulmonary emboli ([place])   . Urinary incontinence     Medications:  No anticoag in PTA meds. S/p alteplase for PE.Marland Kitchen  Assessment:  Goal of Therapy:  Heparin level goal 0.3-0.5 for first 24 hours, then 0.3-0.7. Monitor platelets by anticoagulation protocol: Yes   Plan:  No bolus per consult. Initial rate of heparin 1200 units/hr. First heparin level 6 hours after start of infusion.  Aziel Morgan S 08/19/2018,1:28 AM

## 2018-08-19 NOTE — Progress Notes (Signed)
Patient escorted to Specials by writing RN for IVC filter placement, Hep gtt infusing at 1300 units/hr, VSS on room air, husband signed consent for procedure.  Report given to Jocelyn Lamer, [place] RN.  Jocelyn Lamer with patient when I left Specials

## 2018-08-19 NOTE — Progress Notes (Addendum)
ANTICOAGULATION CONSULT NOTE  Pharmacy Consult for heparin drip Indication: pulmonary embolus  No Known Allergies  Patient Measurements: Height: 5\' 6"  (167.6 cm) Weight: 211 lb 13.8 oz (96.1 kg) IBW/kg (Calculated) : 59.3 Heparin Dosing Weight: 83 kg  Vital Signs: Temp: 97.7 F (36.5 C) (01/22 1943) Temp Source: Oral (01/22 1943) BP: 153/76 (01/22 1943) Pulse Rate: 96 (01/22 1943)  Labs: Recent Labs    08/18/18 1509 08/18/18 2036  08/19/18 0348 08/19/18 0827 08/19/18 1534 08/19/18 2244  HGB 14.8  --   --  14.1  --   --   --   HCT 45.3  --   --  44.3  --   --   --   PLT 174  --   --  183  --   --   --   APTT 33 109*   < > 83* 66* 46*  --   LABPROT 13.4  --   --   --   --   --   --   INR 1.03  --   --   --   --   --   --   HEPARINUNFRC  --   --   --   --  0.24* 0.15* 0.58  CREATININE 1.04*  --   --  0.95  --   --   --   TROPONINI 0.12* 0.90*  --  0.83*  --   --   --    < > = values in this interval not displayed.    Estimated Creatinine Clearance: 65.3 mL/min (by C-G formula based on SCr of 0.95 mg/dL).   Medical History: Past Medical History:  Diagnosis Date  . Anxiety   . Cancer [facility])    2006 right side breast ca. 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 GERD (gastroesophageal reflux disease)   . Hypercholesteremia   . Peripheral neuropathic pain   . Personal history of chemotherapy    2006/2014  . Personal history of radiation therapy    2006/2014  . Pulmonary emboli ([place])   . Urinary incontinence     Medications:  Pharmacy consulted for heparin drip management for 68 yo female admitted with PE/DVT and received alteplase at 1805 on 1/22. Patient received IVC filter on 1/22. Patient receiving heparin at 1300 units/hr.   Assessment:  Goal of Therapy:  Heparin level goal 0.3-0.5 for first 24 hours, then 0.3-0.7. Monitor platelets by anticoagulation protocol: Yes   Plan:  Will increase heparin to 1450 units/hr and recheck heparin level at 2230.    1/22 2300 heparin level 0.58. Continue current regimen. Recheck heparin level and CBC with AM labs.  1/23 AM heparin level 0.61. Continue current regimen. Recheck heparin level and CBC with tomorrow AM labs.  Pharmacy will continue to monitor and adjust per consult.   Andrian Urbach S 08/19/2018,11:54 PM

## 2018-08-19 NOTE — Progress Notes (Signed)
[facility] NAME: [name] [name]    MR#:  [id number]  DATE OF BIRTH:  [birth year]  SUBJECTIVE:  CHIEF COMPLAINT:   Chief Complaint  Patient presents with  . Near Syncope   No new complaint this morning.  No chest pain.  No shortness of breath.  Family at bedside.  Updated on treatment plans as outlined above.  REVIEW OF SYSTEMS:  Review of Systems  Constitutional: Negative for chills, fever and weight loss.  HENT: Negative for hearing loss and tinnitus.   Eyes: Negative for blurred vision.  Respiratory: Negative for cough, hemoptysis and shortness of breath.   Cardiovascular: Negative for chest pain and palpitations.  Gastrointestinal: Negative for heartburn and nausea.  Genitourinary: Negative for dysuria and urgency.  Musculoskeletal: Negative for myalgias and neck pain.  Neurological: Negative for dizziness, tingling and headaches.  Psychiatric/Behavioral: Negative for depression and substance abuse.    DRUG ALLERGIES:  No Known Allergies VITALS:  Blood pressure (!) 157/70, pulse 90, temperature 98.5 F (36.9 C), temperature source Oral, resp. rate 16, height 5\' 7"  (1.702 m), weight 96.6 kg, SpO2 98 %. PHYSICAL EXAMINATION:   Physical Exam  Constitutional: She is oriented to person, place, and time and well-developed, well-nourished, and in no distress.  HENT:  Head: Normocephalic and atraumatic.  Eyes: Pupils are equal, round, and reactive to light. Conjunctivae and EOM are normal.  Neck: Normal range of motion. Neck supple. No tracheal deviation present.  Cardiovascular: Normal rate, regular rhythm and normal heart sounds.  Pulmonary/Chest: Effort normal and breath sounds normal.  Abdominal: Soft. Bowel sounds are normal. She exhibits no distension.  Musculoskeletal: Normal range of motion.        General: No edema.  Neurological: She is alert and oriented to person, place, and time. No cranial nerve deficit.   Skin: Skin is warm. She is not diaphoretic. No erythema.  Psychiatric: Affect and judgment normal.   LABORATORY PANEL:  Female CBC Recent Labs  Lab 08/19/18 0348  WBC 7.0  HGB 14.1  HCT 44.3  PLT 183   ------------------------------------------------------------------------------------------------------------------ Chemistries  Recent Labs  Lab 08/19/18 0348  NA 146*  K 3.7  CL 111  CO2 27  GLUCOSE 120*  BUN 12  CREATININE 0.95  CALCIUM 8.4*   RADIOLOGY:  Ct Angio Chest Pe W And/or Wo Contrast  Result Date: 08/18/2018 CLINICAL DATA:  Syncope. Sudden onset of dyspnea. Prior history of PE. EXAM: CT ANGIOGRAPHY CHEST WITH CONTRAST TECHNIQUE: Multidetector CT imaging of the chest was performed using the standard protocol during bolus administration of intravenous contrast. Multiplanar CT image reconstructions and MIPs were obtained to evaluate the vascular anatomy. CONTRAST:  35mL OMNIPAQUE IOHEXOL 350 MG/ML SOLN COMPARISON:  CT chest 04/23/2016 FINDINGS: Cardiovascular: Heart size is normal. There is a large saddle embolus which crosses the midline. Additional pulmonary emboli noted involving the right upper, lower and middle lobe are pulmonary arteries as well as the left upper lobe and left lower lobe pulmonary arteries. Segmental pulmonary artery filling defects are also noted within the right lower lobe. There is evidence of right heart strain. The RV to LV ratio is equal to 1.8. Reflux of contrast material is noted into the IVC and hepatic vein. Mediastinum/Nodes: Normal appearance of the thyroid gland. The trachea appears patent and is midline. Normal appearance of the esophagus., axillary, mediastinal or hilar lymph nodes. Lungs/Pleura: No pleural effusion, airspace consolidation, atelectasis or pneumothorax identified. Fibrotic changes with volume loss  identified within the anterior right lung likely representing changes secondary to external beam radiation. No suspicious  pulmonary nodule or mass identified. Upper Abdomen: Left lobe of liver cyst identified measuring 2.5 cm. 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 Musculoskeletal: There are postoperative changes from right mastectomy. There is a large area of ulceration with surrounding increased soft tissue and skin thickening involving the ventral chest wall, image 36/4. Review of the MIP images confirms the above findings. IMPRESSION: 1. Positive for acute PE with CT evidence of right heart strain (RV/LV Ratio = 1.8) consistent with at least submassive (intermediate risk) PE. The presence of right heart strain has been associated with an increased risk of morbidity and mortality. Please activate Code PE by paging [phone]. 2. Status post right mastectomy with right ventral chest wall ulceration and skin thickening. The appearance is nonspecific in may represent sequelae of post treatment changes. Recurrence of tumor would be difficult to exclude and follow-up with breast surgeon is advised. 3. Liver cyst. 4. Critical Value/emergent results were called by telephone at the time of interpretation on 08/18/2018 at 4:51 pm to Dr. Delman Kitten , who verbally acknowledged these results. Electronically Signed   By: Kerby Moors M.D.   On: 08/18/2018 16:51   US Venous Img Lower Bilateral  Result Date: 08/19/2018 CLINICAL DATA:  68 year old female with a reported history of DVT EXAM: BILATERAL LOWER EXTREMITY VENOUS DOPPLER ULTRASOUND TECHNIQUE: Gray-scale sonography with graded compression, as well as color Doppler and duplex ultrasound were performed to evaluate the lower extremity deep venous systems from the level of the common femoral vein and including the common femoral, femoral, profunda femoral, popliteal and calf veins including the posterior tibial, peroneal and gastrocnemius veins when visible. The superficial great saphenous vein was also interrogated. Spectral Doppler was utilized to evaluate  flow at rest and with distal augmentation maneuvers in the common femoral, femoral and popliteal veins. COMPARISON:  None. FINDINGS: RIGHT LOWER EXTREMITY Common Femoral Vein: No evidence of thrombus. Normal compressibility, respiratory phasicity and response to augmentation. Saphenofemoral Junction: No evidence of thrombus. Normal compressibility and flow on color Doppler imaging. Profunda Femoral Vein: No evidence of thrombus. Normal compressibility and flow on color Doppler imaging. Femoral Vein: No evidence of thrombus. Normal compressibility, respiratory phasicity and response to augmentation. Popliteal Vein: No evidence of thrombus. Normal compressibility, respiratory phasicity and response to augmentation. Calf Veins: No evidence of thrombus. Normal compressibility and flow on color Doppler imaging. Superficial Great Saphenous Vein: No evidence of thrombus. Normal compressibility. Venous Reflux:  None. Other Findings:  None. LEFT LOWER EXTREMITY Common Femoral Vein: No evidence of thrombus. Normal compressibility, respiratory phasicity and response to augmentation. Saphenofemoral Junction: No evidence of thrombus. Normal compressibility and flow on color Doppler imaging. Profunda Femoral Vein: No evidence of thrombus. Normal compressibility and flow on color Doppler imaging. Femoral Vein: No evidence of thrombus. Normal compressibility, respiratory phasicity and response to augmentation. Popliteal Vein: The popliteal vein is only partially compressible. Echogenic material is noted layering along the wall. There is evidence of some color flow on color Doppler imaging. Calf Veins: Occlusive thrombus present within the paired peroneal veins. The posterior tibial veins remain patent. Superficial Great Saphenous Vein: No evidence of thrombus. Normal compressibility. Venous Reflux:  None. Other Findings:  None. IMPRESSION: 1. Positive for acute DVT on the left with occlusive DVT in the peroneal veins in the calf  and nonocclusive DVT extending into the popliteal vein. 2. No evidence of acute DVT in  the right lower extremity. These results will be called to the ordering clinician or representative by the Radiologist Assistant, and communication documented in the PACS or zVision Dashboard. Electronically Signed   By: Jacqulynn Cadet M.D.   On: 08/19/2018 08:17   Dg Chest Portable 1 View  Result Date: 08/18/2018 CLINICAL DATA:  Dyspnea and tachycardia with syncope today. EXAM: PORTABLE CHEST 1 VIEW COMPARISON:  04/23/2016 chest CT FINDINGS: The patient is status post right mastectomy. Heart size is top-normal. Nonaneurysmal thoracic aorta is noted. No acute pulmonary consolidation, edema, effusion or pneumothorax. No aggressive osseous lesions. IMPRESSION: 1. No active disease. 2. Status post mastectomy. Electronically Signed   By: Ashley Royalty M.D.   On: 08/18/2018 16:18   ASSESSMENT AND PLAN:   1.massive pulmonary embolism with right heart strain Patient was tachycardic and significantly short of breath with minimal exertion on presentation.  Patient status post TPA with excellent clinical and hemodynamic response.  This is recurrent VTE.  Patient currently on heparin drip.  Venous Doppler ultrasound done also revealed acute DVT of the left lower extremity.  No DVT on the right.   Patient seen by vascular surgeon already.  Had IVC filter placement done today to prevent recurrent DVT  2D echocardiogram done.  To follow-up on report when read. Given patient's history of breast cancer , oncology consult already placed for further recommendations regarding anticoagulation.  To decide between therapeutic dose of Lovenox versus Eliquis.  We will follow-up on recommendations.   Patient clinically and hemodynamically stable for transfer out of ICU today  2. Elevated troponin Troponin peaked at 0.9.  Trending down to 0.83.  Currently asymptomatic.  Patient already on heparin drip. This could be due to heart strain  and pulmonary emboli.  Continue to monitor.  3.Hypothyroidism Continue levothyroxine.  4. Hyperlipidemia Continue simvastatin.  DVT prophylaxis; patient already on heparin drip    All the records are reviewed and case discussed with Care Management/Social Worker. Management plans discussed with the patient, family and they are in agreement.  CODE STATUS: DNR  TOTAL TIME TAKING CARE OF THIS PATIENT: 39 minutes.   More than 50% of the time was spent in counseling/coordination of care: YES  POSSIBLE D/C IN 2 DAYS, DEPENDING ON CLINICAL CONDITION.   Kaira Stringfield M.D on 08/19/2018 at 2:07 PM  Between 7am to 6pm - Pager - [phone]  After 6pm go to [email or web address] - Proofreader  [facility]  Office  [phone]  CC: Primary care physician; Rusty Aus, MD  Note: This dictation was prepared with Dragon dictation along with smaller phrase technology. Any transcriptional errors that result from this process are unintentional.

## 2018-08-19 NOTE — Consult Note (Signed)
[facility] Vascular Consult Note  MRN : [id number]  [name] [name] is a 68 y.o. (June 07, 1951) female who presents with chief complaint of  Chief Complaint  Patient presents with  . Near Syncope  .  History of Present Illness: I am asked to see the patient by Dr. Alva Garnet for consideration for IVC filter placement.  Patient was admitted yesterday for near syncope and impending cardiovascular collapse when she was found to have a submassive pulmonary embolus.  I have independently reviewed the CT scan and she does have large bilateral pulmonary emboli with right heart strain by CT criteria.  She underwent intravenous TPA in the emergency department and has had an excellent clinical response.  Her tachycardia and hypotension have markedly improved and her hypoxia is much better.  She remains on anticoagulation and is doing well with that so far.  She did not have any leg pain or swelling preliminary to her pulmonary embolus.  Nonetheless, appropriately a duplex scan of the lower extremities was performed today which demonstrates significant residual left lower extremity DVT in the calf and popliteal veins.  As such, further thromboembolic events could potentially be fatal so we were consulted for consideration for IVC filter.  Current Facility-Administered Medications  Medication Dose Route Frequency Provider Last Rate Last Dose  . acetaminophen (TYLENOL) tablet 500 mg  500 mg Oral Q4H PRN Vaughan Basta, MD      . cholecalciferol (VITAMIN D3) tablet 5,000 Units  5,000 Units Oral q morning - 10a Vaughan Basta, MD   5,000 Units at 08/19/18 0816  . docusate sodium (COLACE) capsule 100 mg  100 mg Oral BID PRN Vaughan Basta, MD      . fluticasone (FLONASE) 50 MCG/ACT nasal spray 2 spray  2 spray Each Nare Daily PRN Vaughan Basta, MD      . gabapentin (NEURONTIN) capsule 900 mg  900 mg Oral QHS Vaughan Basta, MD   900 mg at 08/18/18 2146   . heparin ADULT infusion 100 units/mL (25000 units/264mL sodium chloride 0.45%)  1,300 Units/hr Intravenous Continuous Charlett Nose, RPH 13 mL/hr at 08/19/18 1000 1,300 Units/hr at 08/19/18 1000  . hydrALAZINE (APRESOLINE) injection 10 mg  10 mg Intravenous Q4H PRN Darel Hong D, NP      . HYDROcodone-acetaminophen (NORCO/VICODIN) 5-325 MG per tablet 1 tablet  1 tablet Oral Q4H PRN Vaughan Basta, MD      . levothyroxine (SYNTHROID, LEVOTHROID) tablet 125 mcg  125 mcg Oral Q0600 Vaughan Basta, MD   125 mcg at 08/19/18 0610  . metroNIDAZOLE (FLAGYL) tablet 500 mg  500 mg Oral Daily Vaughan Basta, MD   500 mg at 08/19/18 0817  . pantoprazole (PROTONIX) EC tablet 40 mg  40 mg Oral Daily Vaughan Basta, MD   40 mg at 08/19/18 0817  . simvastatin (ZOCOR) tablet 20 mg  20 mg Oral q1800 Vaughan Basta, MD   20 mg at 08/18/18 2145  . temazepam (RESTORIL) capsule 15 mg  15 mg Oral QHS PRN Vaughan Basta, MD      . traMADol Veatrice Bourbon) tablet 50 mg  50 mg Oral Q6H PRN Vaughan Basta, MD      . vitamin B-12 (CYANOCOBALAMIN) tablet 500 mcg  500 mcg Oral q morning - 10a Vaughan Basta, MD   500 mcg at 08/19/18 4098    Past Medical History:  Diagnosis Date  . Anxiety   . Cancer [facility])    2006 right side breast ca. lumpectomy, skin ca on same  breast   . GERD (gastroesophageal reflux disease)   . Hypercholesteremia   . Peripheral neuropathic pain   . Personal history of chemotherapy    2006/2014  . Personal history of radiation therapy    2006/2014  . Pulmonary emboli ([place])   . Urinary incontinence     Past Surgical History:  Procedure Laterality Date  . APPENDECTOMY    . CARDIAC CATHETERIZATION Left 05/23/2016   Procedure: Left Heart Cath and Coronary Angiography;  Surgeon: Isaias Cowman, MD;  Location: [facility];  Service: Cardiovascular;  Laterality: Left;  Marland Kitchen MASTECTOMY Right 2006/2014  . rt breast removal      cancer    Social History Social History   Tobacco Use  . Smoking status: Never Smoker  . Smokeless tobacco: Never Used  Substance Use Topics  . Alcohol use: Never    Frequency: Never  . Drug use: No    Family History Family History  Problem Relation Age of Onset  . Asthma Mother   . COPD Mother   . CVA Father   . Heart attack Father   . Coronary artery disease Father   . Schizophrenia Brother   . Peripheral vascular disease Brother   . Breast cancer Neg Hx     No Known Allergies   REVIEW OF SYSTEMS (Negative unless checked)  Constitutional: [] Weight loss  [] Fever  [] Chills Cardiac: [] Chest pain   [] Chest pressure   [x] Palpitations   [] Shortness of breath when laying flat   [x] Shortness of breath at rest   [x] Shortness of breath with exertion. Vascular:  [] Pain in legs with walking   [] Pain in legs at rest   [] Pain in legs when laying flat   [] Claudication   [] Pain in feet when walking  [] Pain in feet at rest  [] Pain in feet when laying flat   [x] History of DVT   [x] Phlebitis   [] Swelling in legs   [] Varicose veins   [x] Non-healing ulcers Pulmonary:   [] Uses home oxygen   [] Productive cough   [] Hemoptysis   [] Wheeze  [] COPD   [] Asthma Neurologic:  [] Dizziness  [x] Blackouts   [] Seizures   [] History of stroke   [] History of TIA  [] Aphasia   [] Temporary blindness   [] Dysphagia   [] Weakness or numbness in arms   [] Weakness or numbness in legs Musculoskeletal:  [] Arthritis   [] Joint swelling   [] Joint pain   [] Low back pain Hematologic:  [] Easy bruising  [] Easy bleeding   [] Hypercoagulable state   [] Anemic  [] Hepatitis Gastrointestinal:  [] Blood in stool   [] Vomiting blood  [x] Gastroesophageal reflux/heartburn   [] Difficulty swallowing. Genitourinary:  [] Chronic kidney disease   [] Difficult urination  [] Frequent urination  [] Burning with urination   [] Blood in urine Skin:  [] Rashes   [x] Ulcers   [x] Wounds Psychological:  [x] History of anxiety   []  History of major  depression.  Physical Examination  Vitals:   08/19/18 0800 08/19/18 0900 08/19/18 1000 08/19/18 1100  BP:  (!) 147/70 (!) 153/77 138/60  Pulse:  97 95 81  Resp:  19 14 18   Temp: 97.9 F (36.6 C)     TempSrc: Oral     SpO2:  92% 92% 97%  Weight:      Height:       Body mass index is 33.35 kg/m. Gen:  WD/WN, NAD Head: [place]/AT, No temporalis wasting.  Ear/Nose/Throat: Hearing grossly intact, nares w/o erythema or drainage, oropharynx w/o Erythema/Exudate Eyes: Sclera non-icteric, conjunctiva clear Neck: Trachea midline.   Pulmonary:  Good  air movement, respirations not labored, no use of accessory muscles.  Cardiac: RRR, no JVD Vascular:  Vessel Right Left  Radial Palpable Palpable                          PT Palpable Palpable  DP Palpable Palpable   Gastrointestinal: soft, non-tender/non-distended. No guarding/reflex.  Musculoskeletal: M/S 5/5 throughout.  Extremities without ischemic changes.  No deformity or atrophy. Mild LLE edema. Neurologic: Sensation grossly intact in extremities.  Symmetrical.  Speech is fluent. Motor exam as listed above. Psychiatric: Judgment intact, Mood & affect appropriate for pt's clinical situation. Dermatologic: Chronic wound right chest       CBC Lab Results  Component Value Date   WBC 7.0 08/19/2018   HGB 14.1 08/19/2018   HCT 44.3 08/19/2018   MCV 87.9 08/19/2018   PLT 183 08/19/2018    BMET    Component Value Date/Time   NA 146 (H) 08/19/2018 0348   K 3.7 08/19/2018 0348   CL 111 08/19/2018 0348   CO2 27 08/19/2018 0348   GLUCOSE 120 (H) 08/19/2018 0348   BUN 12 08/19/2018 0348   CREATININE 0.95 08/19/2018 0348   CALCIUM 8.4 (L) 08/19/2018 0348   GFRNONAA >60 08/19/2018 0348   GFRAA >60 08/19/2018 0348   Estimated Creatinine Clearance: 66.7 mL/min (by C-G formula based on SCr of 0.95 mg/dL).  COAG Lab Results  Component Value Date   INR 1.03 08/18/2018    Radiology Ct Angio Chest Pe W And/or Wo  Contrast  Result Date: 08/18/2018 CLINICAL DATA:  Syncope. Sudden onset of dyspnea. Prior history of PE. EXAM: CT ANGIOGRAPHY CHEST WITH CONTRAST TECHNIQUE: Multidetector CT imaging of the chest was performed using the standard protocol during bolus administration of intravenous contrast. Multiplanar CT image reconstructions and MIPs were obtained to evaluate the vascular anatomy. CONTRAST:  84mL OMNIPAQUE IOHEXOL 350 MG/ML SOLN COMPARISON:  CT chest 04/23/2016 FINDINGS: Cardiovascular: Heart size is normal. There is a large saddle embolus which crosses the midline. Additional pulmonary emboli noted involving the right upper, lower and middle lobe are pulmonary arteries as well as the left upper lobe and left lower lobe pulmonary arteries. Segmental pulmonary artery filling defects are also noted within the right lower lobe. There is evidence of right heart strain. The RV to LV ratio is equal to 1.8. Reflux of contrast material is noted into the IVC and hepatic vein. Mediastinum/Nodes: Normal appearance of the thyroid gland. The trachea appears patent and is midline. Normal appearance of the esophagus., axillary, mediastinal or hilar lymph nodes. Lungs/Pleura: No pleural effusion, airspace consolidation, atelectasis or pneumothorax identified. Fibrotic changes with volume loss identified within the anterior right lung likely representing changes secondary to external beam radiation. No suspicious pulmonary nodule or mass identified. Upper Abdomen: Left lobe of liver cyst identified measuring 2.5 cm. 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 Musculoskeletal: There are postoperative changes from right mastectomy. There is a large area of ulceration with surrounding increased soft tissue and skin thickening involving the ventral chest wall, image 36/4. Review of the MIP images confirms the above findings. IMPRESSION: 1. Positive for acute PE with CT evidence of right heart strain (RV/LV Ratio  = 1.8) consistent with at least submassive (intermediate risk) PE. The presence of right heart strain has been associated with an increased risk of morbidity and mortality. Please activate Code PE by paging [phone]. 2. Status post right mastectomy with right ventral chest wall  ulceration and skin thickening. The appearance is nonspecific in may represent sequelae of post treatment changes. Recurrence of tumor would be difficult to exclude and follow-up with breast surgeon is advised. 3. Liver cyst. 4. Critical Value/emergent results were called by telephone at the time of interpretation on 08/18/2018 at 4:51 pm to Dr. Delman Kitten , who verbally acknowledged these results. Electronically Signed   By: Kerby Moors M.D.   On: 08/18/2018 16:51   US Venous Img Lower Bilateral  Result Date: 08/19/2018 CLINICAL DATA:  68 year old female with a reported history of DVT EXAM: BILATERAL LOWER EXTREMITY VENOUS DOPPLER ULTRASOUND TECHNIQUE: Gray-scale sonography with graded compression, as well as color Doppler and duplex ultrasound were performed to evaluate the lower extremity deep venous systems from the level of the common femoral vein and including the common femoral, femoral, profunda femoral, popliteal and calf veins including the posterior tibial, peroneal and gastrocnemius veins when visible. The superficial great saphenous vein was also interrogated. Spectral Doppler was utilized to evaluate flow at rest and with distal augmentation maneuvers in the common femoral, femoral and popliteal veins. COMPARISON:  None. FINDINGS: RIGHT LOWER EXTREMITY Common Femoral Vein: No evidence of thrombus. Normal compressibility, respiratory phasicity and response to augmentation. Saphenofemoral Junction: No evidence of thrombus. Normal compressibility and flow on color Doppler imaging. Profunda Femoral Vein: No evidence of thrombus. Normal compressibility and flow on color Doppler imaging. Femoral Vein: No evidence of  thrombus. Normal compressibility, respiratory phasicity and response to augmentation. Popliteal Vein: No evidence of thrombus. Normal compressibility, respiratory phasicity and response to augmentation. Calf Veins: No evidence of thrombus. Normal compressibility and flow on color Doppler imaging. Superficial Great Saphenous Vein: No evidence of thrombus. Normal compressibility. Venous Reflux:  None. Other Findings:  None. LEFT LOWER EXTREMITY Common Femoral Vein: No evidence of thrombus. Normal compressibility, respiratory phasicity and response to augmentation. Saphenofemoral Junction: No evidence of thrombus. Normal compressibility and flow on color Doppler imaging. Profunda Femoral Vein: No evidence of thrombus. Normal compressibility and flow on color Doppler imaging. Femoral Vein: No evidence of thrombus. Normal compressibility, respiratory phasicity and response to augmentation. Popliteal Vein: The popliteal vein is only partially compressible. Echogenic material is noted layering along the wall. There is evidence of some color flow on color Doppler imaging. Calf Veins: Occlusive thrombus present within the paired peroneal veins. The posterior tibial veins remain patent. Superficial Great Saphenous Vein: No evidence of thrombus. Normal compressibility. Venous Reflux:  None. Other Findings:  None. IMPRESSION: 1. Positive for acute DVT on the left with occlusive DVT in the peroneal veins in the calf and nonocclusive DVT extending into the popliteal vein. 2. No evidence of acute DVT in the right lower extremity. These results will be called to the ordering clinician or representative by the Radiologist Assistant, and communication documented in the PACS or zVision Dashboard. Electronically Signed   By: Jacqulynn Cadet M.D.   On: 08/19/2018 08:17   Dg Chest Portable 1 View  Result Date: 08/18/2018 CLINICAL DATA:  Dyspnea and tachycardia with syncope today. EXAM: PORTABLE CHEST 1 VIEW COMPARISON:  04/23/2016  chest CT FINDINGS: The patient is status post right mastectomy. Heart size is top-normal. Nonaneurysmal thoracic aorta is noted. No acute pulmonary consolidation, edema, effusion or pneumothorax. No aggressive osseous lesions. IMPRESSION: 1. No active disease. 2. Status post mastectomy. Electronically Signed   By: Ashley Royalty M.D.   On: 08/18/2018 16:18      Assessment/Plan 1.  Submassive PE with residual left lower extremity DVT of  significant size.  Given her improvement with TPA intravenously, catheter directed thrombolytic therapy is not necessary at this time.  An IVC filter is certainly a reasonable option as further thromboembolic events could be fatal.  I have discussed the procedure with the patient.  I discussed the risks and benefits.  I discussed that this could be potentially a removable filter, but given her long-term high risk status leaving the filter in indefinitely as an option.  The patient prefers leaving the filter in.  This will be done either later today or tomorrow depending on room availability.  The patient is clinically stable at this time for procedure 2.  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  This is likely an increase in her thromboembolic risk. 3. Hyperlipidemia. lipid control important in reducing the progression of atherosclerotic disease. Continue statin therapy    Leotis Pain, MD  08/19/2018 11:24 AM    This note was created with Dragon medical transcription system.  Any error is purely unintentional

## 2018-08-19 NOTE — Consult Note (Addendum)
[place] Nurse wound consult note Reason for Consult: Consult requested for right chronic chest wound; located over site of previous mastectomy surgery site. Pt states she is followed at the outpatient wound care clinic at [facility] and they have ordered Sorbact packing strips. Informed patient and husband at the bedside that this topical treatment is not available in the [place].  Will substitute Iodoform packing strip and they can resume previous plan of care after discharge; they verbalized understanding.  Wound type: Full thickness wound 1.2X2X.5cm with tunneling area to inner wound 1cm Drainage (amount, consistency, odor) Mod amt blood-tinged drainage; pt states drainage did not have this appearance until a blood thinner was started recently. Periwound: Pink dry scar tissue surrounding the wound, hard to palpation, which is located in a crease near the underarm Dressing procedure/placement/frequency: Change right chest wound Q day and PRN if drainage has saturated dressing as follows: Cleanse with NS, then gently apply Iodoform packing strip into tunneling area, using swab to fill, then cover with foam dressing. PT'S HUSBAND may perform dressing change if desired.  Please re-consult if further assistance is needed.  Thank-you,  Julien Girt MSN, [place], [place], [place], [place]

## 2018-08-19 NOTE — Progress Notes (Addendum)
Report called to [facility] on 2A, pt will transport in ICU bed and change to 2A bed in room.  VSS on room air, Purewick in place, hep infusing at 1450 units/hr Patient and family requests to change patient code status back to Full Code d/t 24 hrs post TPA admin.  Per Hinton Dyer NP please enter the order and DC DNR order

## 2018-08-19 NOTE — Progress Notes (Signed)
PULMONARY/CCM PROGRESS NOTE  PT PROFILE: 68 y.o. with remote history of breast cancer which was complicated by venous thromboembolism during the course of treatment.  Scented to [facility] ED with acute onset of dyspnea and profound weakness.  CTA chest demonstrated massive pulmonary embolism.  Patient received tPA with excellent improvement in symptoms and hemodynamics.  Admitted to ICU/SDU for monitoring post tPA  MAJOR EVENTS/TEST RESULTS: 1/21 admission as noted above 1/21 CTA chest: massive saddle embolism with severe RV dilatation 1/22 echocardiogram: 1/22 BLE venous US: acute DVT on the left with occlusive DVT in the peroneal veins in the calf and nonocclusive DVT extending into the popliteal vein. 1/22 Vascular surgery consult for IVC filter placement  INDWELLING DEVICES:: None  MICRO DATA: MRSA PCR 1/21>> negative  ANTIMICROBIALS:  None  SUBJ: Patient is remarkably comfortable on room air this morning.  She denies chest pain and dyspnea.  She has no new complaints.  OBJ: Vitals:   08/19/18 1000 08/19/18 1100 08/19/18 1200 08/19/18 1213  BP: (!) 153/77 138/60 (!) 152/67 (!) 157/70  Pulse: 95 81 85 90  Resp: 14 18 (!) 22 16  Temp:   99.1 F (37.3 C) 98.5 F (36.9 C)  TempSrc:   Oral Oral  SpO2: 92% 97% 90% 97%  Weight:    96.6 kg  Height:    5\' 7"  (1.702 m)  Room air  Gen: NAD HEENT: NCAT, sclerae white Neck: No LAN, no JVD noted Lungs: Clear anteriorly, no adventitious sounds Cardiovascular: Regular with occasional extrasystoles, no murmur noted Abdomen: Soft, NT, +BS Ext: Warm, no pitting edema Neuro: PERRL, EOMI, motor/sensory grossly intact Skin: No lesions noted   BMP Latest Ref Rng & Units 08/19/2018 08/18/2018 04/23/2016  Glucose 70 - 99 mg/dL 120(H) 175(H) -  BUN 8 - 23 mg/dL 12 13 -  Creatinine 0.44 - 1.00 mg/dL 0.95 1.04(H) 1.00  Sodium 135 - 145 mmol/L 146(H) 140 -  Potassium 3.5 - 5.1 mmol/L 3.7 3.7 -  Chloride 98 - 111 mmol/L 111 106 -  CO2 22 - 32  mmol/L 27 24 -  Calcium 8.9 - 10.3 mg/dL 8.4(L) 8.6(L) -    No flowsheet data found.  CBC Latest Ref Rng & Units 08/19/2018 08/18/2018  WBC 4.0 - 10.5 K/uL 7.0 7.8  Hemoglobin 12.0 - 15.0 g/dL 14.1 14.8  Hematocrit 36.0 - 46.0 % 44.3 45.3  Platelets 150 - 400 K/uL 183 174    ABG No results found for: PHART, PCO2ART, PO2ART, HCO3, TCO2, ACIDBASEDEF, O2SAT  CXR: No new film   IMPRESSION: History of breast cancer Massive pulmonary embolism, s/p tPA with excellent clinical and hemodynamic response  This is recurrent VTE Residual LLE DVT  PLAN/REC: I have consulted vascular surgery for IVC filter placement Continue heparin for now She will need lifelong anticoagulation Follow-up echocardiogram results After IVC filter placement, she can safely be transferred to MedSurg floor with cardiac monitoring   Merton Border, MD PCCM service Mobile [phone] Pager [phone] 08/19/2018 1:08 PM

## 2018-08-19 NOTE — Progress Notes (Signed)
[place] for heparin drip Indication: pulmonary embolus  No Known Allergies  Patient Measurements: Height: 5\' 7"  (170.2 cm) Weight: 212 lb 15.4 oz (96.6 kg) IBW/kg (Calculated) : 61.6 Heparin Dosing Weight: 83 kg  Vital Signs: Temp: 98.5 F (36.9 C) (01/22 1500) Temp Source: Oral (01/22 1500) BP: 169/70 (01/22 1600) Pulse Rate: 95 (01/22 1600)  Labs: Recent Labs    08/18/18 1509 08/18/18 2036  08/19/18 0348 08/19/18 0827 08/19/18 1534  HGB 14.8  --   --  14.1  --   --   HCT 45.3  --   --  44.3  --   --   PLT 174  --   --  183  --   --   APTT 33 109*   < > 83* 66* 46*  LABPROT 13.4  --   --   --   --   --   INR 1.03  --   --   --   --   --   HEPARINUNFRC  --   --   --   --  0.24* 0.15*  CREATININE 1.04*  --   --  0.95  --   --   TROPONINI 0.12* 0.90*  --  0.83*  --   --    < > = values in this interval not displayed.    Estimated Creatinine Clearance: 66.7 mL/min (by C-G formula based on SCr of 0.95 mg/dL).   Medical History: Past Medical History:  Diagnosis Date  . Anxiety   . Cancer [facility])    2006 right side breast ca. 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 GERD (gastroesophageal reflux disease)   . Hypercholesteremia   . Peripheral neuropathic pain   . Personal history of chemotherapy    2006/2014  . Personal history of radiation therapy    2006/2014  . Pulmonary emboli ([place])   . Urinary incontinence     Medications:  Pharmacy consulted for heparin drip management for 68 yo female admitted with PE/DVT and received alteplase at 1805 on 1/22. Patient received IVC filter on 1/22. Patient receiving heparin at 1300 units/hr.   Assessment:  Goal of Therapy:  Heparin level goal 0.3-0.5 for first 24 hours, then 0.3-0.7. Monitor platelets by anticoagulation protocol: Yes   Plan:  Will increase heparin to 1450 units/hr and recheck heparin level at 2230.   Pharmacy will continue to monitor and adjust per consult.    Magaret Justo L 08/19/2018,4:28 PM

## 2018-08-20 ENCOUNTER — Encounter: Payer: Self-pay | Admitting: Vascular Surgery

## 2018-08-20 DIAGNOSIS — I2692 Saddle embolus of pulmonary artery without acute cor pulmonale: Secondary | ICD-10-CM

## 2018-08-20 DIAGNOSIS — Z853 Personal history of malignant neoplasm of breast: Secondary | ICD-10-CM

## 2018-08-20 DIAGNOSIS — Z95828 Presence of other vascular implants and grafts: Secondary | ICD-10-CM

## 2018-08-20 LAB — BASIC METABOLIC PANEL
ANION GAP: 8 (ref 5–15)
BUN: 11 mg/dL (ref 8–23)
CO2: 26 mmol/L (ref 22–32)
Calcium: 8.4 mg/dL — ABNORMAL LOW (ref 8.9–10.3)
Chloride: 108 mmol/L (ref 98–111)
Creatinine, Ser: 0.96 mg/dL (ref 0.44–1.00)
GFR calc Af Amer: >60 mL/min (ref >60)
GFR calc non Af Amer: >60 mL/min (ref >60)
Glucose, Bld: 137 mg/dL — ABNORMAL HIGH (ref 70–99)
POTASSIUM: 3.3 mmol/L — AB (ref 3.5–5.1)
Sodium: 142 mmol/L (ref 135–145)

## 2018-08-20 LAB — CBC
HCT: 42.5 % (ref 36.0–46.0)
HEMOGLOBIN: 13.9 g/dL (ref 12.0–15.0)
MCH: 28.2 pg (ref 26.0–34.0)
MCHC: 32.7 g/dL (ref 30.0–36.0)
MCV: 86.2 fL (ref 80.0–100.0)
Platelets: 176 10*3/uL (ref 150–400)
RBC: 4.93 MIL/uL (ref 3.87–5.11)
RDW: 13.3 % (ref 11.5–15.5)
WBC: 7.1 10*3/uL (ref 4.0–10.5)
nRBC: 0 % (ref 0.0–0.2)

## 2018-08-20 LAB — MAGNESIUM: Magnesium: 1.7 mg/dL (ref 1.7–2.4)

## 2018-08-20 LAB — HIV ANTIBODY (ROUTINE TESTING W REFLEX): HIV Screen 4th Generation wRfx: NONREACTIVE

## 2018-08-20 LAB — HEPARIN LEVEL (UNFRACTIONATED): Heparin Unfractionated: 0.61 IU/mL (ref 0.30–0.70)

## 2018-08-20 MED ORDER — POTASSIUM CHLORIDE CRYS ER 20 MEQ PO TBCR
40.0000 meq | EXTENDED_RELEASE_TABLET | Freq: Once | ORAL | Status: AC
  Administered 2018-08-20: 40 meq via ORAL
  Filled 2018-08-20: qty 2

## 2018-08-20 NOTE — Progress Notes (Signed)
Patient and husband have elected to defer Q daily wound care for today, stating that the W.O.C. R.N. told them that the dressing on her armpit now will last up to 72 hours. They stated they may prefer to have the wound dressing changed tomorrow, and either way they would like some additional supplies if the patient is to discharge tomorrow. Will pass along in shift report. Current dressing appears clean, dry, and intact. Will continue to monitor wound dressing. Wenda Low [facility]

## 2018-08-20 NOTE — Progress Notes (Signed)
[facility] Daily Progress Note   Subjective: 1 Day Post-Op:   1.   Ultrasound guidance for vascular access to the right femoral vein 2.   Catheter placement into the inferior vena cava 3.   Inferior venacavogram 4.   Placement of a Cook Celect IVC filter  Patient without complaint this AM. No issues overnight. Transferred out of ICU to telemetry.   Objective: Vitals:   08/19/18 1836 08/19/18 1943 08/20/18 0339 08/20/18 0736  BP: (!) 151/78 (!) 153/76 (!) 151/57 (!) 143/88  Pulse: 92 96 98 98  Resp: 19 18 18 18   Temp: 98.2 F (36.8 C) 97.7 F (36.5 C) 97.8 F (36.6 C) (!) 97.4 F (36.3 C)  TempSrc: Oral Oral Oral Oral  SpO2: 97% 96% 94% 99%  Weight:      Height:        Intake/Output Summary (Last 24 hours) at 08/20/2018 0954 Last data filed at 08/20/2018 0932 Gross per 24 hour  Intake 175.33 ml  Output 200 ml  Net -24.67 ml   Physical Exam: A&Ox3, NAD CV: RRR Pulmonary: CTA Bilaterally Abdomen: Soft, Nontender, Nondistended Right groin: Procedure dressing intact. Clean and dry. No swelling or ecchymosis noted.  Vascular:  Right Lower Extremity: Thigh soft, calf soft. Non-tender Minimal edema.  Left Lower Extremity: Thigh soft, calf soft. Non-tender, Mild edema. No acute vascular compromise noted to the extremity.   Laboratory: CBC    Component Value Date/Time   WBC 7.1 08/20/2018 0445   HGB 13.9 08/20/2018 0445   HCT 42.5 08/20/2018 0445   PLT 176 08/20/2018 0445   BMET    Component Value Date/Time   NA 142 08/20/2018 0445   K 3.3 (L) 08/20/2018 0445   CL 108 08/20/2018 0445   CO2 26 08/20/2018 0445   GLUCOSE 137 (H) 08/20/2018 0445   BUN 11 08/20/2018 0445   CREATININE 0.96 08/20/2018 0445   CALCIUM 8.4 (L) 08/20/2018 0445   GFRNONAA >60 08/20/2018 0445   GFRAA >60 08/20/2018 0445   Assessment/Planning: The patient is a 68 year old female with DVT and PE s/p IVC fitler POD #1 - Stable 1) Right groin access site healing  well. 2) Procedure, findings, post-procedure, post-discharge planning discussed with patient. She expressed understanding and is in agreement.  2) Patient to follow up as an outpatient to follow DVT / IVC filter. Will place info in discharge. 3) Normally would recommend transition from heparin to Eliquis however will defer that to oncology.  4) Vascular surgery to sign off at this time. Please re-consult if needed.  Discussed with Dr. Ellis Parents [name] Roscoe PA-C 08/20/2018 9:54 AM

## 2018-08-20 NOTE — Plan of Care (Signed)
  Problem: Clinical Measurements: Goal: Ability to maintain clinical measurements within normal limits will improve Outcome: Not Progressing Note:  Patient's potassium level is low today at only 3.3. Will continue to monitor lab values. Already orally replaced. Wenda Low [facility]

## 2018-08-20 NOTE — Consult Note (Signed)
[place]  [phone] [phone]  ID: HARNOOR RETA OB: 10-11-1950  MR#: [id number]  AOZ#:[id number]  Patient Care Team: Rusty Aus, MD as PCP - General (Internal Medicine)  CHIEF COMPLAINT: Massive bilateral PE status post thrombolytics, history of breast cancer.  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  Subsequent imaging revealed massive bilateral PE.  Patient received thrombolytics in the emergency room with significant improvement of her symptoms.  She continues to feel increased weakness and fatigue, but feels significantly improved since admission.  She has no neurologic complaints.  She denies any fevers or illnesses.  She denies any cough or hemoptysis.  She has good appetite and denies weight loss.  She has no nausea, vomiting, constipation, or diarrhea.  She has no urinary complaints.  Patient otherwise feels well and offers no further specific complaints.  REVIEW OF SYSTEMS:   Review of Systems  Constitutional: Positive for malaise/fatigue. Negative for fever and weight loss.  Respiratory: Negative.  Negative for cough, hemoptysis and shortness of breath.   Cardiovascular: Negative.  Negative for leg swelling.  Gastrointestinal: Negative.  Negative for abdominal pain.  Genitourinary: Negative.  Negative for dysuria.  Musculoskeletal: Negative.  Negative for back pain.  Skin: Negative.  Negative for rash.  Neurological: Positive for weakness. Negative for dizziness, focal weakness and headaches.  Psychiatric/Behavioral: Negative.  The patient is not nervous/anxious.     As per HPI. Otherwise, a complete review of systems is negative.  PAST MEDICAL HISTORY: Past Medical History:  Diagnosis Date  . Anxiety    . Cancer [facility])    2006 right side breast ca. 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 GERD (gastroesophageal reflux disease)   . Hypercholesteremia   . Peripheral neuropathic pain   . Personal history of chemotherapy    2006/2014  . Personal history of radiation therapy    2006/2014  . Pulmonary emboli ([place])   . Urinary incontinence     PAST SURGICAL HISTORY: Past Surgical History:  Procedure Laterality Date  . APPENDECTOMY    . CARDIAC CATHETERIZATION Left 05/23/2016   Procedure: Left Heart Cath and Coronary Angiography;  Surgeon: Isaias Cowman, MD;  Location: [facility];  Service: Cardiovascular;  Laterality: Left;  . IVC FILTER INSERTION N/A 08/19/2018   Procedure: IVC FILTER INSERTION;  Surgeon: Algernon Huxley, MD;  Location: [facility];  Service: Cardiovascular;  Laterality: N/A;  . MASTECTOMY Right 2006/2014  . rt breast removal     cancer    FAMILY HISTORY: Family History  Problem Relation Age of Onset  . Asthma Mother   . COPD Mother   . CVA Father   . Heart attack Father   . Coronary artery disease Father   . Schizophrenia Brother   . Peripheral vascular disease Brother   . Breast cancer Neg Hx     ADVANCED DIRECTIVES (Y/N):  @ADVDIR @  HEALTH MAINTENANCE: Social History   Tobacco Use  . Smoking status: Never Smoker  . Smokeless tobacco: Never Used  Substance Use Topics  . Alcohol use: Never    Frequency: Never  . Drug use: No     Colonoscopy:  PAP:  Bone density:  Lipid panel:  No Known Allergies  Current Facility-Administered Medications  Medication Dose  Route Frequency Provider Last Rate Last Dose  . acetaminophen (TYLENOL) tablet 500 mg  500 mg Oral Q4H PRN Algernon Huxley, MD      . ceFAZolin (ANCEF) IVPB 1 g/50 mL premix  1 g Intravenous On Call Algernon Huxley, MD      . cholecalciferol (VITAMIN D3) tablet 5,000 Units  5,000 Units Oral q morning - 10a Algernon Huxley, MD   5,000 Units at 08/20/18 [phone]  . docusate sodium  (COLACE) capsule 100 mg  100 mg Oral BID PRN Algernon Huxley, MD      . fluticasone (FLONASE) 50 MCG/ACT nasal spray 2 spray  2 spray Each Nare Daily PRN Algernon Huxley, MD      . gabapentin (NEURONTIN) capsule 900 mg  900 mg Oral QHS Algernon Huxley, MD   900 mg at 08/19/18 2151  . heparin ADULT infusion 100 units/mL (25000 units/28mL sodium chloride 0.45%)  1,450 Units/hr Intravenous Continuous Charlett Nose, RPH 14.5 mL/hr at 08/20/18 1922 1,450 Units/hr at 08/20/18 1922  . hydrALAZINE (APRESOLINE) injection 10 mg  10 mg Intravenous Q4H PRN Algernon Huxley, MD      . HYDROcodone-acetaminophen (NORCO/VICODIN) 5-325 MG per tablet 1 tablet  1 tablet Oral Q4H PRN Awilda Bill, NP      . HYDROmorphone (DILAUDID) injection 1 mg  1 mg Intravenous Once PRN Stegmayer, Janalyn Harder, PA-C      . levothyroxine (SYNTHROID, LEVOTHROID) tablet 125 mcg  125 mcg Oral Q0600 Algernon Huxley, MD   125 mcg at 08/20/18 [phone]  . ondansetron (ZOFRAN) injection 4 mg  4 mg Intravenous Q6H PRN Stegmayer, Kimberly A, PA-C      . pantoprazole (PROTONIX) EC tablet 40 mg  40 mg Oral Daily Algernon Huxley, MD   40 mg at 08/20/18 7035  . simvastatin (ZOCOR) tablet 20 mg  20 mg Oral q1800 Algernon Huxley, MD   20 mg at 08/20/18 1724  . temazepam (RESTORIL) capsule 15 mg  15 mg Oral QHS PRN Algernon Huxley, MD      . vitamin B-12 (CYANOCOBALAMIN) tablet 500 mcg  500 mcg Oral q morning - 10a Algernon Huxley, MD   500 mcg at 08/20/18 0093    OBJECTIVE: Vitals:   08/20/18 1617 08/20/18 1944  BP: (!) 142/73 (!) 147/86  Pulse: 92 93  Resp: 16 (!) 22  Temp:  98.1 F (36.7 C)  SpO2: 99% 95%     Body mass index is 34.2 kg/m.    ECOG FS:1 - Symptomatic but completely ambulatory  General: Well-developed, well-nourished, no acute distress. Eyes: Pink conjunctiva, anicteric sclera. HEENT: Normocephalic, moist mucous membranes, clear oropharnyx. Lungs: Clear to auscultation bilaterally. Heart: Regular rate and rhythm. No rubs, murmurs, or  gallops. Abdomen: Soft, nontender, nondistended. No organomegaly noted, normoactive bowel sounds. Musculoskeletal: No edema, cyanosis, or clubbing. Neuro: Alert, answering all questions appropriately. Cranial nerves grossly intact. Skin: No rashes or petechiae noted. Psych: Normal affect. Lymphatics: No cervical, calvicular, axillary or inguinal LAD.   LAB RESULTS:  Lab Results  Component Value Date   NA 142 08/20/2018   K 3.3 (L) 08/20/2018   CL 108 08/20/2018   CO2 26 08/20/2018   GLUCOSE 137 (H) 08/20/2018   BUN 11 08/20/2018   CREATININE 0.96 08/20/2018   CALCIUM 8.4 (L) 08/20/2018   GFRNONAA >60 08/20/2018   GFRAA >60 08/20/2018    Lab Results  Component Value Date   WBC 7.1 08/20/2018  HGB 13.9 08/20/2018   HCT 42.5 08/20/2018   MCV 86.2 08/20/2018   PLT 176 08/20/2018     STUDIES: Ct Angio Chest Pe W And/or Wo Contrast  Result Date: 08/18/2018 CLINICAL DATA:  Syncope. Sudden onset of dyspnea. Prior history of PE. EXAM: CT ANGIOGRAPHY CHEST WITH CONTRAST TECHNIQUE: Multidetector CT imaging of the chest was performed using the standard protocol during bolus administration of intravenous contrast. Multiplanar CT image reconstructions and MIPs were obtained to evaluate the vascular anatomy. CONTRAST:  68mL OMNIPAQUE IOHEXOL 350 MG/ML SOLN COMPARISON:  CT chest 04/23/2016 FINDINGS: Cardiovascular: Heart size is normal. There is a large saddle embolus which crosses the midline. Additional pulmonary emboli noted involving the right upper, lower and middle lobe are pulmonary arteries as well as the left upper lobe and left lower lobe pulmonary arteries. Segmental pulmonary artery filling defects are also noted within the right lower lobe. There is evidence of right heart strain. The RV to LV ratio is equal to 1.8. Reflux of contrast material is noted into the IVC and hepatic vein. Mediastinum/Nodes: Normal appearance of the thyroid gland. The trachea appears patent and is  midline. Normal appearance of the esophagus., axillary, mediastinal or hilar lymph nodes. Lungs/Pleura: No pleural effusion, airspace consolidation, atelectasis or pneumothorax identified. Fibrotic changes with volume loss identified within the anterior right lung likely representing changes secondary to external beam radiation. No suspicious pulmonary nodule or mass identified. Upper Abdomen: Left lobe of liver cyst identified measuring 2.5 cm. 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 Musculoskeletal: There are postoperative changes from right mastectomy. There is a large area of ulceration with surrounding increased soft tissue and skin thickening involving the ventral chest wall, image 36/4. Review of the MIP images confirms the above findings. IMPRESSION: 1. Positive for acute PE with CT evidence of right heart strain (RV/LV Ratio = 1.8) consistent with at least submassive (intermediate risk) PE. The presence of right heart strain has been associated with an increased risk of morbidity and mortality. Please activate Code PE by paging [phone]. 2. Status post right mastectomy with right ventral chest wall ulceration and skin thickening. The appearance is nonspecific in may represent sequelae of post treatment changes. Recurrence of tumor would be difficult to exclude and follow-up with breast surgeon is advised. 3. Liver cyst. 4. Critical Value/emergent results were called by telephone at the time of interpretation on 08/18/2018 at 4:51 pm to Dr. Delman Kitten , who verbally acknowledged these results. Electronically Signed   By: Kerby Moors M.D.   On: 08/18/2018 16:51   US Venous Img Lower Bilateral  Result Date: 08/19/2018 CLINICAL DATA:  68 year old female with a reported history of DVT EXAM: BILATERAL LOWER EXTREMITY VENOUS DOPPLER ULTRASOUND TECHNIQUE: Gray-scale sonography with graded compression, as well as color Doppler and duplex ultrasound were performed to evaluate the  lower extremity deep venous systems from the level of the common femoral vein and including the common femoral, femoral, profunda femoral, popliteal and calf veins including the posterior tibial, peroneal and gastrocnemius veins when visible. The superficial great saphenous vein was also interrogated. Spectral Doppler was utilized to evaluate flow at rest and with distal augmentation maneuvers in the common femoral, femoral and popliteal veins. COMPARISON:  None. FINDINGS: RIGHT LOWER EXTREMITY Common Femoral Vein: No evidence of thrombus. Normal compressibility, respiratory phasicity and response to augmentation. Saphenofemoral Junction: No evidence of thrombus. Normal compressibility and flow on color Doppler imaging. Profunda Femoral Vein: No evidence of thrombus. Normal compressibility and  flow on color Doppler imaging. Femoral Vein: No evidence of thrombus. Normal compressibility, respiratory phasicity and response to augmentation. Popliteal Vein: No evidence of thrombus. Normal compressibility, respiratory phasicity and response to augmentation. Calf Veins: No evidence of thrombus. Normal compressibility and flow on color Doppler imaging. Superficial Great Saphenous Vein: No evidence of thrombus. Normal compressibility. Venous Reflux:  None. Other Findings:  None. LEFT LOWER EXTREMITY Common Femoral Vein: No evidence of thrombus. Normal compressibility, respiratory phasicity and response to augmentation. Saphenofemoral Junction: No evidence of thrombus. Normal compressibility and flow on color Doppler imaging. Profunda Femoral Vein: No evidence of thrombus. Normal compressibility and flow on color Doppler imaging. Femoral Vein: No evidence of thrombus. Normal compressibility, respiratory phasicity and response to augmentation. Popliteal Vein: The popliteal vein is only partially compressible. Echogenic material is noted layering along the wall. There is evidence of some color flow on color Doppler imaging.  Calf Veins: Occlusive thrombus present within the paired peroneal veins. The posterior tibial veins remain patent. Superficial Great Saphenous Vein: No evidence of thrombus. Normal compressibility. Venous Reflux:  None. Other Findings:  None. IMPRESSION: 1. Positive for acute DVT on the left with occlusive DVT in the peroneal veins in the calf and nonocclusive DVT extending into the popliteal vein. 2. No evidence of acute DVT in the right lower extremity. These results will be called to the ordering clinician or representative by the Radiologist Assistant, and communication documented in the PACS or zVision Dashboard. Electronically Signed   By: Jacqulynn Cadet M.D.   On: 08/19/2018 08:17   Dg Chest Portable 1 View  Result Date: 08/18/2018 CLINICAL DATA:  Dyspnea and tachycardia with syncope today. EXAM: PORTABLE CHEST 1 VIEW COMPARISON:  04/23/2016 chest CT FINDINGS: The patient is status post right mastectomy. Heart size is top-normal. Nonaneurysmal thoracic aorta is noted. No acute pulmonary consolidation, edema, effusion or pneumothorax. No aggressive osseous lesions. IMPRESSION: 1. No active disease. 2. Status post mastectomy. Electronically Signed   By: Ashley Royalty M.D.   On: 08/18/2018 16:18    ASSESSMENT: Massive bilateral PE status post thrombolytics, history of breast cancer.  PLAN:    1.  Pulmonary embolism: Patient also noted have left lower leg DVT.  Patient underwent thrombolytics with significant improvement of her symptoms.  She also has now had a IVC filter placement.  Patient has a history of PE approximately 10 years ago requiring extended anticoagulation.  Given the significant size of this current PE plus the fact that it is her second lifetime PE, I have recommended lifelong anticoagulation.  Can consider hypercoagulable work-up as an outpatient.  Patient currently on heparin drip.  I have recommended 48-hour Lovenox bridge at 1 mg/kg along with concurrent Eliquis.  Eliquis takes  approximately 48 hours to become therapeutic.  Okay to discharge from an oncology standpoint.  Patient will follow-up in the cancer center in 4 to 6 weeks for further evaluation. 2.  History of breast cancer: Please see clinic note dated June 29, 2018 for details of her oncology history.  Patient completed 5 years of letrozole in March 2012. 3.  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  No evidence of disease.  Patient's most recent CT scan at [facility] on Dec 10, 2017 did not reveal any evidence of recurrent or progressive disease. 4.  Wound care: Continue follow-up at [facility] as scheduled.  Appreciate consult, call with questions.   Lloyd Huger, MD   08/20/2018 9:23 PM

## 2018-08-20 NOTE — Discharge Instructions (Signed)
Vascular Surgery Discharge Instructions: 1) Remove right groin dressing.  2) Keep open to air 3) You may shower. Please keep groin clean and dry.

## 2018-08-20 NOTE — Progress Notes (Signed)
[place] [facility] [place] NAME: [name] [name]    MR#:  [id number]  DATE OF BIRTH:  [birth year]  SUBJECTIVE:  CHIEF COMPLAINT:   Chief Complaint  Patient presents with  . Near Syncope   No new complaint this morning.  No chest pain.  No shortness of breath.  Husband at bedside.  REVIEW OF SYSTEMS:  Review of Systems  Constitutional: Negative for chills, fever and weight loss.  HENT: Negative for hearing loss and tinnitus.   Eyes: Negative for blurred vision.  Respiratory: Negative for cough, hemoptysis and shortness of breath.   Cardiovascular: Negative for chest pain and palpitations.  Gastrointestinal: Negative for heartburn and nausea.  Genitourinary: Negative for dysuria and urgency.  Musculoskeletal: Negative for myalgias and neck pain.  Neurological: Negative for dizziness, tingling and headaches.  Psychiatric/Behavioral: Negative for depression and substance abuse.    DRUG ALLERGIES:  No Known Allergies VITALS:  Blood pressure (!) 143/88, pulse 98, temperature (!) 97.4 F (36.3 C), temperature source Oral, resp. rate 18, height 5\' 6"  (1.676 m), weight 96.1 kg, SpO2 99 %. PHYSICAL EXAMINATION:   Physical Exam  Constitutional: She is oriented to person, place, and time and well-developed, well-nourished, and in no distress.  HENT:  Head: Normocephalic and atraumatic.  Eyes: Pupils are equal, round, and reactive to light. Conjunctivae and EOM are normal.  Neck: Normal range of motion. Neck supple. No tracheal deviation present.  Cardiovascular: Normal rate, regular rhythm and normal heart sounds.  Pulmonary/Chest: Effort normal and breath sounds normal.  Abdominal: Soft. Bowel sounds are normal. She exhibits no distension.  Musculoskeletal: Normal range of motion.        General: No edema.  Neurological: She is alert and oriented to person, place, and time. No cranial nerve deficit.  Skin: Skin is warm. She is not diaphoretic.  No erythema.  Psychiatric: Affect and judgment normal.   LABORATORY PANEL:  Female CBC Recent Labs  Lab 08/20/18 0445  WBC 7.1  HGB 13.9  HCT 42.5  PLT 176   ------------------------------------------------------------------------------------------------------------------ Chemistries  Recent Labs  Lab 08/20/18 0445  NA 142  K 3.3*  CL 108  CO2 26  GLUCOSE 137*  BUN 11  CREATININE 0.96  CALCIUM 8.4*  MG 1.7   RADIOLOGY:  No results found. ASSESSMENT AND PLAN:   1.Massive pulmonary embolism with right heart strain Patient was tachycardic and significantly short of breath with minimal exertion on presentation.  Patient status post TPA with excellent clinical and hemodynamic response.  This is recurrent VTE.  Patient currently on heparin drip.  Venous Doppler ultrasound done also revealed acute DVT of the left lower extremity.  No DVT on the right.   Patient seen by vascular surgeon already.  Had IVC filter placement done to prevent recurrent DVT  Patient to follow-up with vascular surgeon as outpatient due to recent placement of IVC. 2D echocardiogram done revealed ejection fraction of 60 to 65%.  No regional wall motion abnormalities.  Right ventricle cavity was moderately dilated.  Wall thickness was normal.  Pulmonary pressure could not be accurately estimated. Given patient's history of breast cancer , oncology consult already placed for further recommendations regarding anticoagulation.  To decide between therapeutic dose of Lovenox versus Eliquis.  I discussed with oncologist today.  They will see patient in consultation with recommendations today.   Patient clinically and hemodynamically stable .  Already weaned off oxygen with oxygen saturation of 99% on room air.  2.  Elevated troponin Troponin peaked at 0.9.  Trending down to 0.83.  Currently asymptomatic.  Patient already on heparin drip. This could be due to heart strain and pulmonary emboli.  Continue to  monitor.  3.Hypothyroidism Continue levothyroxine.  4. Hyperlipidemia Continue simvastatin.  DVT prophylaxis; patient already on heparin drip   All the records are reviewed and case discussed with Care Management/Social Worker. Management plans discussed with the patient, family and they are in agreement.  CODE STATUS: Full Code  TOTAL TIME TAKING CARE OF THIS PATIENT: 35 minutes.   More than 50% of the time was spent in counseling/coordination of care: YES  POSSIBLE D/C IN 1 DAYS, DEPENDING ON CLINICAL CONDITION.   Trexton Escamilla M.D on 08/20/2018 at 1:51 PM  Between 7am to 6pm - Pager - [phone]  After 6pm go to [email or web address] - Proofreader  [facility]  Office  [phone]  CC: Primary care physician; Rusty Aus, MD  Note: This dictation was prepared with Dragon dictation along with smaller phrase technology. Any transcriptional errors that result from this process are unintentional.

## 2018-08-21 LAB — CBC
HCT: 46.5 % — ABNORMAL HIGH (ref 36.0–46.0)
Hemoglobin: 14.7 g/dL (ref 12.0–15.0)
MCH: 28.1 pg (ref 26.0–34.0)
MCHC: 31.6 g/dL (ref 30.0–36.0)
MCV: 88.9 fL (ref 80.0–100.0)
Platelets: 179 10*3/uL (ref 150–400)
RBC: 5.23 MIL/uL — ABNORMAL HIGH (ref 3.87–5.11)
RDW: 13.4 % (ref 11.5–15.5)
WBC: 6.8 10*3/uL (ref 4.0–10.5)
nRBC: 0 % (ref 0.0–0.2)

## 2018-08-21 LAB — HEPARIN LEVEL (UNFRACTIONATED): Heparin Unfractionated: 0.75 IU/mL — ABNORMAL HIGH (ref 0.30–0.70)

## 2018-08-21 MED ORDER — APIXABAN 5 MG PO TABS: 5.0000 mg | ORAL_TABLET | Freq: Two times a day (BID) | ORAL | 0 refills | Status: DC

## 2018-08-21 MED ORDER — APIXABAN 5 MG PO TABS: 10.0000 mg | ORAL_TABLET | Freq: Two times a day (BID) | ORAL | 0 refills | Status: DC

## 2018-08-21 MED ORDER — APIXABAN 5 MG PO TABS
10.0000 mg | ORAL_TABLET | Freq: Two times a day (BID) | ORAL | Status: DC
  Administered 2018-08-21: 10 mg via ORAL

## 2018-08-21 MED ORDER — APIXABAN 5 MG PO TABS: 5.0000 mg | ORAL_TABLET | Freq: Two times a day (BID) | ORAL | Status: DC

## 2018-08-21 NOTE — Progress Notes (Signed)
[place] for heparin drip Indication: pulmonary embolus  No Known Allergies  Patient Measurements: Height: 5\' 6"  (167.6 cm) Weight: 211 lb 13.8 oz (96.1 kg) IBW/kg (Calculated) : 59.3 Heparin Dosing Weight: 83 kg  Vital Signs: Temp: 99 F (37.2 C) (01/24 0422) Temp Source: Oral (01/24 0422) BP: 133/70 (01/24 0422) Pulse Rate: 92 (01/24 0422)  Labs: Recent Labs    08/18/18 1509 08/18/18 2036  08/19/18 0348  08/19/18 0827 08/19/18 1534 08/19/18 2244 08/20/18 0445 08/21/18 0345  HGB 14.8  --   --  14.1  --   --   --   --  13.9 14.7  HCT 45.3  --   --  44.3  --   --   --   --  42.5 46.5*  PLT 174  --   --  183  --   --   --   --  176 179  APTT 33 109*   < > 83*  --  66* 46*  --   --   --   LABPROT 13.4  --   --   --   --   --   --   --   --   --   INR 1.03  --   --   --   --   --   --   --   --   --   HEPARINUNFRC  --   --   --   --    < > 0.24* 0.15* 0.58 0.61 0.75*  CREATININE 1.04*  --   --  0.95  --   --   --   --  0.96  --   TROPONINI 0.12* 0.90*  --  0.83*  --   --   --   --   --   --    < > = values in this interval not displayed.    Estimated Creatinine Clearance: 64.6 mL/min (by C-G formula based on SCr of 0.96 mg/dL).   Medical History: Past Medical History:  Diagnosis Date  . Anxiety   . Cancer [facility])    2006 right side breast ca. 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 GERD (gastroesophageal reflux disease)   . Hypercholesteremia   . Peripheral neuropathic pain   . Personal history of chemotherapy    2006/2014  . Personal history of radiation therapy    2006/2014  . Pulmonary emboli ([place])   . Urinary incontinence     Medications:  Pharmacy consulted for heparin drip management for 68 yo female admitted with PE/DVT and received alteplase at 1805 on 1/22. Patient received IVC filter on 1/22. Patient receiving heparin at 1300 units/hr.   Assessment:  Goal of Therapy:  Heparin level goal 0.3-0.5 for first 24  hours, then 0.3-0.7. Monitor platelets by anticoagulation protocol: Yes   Plan:  Will increase heparin to 1450 units/hr and recheck heparin level at 2230.   1/22 2300 heparin level 0.58. Continue current regimen. Recheck heparin level and CBC with AM labs.  1/23 AM heparin level 0.61. Continue current regimen. Recheck heparin level and CBC with tomorrow AM labs.  1/24 AM heparin level 0.75. Decrease to 1350 units/hr and recheck in 6 hours.  Pharmacy will continue to monitor and adjust per consult.   [name] [name] S 08/21/2018,4:41 AM

## 2018-08-21 NOTE — Progress Notes (Signed)
Pt to be discharged today. Iv and tele removed. disch instructions prescription and elequis coupon given to pt and her husband. disch via w.c. to home.

## 2018-08-21 NOTE — Discharge Summary (Signed)
[facility] at [place] NAME: [name] [name]    MR#:  [id number]  DATE OF BIRTH:  [birth year]  DATE OF ADMISSION:  08/18/2018   ADMITTING PHYSICIAN: Vaughan Basta, MD  DATE OF DISCHARGE: 08/21/2018  PRIMARY CARE PHYSICIAN: Rusty Aus, MD   ADMISSION DIAGNOSIS:  Acute saddle pulmonary embolism without acute cor pulmonale (HCC) [I26.92] DISCHARGE DIAGNOSIS:  Principal Problem:   Pulmonary emboli (HCC) Active Problems:   Pulmonary embolism ([place])  SECONDARY DIAGNOSIS:   Past Medical History:  Diagnosis Date  . Anxiety   . Cancer [facility])    2006 right side breast ca. 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 GERD (gastroesophageal reflux disease)   . Hypercholesteremia   . Peripheral neuropathic pain   . Personal history of chemotherapy    2006/2014  . Personal history of radiation therapy    2006/2014  . Pulmonary emboli ([place])   . Urinary incontinence    HOSPITAL COURSE:       Chief complaint; near syncope  HISTORY OF PRESENT ILLNESS: [name] [name]  is a 68 y.o. female with a known history of anxiety, breast cancer status post mastectomy and radiation, gastroesophageal reflux disease, hypercholesterolemia, peripheral neuropathy, pulmonary embolism-she was on anticoagulation for many years and stopped 2 to 3 years ago. For last 1 to 2 days prior to admission she had significant shortness of breath on minimal exertion which is new for hers and she also has some pain in her chest so came to emergency room She was noted to have a saddle pulmonary embolism with significant right heart strain per CT scan angiogram so ER physician discussed with intensivist Dr. Alva Garnet.  He suggested to start on IV TPA and patient was admitted to ICU with plans to initiate heparin drip after TPA.  Please refer to the H&P dictated for further details.  HOSPITAL COURSE 1.Massive pulmonary embolism with right heart strain Patient was tachycardic and  significantly short of breath with minimal exertion on presentation.  Patient status post TPA with excellent clinical and hemodynamic response.  This is recurrent VTE.  Patient was subsequently placed on heparin drip for at least 48 hours during this admission.  Venous Doppler ultrasound done also revealed acute DVT of the left lower extremity.  No DVT on the right.   Patient seen by vascular surgeon  Had IVC filter placement done to prevent recurrent DVT .Patient to follow-up with vascular surgeon as outpatient due to recent placement of IVC.2D echocardiogram done revealed ejection fraction of 60 to 65%.  No regional wall motion abnormalities.  Right ventricle cavity was moderately dilated.  Wall thickness was normal.  Pulmonary pressure could not be accurately estimated. Given patient's history of breast cancer , I consulted patient's oncologist Dr. Grayland Ormond who saw patient in consultation for recommendation on anticoagulation regarding therapeutic dose of Lovenox versus Eliquis.  Patient's oncologist recommended bridging with therapeutic dose of Lovenox for 48 hours concurrently with Eliquis.  I discussed recommendations with him due to concern for increased risk of bleeding with this regimen and he recommended the same. I got a second opinion from 2 different oncologist at [facility] consult services today with Dr. Gregary Signs sheets as well as Dr. Diannia Ruder.  Both oncologist at [facility] strongly recommended against placing patient on therapeutic dose of Lovenox in addition to Eliquis.  Confirmed that Eliquis has rapid onset of action and does not require bridging with Lovenox.  I discussed both recommendations with patient  and husband and they decided to proceed with treating with Eliquis regimen for treatment for PE with 10 mg p.o. twice daily for the first 7 days and then followed by 5 mg p.o. twice daily.  Patient remains hemodynamically stable.  Currently on room air.  Patient to follow-up with oncology service as  outpatient.  2. Elevated troponin.  Secondary to PE Troponin peaked at 0.9.  Trending down to 0.83.  Patient was treated with heparin drip as mentioned above.  Remains asymptomatic.  3.Hypothyroidism Continue levothyroxine.  4. Hyperlipidemia Continue simvastatin.  5.  History of breast cancer.  Previously treated.  To follow-up with her oncologist in 4 weeks.  DISCHARGE CONDITIONS:  Stable CONSULTS OBTAINED:   DRUG ALLERGIES:  No Known Allergies DISCHARGE MEDICATIONS:   Allergies as of 08/21/2018   No Known Allergies     Medication List    STOP taking these medications   metroNIDAZOLE 500 MG tablet Commonly known as:  FLAGYL     TAKE these medications   acetaminophen 325 MG tablet Commonly known as:  TYLENOL Take 325 mg by mouth every 4 (four) hours as needed.   apixaban 5 MG Tabs tablet Commonly known as:  ELIQUIS Take 2 tablets (10 mg total) by mouth 2 (two) times daily.   apixaban 5 MG Tabs tablet Commonly known as:  ELIQUIS Take 1 tablet (5 mg total) by mouth 2 (two) times daily. Start taking on:  August 28, 2018   B-12 DOTS 500 MCG Tbdp Generic drug:  Cyanocobalamin Take by mouth every morning.   cetirizine 10 MG tablet Commonly known as:  ZYRTEC Take 10 mg by mouth daily.   D-5000 125 MCG (5000 UT) Tabs Generic drug:  Cholecalciferol Take by mouth every morning.   fluticasone 50 MCG/ACT nasal spray Commonly known as:  FLONASE Place 2 sprays into both nostrils daily.   gabapentin 300 MG capsule Commonly known as:  NEURONTIN Take 900 mg by mouth at bedtime.   HYDROcodone-acetaminophen 5-325 MG tablet Commonly known as:  NORCO/VICODIN 1 tablet every 6 (six) hours as needed.   levothyroxine 125 MCG tablet Commonly known as:  SYNTHROID, LEVOTHROID Take 125 mcg by mouth.   LORazepam 1 MG tablet Commonly known as:  ATIVAN Take 1 mg by mouth at bedtime.   omeprazole 20 MG capsule Commonly known as:  PRILOSEC Take 20 mg by mouth  daily.   simvastatin 20 MG tablet Commonly known as:  ZOCOR Take 20 mg by mouth daily at 6 PM.   temazepam 15 MG capsule Commonly known as:  RESTORIL Take 15 mg by mouth at bedtime as needed.   temazepam 15 MG capsule Commonly known as:  RESTORIL Take 15 mg by mouth at bedtime as needed for sleep.   traMADol 50 MG tablet Commonly known as:  ULTRAM Take by mouth every 6 (six) hours as needed.        DISCHARGE INSTRUCTIONS:   DIET:  Cardiac diet DISCHARGE CONDITION:  Stable ACTIVITY:  Activity as tolerated OXYGEN:  Home Oxygen: No.  Oxygen Delivery: room air DISCHARGE LOCATION:  home   If you experience worsening of your admission symptoms, develop shortness of breath, life threatening emergency, suicidal or homicidal thoughts you must seek medical attention immediately by calling 911 or calling your MD immediately  if symptoms less severe.  You Must read complete instructions/literature along with all the possible adverse reactions/side effects for all the Medicines you take and that have been prescribed to you. Take any new Medicines  after you have completely understood and accpet all the possible adverse reactions/side effects.   Please note  You were cared for by a hospitalist during your hospital stay. If you have any questions about your discharge medications or the care you received while you were in the hospital after you are discharged, you can call the unit and asked to speak with the hospitalist on call if the hospitalist that took care of you is not available. Once you are discharged, your primary care physician will handle any further medical issues. Please note that NO REFILLS for any discharge medications will be authorized once you are discharged, as it is imperative that you return to your primary care physician (or establish a relationship with a primary care physician if you do not have one) for your aftercare needs so that they can reassess your need for  medications and monitor your lab values.    On the day of Discharge:  VITAL SIGNS:  Blood pressure (!) 144/87, pulse 84, temperature 98 F (36.7 C), temperature source Oral, resp. rate (!) 22, height 5\' 6"  (1.676 m), weight 96.1 kg, SpO2 99 %. PHYSICAL EXAMINATION:  GENERAL:  68 y.o.-year-old patient lying in the bed with no acute distress.  EYES: Pupils equal, round, reactive to light and accommodation. No scleral icterus. Extraocular muscles intact.  HEENT: Head atraumatic, normocephalic. Oropharynx and nasopharynx clear.  NECK:  Supple, no jugular venous distention. No thyroid enlargement, no tenderness.  LUNGS: Normal breath sounds bilaterally, no wheezing, rales,rhonchi or crepitation. No use of accessory muscles of respiration.  CARDIOVASCULAR: S1, S2 normal. No murmurs, rubs, or gallops.  ABDOMEN: Soft, non-tender, non-distended. Bowel sounds present. No organomegaly or mass.  EXTREMITIES: No pedal edema, cyanosis, or clubbing.  NEUROLOGIC: Cranial nerves II through XII are intact. Muscle strength 5/5 in all extremities. Sensation intact. Gait not checked.  PSYCHIATRIC: The patient is alert and oriented x 3.  SKIN: No obvious rash, lesion, or ulcer.  DATA REVIEW:   CBC Recent Labs  Lab 08/21/18 0345  WBC 6.8  HGB 14.7  HCT 46.5*  PLT 179    Chemistries  Recent Labs  Lab 08/20/18 0445  NA 142  K 3.3*  CL 108  CO2 26  GLUCOSE 137*  BUN 11  CREATININE 0.96  CALCIUM 8.4*  MG 1.7     Microbiology Results  Results for orders placed or performed during the hospital encounter of 08/18/18  MRSA PCR Screening     Status: None   Collection Time: 08/18/18  8:22 PM  Result Value Ref Range Status   MRSA by PCR NEGATIVE NEGATIVE Final    Comment:        The GeneXpert MRSA Assay (FDA approved for NASAL specimens only), is one component of a comprehensive MRSA colonization surveillance program. It is not intended to diagnose MRSA infection nor to guide or monitor  treatment for MRSA infections. Performed at [facility], [address] [place], [place] [address]     RADIOLOGY:  No results found.   Management plans discussed with the patient, family and they are in agreement.  CODE STATUS: Full Code   TOTAL TIME TAKING CARE OF THIS PATIENT: 37 minutes.    Hershal Eriksson M.D on 08/21/2018 at 1:22 PM  Between 7am to 6pm - Pager - [phone]  After 6pm go to [email or web address] - Proofreader  [facility] [place]  Office  [phone]  CC: Primary care physician; Rusty Aus, MD   Note: This dictation  was prepared with Dragon dictation along with smaller phrase technology. Any transcriptional errors that result from this process are unintentional.

## 2018-08-21 NOTE — Plan of Care (Signed)
  Problem: Health Behavior/Discharge Planning: Goal: Ability to manage health-related needs will improve Outcome: Progressing   Problem: Safety: Goal: Ability to remain free from injury will improve Outcome: Progressing   

## 2018-08-21 NOTE — Care Management Important Message (Signed)
Copy of signed Medicare IM left with patient in room. 

## 2018-08-21 NOTE — Care Management Note (Signed)
Case Management Note  Patient Details  Name: [name] [name] MRN: [id number] Date of Birth: [birth year]  Subjective/Objective:  Patient is independent from home with husband.  Admitted with PE.  Discharging on Eliquis.   Action/Plan:  Called prescription to [facility].  The co-pay will be approximately $47/mo.  [facility] does not take the 30 day free coupon.  Told patients husband to take coupon cared to [facility] as they honor these Eliquis coupons.  No further needs identified at this time.                      Expected Discharge Date:                  Expected Discharge Plan:  Home/Self Care  In-House Referral:     Discharge planning Services  CM Consult  Post Acute Care Choice:    Choice offered to:     DME Arranged:    DME Agency:     HH Arranged:    [place] Agency:     Status of Service:  Completed, signed off  If discussed at [employer] of Stay Meetings, dates discussed:    Additional Comments:  Elza Rafter, RN 08/21/2018, 12:02 PM

## 2018-08-24 ENCOUNTER — Encounter: Payer: Self-pay | Admitting: Vascular Surgery

## 2018-09-03 ENCOUNTER — Other Ambulatory Visit (INDEPENDENT_AMBULATORY_CARE_PROVIDER_SITE_OTHER): Payer: Self-pay | Admitting: Nurse Practitioner

## 2018-09-03 DIAGNOSIS — Z86718 Personal history of other venous thrombosis and embolism: Secondary | ICD-10-CM

## 2018-09-04 ENCOUNTER — Ambulatory Visit (INDEPENDENT_AMBULATORY_CARE_PROVIDER_SITE_OTHER): Payer: Medicare Other | Admitting: Nurse Practitioner

## 2018-09-04 ENCOUNTER — Ambulatory Visit (INDEPENDENT_AMBULATORY_CARE_PROVIDER_SITE_OTHER): Payer: Medicare Other

## 2018-09-04 ENCOUNTER — Encounter (INDEPENDENT_AMBULATORY_CARE_PROVIDER_SITE_OTHER): Payer: Self-pay | Admitting: Nurse Practitioner

## 2018-09-04 VITALS — BP 145/73 | HR 94 | Resp 14 | Ht 66.0 in | Wt 209.0 lb

## 2018-09-04 DIAGNOSIS — Z86718 Personal history of other venous thrombosis and embolism: Secondary | ICD-10-CM

## 2018-09-04 DIAGNOSIS — E7800 Pure hypercholesterolemia, unspecified: Secondary | ICD-10-CM | POA: Diagnosis not present

## 2018-09-04 DIAGNOSIS — I824Y2 Acute embolism and thrombosis of unspecified deep veins of left proximal lower extremity: Secondary | ICD-10-CM | POA: Diagnosis not present

## 2018-09-04 DIAGNOSIS — I2602 Saddle embolus of pulmonary artery with acute cor pulmonale: Secondary | ICD-10-CM | POA: Diagnosis not present

## 2018-09-20 ENCOUNTER — Encounter (INDEPENDENT_AMBULATORY_CARE_PROVIDER_SITE_OTHER): Payer: Self-pay | Admitting: Nurse Practitioner

## 2018-09-20 DIAGNOSIS — I82409 Acute embolism and thrombosis of unspecified deep veins of unspecified lower extremity: Secondary | ICD-10-CM | POA: Insufficient documentation

## 2018-09-20 NOTE — Progress Notes (Signed)
SUBJECTIVE:  Patient ID: [name] [name], female    DOB: [birth year], 68 y.o.   MRN: [id number] Chief Complaint  Patient presents with  . Follow-up    2 WK [facility]    HPI  [name] [name] is a 68 y.o. female that was subsequently found to have a saddle PE resulting from a DVT.  This is her second PE.  She subsequently underwent thrombectomy.  She feels much better at this time however still has some shortness of breath with exertion.  She denies any fever, chills, nausea, vomiting or diarrhea.  She denies any TIA-like symptoms or amaurosis fugax.  She denies any chest pain or shortness of breath at rest.  Today she presents for a lower extremity DVT study which reveals that her DVT is much improved from previous examination.  The peroneal vein is not well visualized.  No evidence of superficial venous thrombosis.  Partial compressibility of the tibial veins  Past Medical History:  Diagnosis Date  . Anxiety   . Cancer [facility])    2006 right side breast ca. 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 GERD (gastroesophageal reflux disease)   . Hypercholesteremia   . Peripheral neuropathic pain   . Personal history of chemotherapy    2006/2014  . Personal history of radiation therapy    2006/2014  . Pulmonary emboli ([place])   . Urinary incontinence     Past Surgical History:  Procedure Laterality Date  . APPENDECTOMY    . CARDIAC CATHETERIZATION Left 05/23/2016   Procedure: Left Heart Cath and Coronary Angiography;  Surgeon: Isaias Cowman, MD;  Location: [facility];  Service: Cardiovascular;  Laterality: Left;  . IVC FILTER INSERTION N/A 08/19/2018   Procedure: IVC FILTER INSERTION;  Surgeon: Algernon Huxley, MD;  Location: [facility];  Service: Cardiovascular;  Laterality: N/A;  . MASTECTOMY Right 2006/2014  . rt breast removal     cancer    Social History   Socioeconomic History  . Marital status: Married    Spouse name: Not on file  . Number of children: Not on  file  . Years of education: Not on file  . Highest education level: Not on file  Occupational History  . Not on file  Social Needs  . Financial resource strain: Not on file  . Food insecurity:    Worry: Not on file    Inability: Not on file  . Transportation needs:    Medical: Not on file    Non-medical: Not on file  Tobacco Use  . Smoking status: Never Smoker  . Smokeless tobacco: Never Used  Substance and Sexual Activity  . Alcohol use: Never    Frequency: Never  . Drug use: No  . Sexual activity: Not on file  Lifestyle  . Physical activity:    Days per week: Not on file    Minutes per session: Not on file  . Stress: Not on file  Relationships  . Social connections:    Talks on phone: Not on file    Gets together: Not on file    Attends religious service: Not on file    Active member of club or organization: Not on file    Attends meetings of clubs or organizations: Not on file    Relationship status: Not on file  . Intimate partner violence:    Fear of current or ex partner: Not on file    Emotionally abused: Not on file  Physically abused: Not on file    Forced sexual activity: Not on file  Other Topics Concern  . Not on file  Social History Narrative  . Not on file    Family History  Problem Relation Age of Onset  . Asthma Mother   . COPD Mother   . CVA Father   . Heart attack Father   . Coronary artery disease Father   . Schizophrenia Brother   . Peripheral vascular disease Brother   . Breast cancer Neg Hx     No Known Allergies   Review of Systems   Review of Systems: Negative Unless Checked Constitutional: [] Weight loss  [] Fever  [] Chills Cardiac: [] Chest pain   []  Atrial Fibrillation  [] Palpitations   [] Shortness of breath when laying flat   [x] Shortness of breath with exertion. [] Shortness of breath at rest Vascular:  [] Pain in legs with walking   [] Pain in legs with standing [] Pain in legs when laying flat   [] Claudication    [] Pain in  feet when laying flat    [x] History of DVT   [] Phlebitis   [x] Swelling in legs   [] Varicose veins   [] Non-healing ulcers Pulmonary:   [] Uses home oxygen   [] Productive cough   [] Hemoptysis   [] Wheeze  [] COPD   [] Asthma Neurologic:  [] Dizziness   [] Seizures  [] Blackouts [] History of stroke   [] History of TIA  [] Aphasia   [] Temporary Blindness   [] Weakness or numbness in arm   [] Weakness or numbness in leg Musculoskeletal:   [] Joint swelling   [] Joint pain   [] Low back pain  []  History of Knee Replacement [] Arthritis [] back Surgeries  []  Spinal Stenosis    Hematologic:  [] Easy bruising  [] Easy bleeding   [] Hypercoagulable state   [] Anemic Gastrointestinal:  [] Diarrhea   [] Vomiting  [] Gastroesophageal reflux/heartburn   [] Difficulty swallowing. [] Abdominal pain Genitourinary:  [] Chronic kidney disease   [] Difficult urination  [] Anuric   [] Blood in urine [] Frequent urination  [] Burning with urination   [] Hematuria Skin:  [] Rashes   [] Ulcers [] Wounds Psychological:  [] History of anxiety   []  History of major depression  []  Memory Difficulties      OBJECTIVE:   Physical Exam  BP (!) 145/73 (BP Location: Left Arm, Patient Position: Sitting)   Pulse 94   Resp 14   Ht 5\' 6"  (1.676 m)   Wt 209 lb (94.8 kg)   BMI 33.73 kg/m   Gen: WD/WN, NAD Head: [place]/AT, No temporalis wasting.  Ear/Nose/Throat: Hearing grossly intact, nares w/o erythema or drainage Eyes: PER, EOMI, sclera nonicteric.  Neck: Supple, no masses.  No JVD.  Pulmonary:  Good air movement, no use of accessory muscles.  Cardiac: RRR Vascular:  Vessel Right Left  Radial Palpable Palpable  Dorsalis Pedis Palpable Palpable  Posterior Tibial Palpable Palpable   Gastrointestinal: soft, non-distended. No guarding/no peritoneal signs.  Musculoskeletal: M/S 5/5 throughout.  No deformity or atrophy.  Neurologic: Pain and light touch intact in extremities.  Symmetrical.  Speech is fluent. Motor exam as listed above. Psychiatric: Judgment  intact, Mood & affect appropriate for pt's clinical situation. Dermatologic: No Venous rashes. No Ulcers Noted.  No changes consistent with cellulitis. Lymph : No Cervical lymphadenopathy, no lichenification or skin changes of chronic lymphedema.       ASSESSMENT AND PLAN:  1. Acute deep vein thrombosis (DVT) of proximal vein of left lower extremity ([place]) Patient has little evidence of DVT left.  We will repeat an ultrasound in 2 months in order to ensure  that the DVT is completely resolved prior to planning for removal of her IVC filter.  This is more so for the patient's peace of mind as she is apprehensive to have it removed prior to the DVT being completely resolved.  2. Pure hypercholesterolemia Continue statin as ordered and reviewed, no changes at this time   3. Acute saddle pulmonary embolism with acute cor pulmonale [facility]) Patient states that evidence of her pulmonary embolism has resolved at this time.  She will continue to follow-up with pulmonology, primary care, and cardiology for management.   Current Outpatient Medications on File Prior to Visit  Medication Sig Dispense Refill  . acetaminophen (TYLENOL) 325 MG tablet Take 325 mg by mouth every 4 (four) hours as needed.    Marland Kitchen apixaban (ELIQUIS) 5 MG TABS tablet Take 2 tablets (10 mg total) by mouth 2 (two) times daily. 26 tablet 0  . Cholecalciferol (D-5000) 5000 units TABS Take by mouth every morning.     . Cyanocobalamin (B-12 DOTS) 500 MCG TBDP Take by mouth every morning.     . fluticasone (FLONASE) 50 MCG/ACT nasal spray Place 2 sprays into both nostrils daily.    Marland Kitchen gabapentin (NEURONTIN) 300 MG capsule Take 900 mg by mouth at bedtime.    Marland Kitchen levothyroxine (SYNTHROID, LEVOTHROID) 125 MCG tablet Take 125 mcg by mouth.     Marland Kitchen LORazepam (ATIVAN) 1 MG tablet Take 1 mg by mouth at bedtime.    Marland Kitchen omeprazole (PRILOSEC) 20 MG capsule Take 20 mg by mouth daily.    . simvastatin (ZOCOR) 20 MG tablet Take 20 mg by mouth daily at 6 PM.     . temazepam (RESTORIL) 15 MG capsule Take 15 mg by mouth at bedtime as needed for sleep.    . traMADol (ULTRAM) 50 MG tablet Take by mouth every 6 (six) hours as needed.     No current facility-administered medications on file prior to visit.     There are no Patient Instructions on file for this visit. No follow-ups on file.   Kris Hartmann, NP  This note was completed with Sales executive.  Any errors are purely unintentional.

## 2018-09-20 NOTE — Progress Notes (Signed)
[place]  [phone] Fax:(336) [phone]  ID: [name] [name] OB: 02-04-1951  MR#: [id number]  IRW#:[id number]  Patient Care Team: Rusty Aus, MD as PCP - General (Internal Medicine)  CHIEF COMPLAINT: h/o angiosarcoma of right breast, bilateral PE requiring thrombolytics.  INTERVAL HISTORY: Patient returns to clinic today as an add-on for hospital follow-up after being admitted with bilateral massive PE and left leg DVT requiring thrombolytics.  She is now on Eliquis and is tolerating this well.  She currently feels well and is back to her baseline. She continues to actively see wound care at [facility] for her right chest wall.  She otherwise feels well and is asymptomatic.  She has no neurologic complaints.  She denies any pain.  She has a good appetite and denies weight loss.  She does not complain of weakness or fatigue.  She denies any further chest pain or shortness of breath.  She denies any nausea, vomiting, constipation, or diarrhea.  She has no urinary complaints.  Patient offers no further specific complaints today.  REVIEW OF SYSTEMS:   Review of Systems  Constitutional: Negative.  Negative for fever, malaise/fatigue and weight loss.  Respiratory: Negative.  Negative for cough and shortness of breath.   Cardiovascular: Negative.  Negative for chest pain and leg swelling.  Gastrointestinal: Negative.  Negative for abdominal pain and constipation.  Genitourinary: Negative.  Negative for dysuria.  Musculoskeletal: Negative.  Negative for back pain.  Skin: Negative.  Negative for rash.  Neurological: Negative.  Negative for sensory change, focal weakness, weakness and headaches.  Psychiatric/Behavioral: Negative.  The patient is not nervous/anxious.     As per HPI. Otherwise, a complete review of systems is negative.  PAST MEDICAL HISTORY: Past Medical History:  Diagnosis Date  . Anxiety   . Cancer [facility])    2006 right side breast ca.  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 GERD (gastroesophageal reflux disease)   . Hypercholesteremia   . Peripheral neuropathic pain   . Personal history of chemotherapy    2006/2014  . Personal history of radiation therapy    2006/2014  . Pulmonary emboli ([place])   . Urinary incontinence     PAST SURGICAL HISTORY: Right mastectomy  FAMILY HISTORY: Family History  Problem Relation Age of Onset  . Asthma Mother   . COPD Mother   . CVA Father   . Heart attack Father   . Coronary artery disease Father   . Schizophrenia Brother   . Peripheral vascular disease Brother   . Breast cancer Neg Hx     ADVANCED DIRECTIVES (Y/N):  N  HEALTH MAINTENANCE: Social History   Tobacco Use  . Smoking status: Never Smoker  . Smokeless tobacco: Never Used  Substance Use Topics  . Alcohol use: Never    Frequency: Never  . Drug use: No     Colonoscopy:  PAP:  Bone density:  Lipid panel:  No Known Allergies  Current Outpatient Medications  Medication Sig Dispense Refill  . acetaminophen (TYLENOL) 325 MG tablet Take 325 mg by mouth every 4 (four) hours as needed.    Marland Kitchen apixaban (ELIQUIS) 5 MG TABS tablet Take 2 tablets (10 mg total) by mouth 2 (two) times daily. 26 tablet 0  . Cholecalciferol (D-5000) 5000 units TABS Take by mouth every morning.     . Cyanocobalamin (B-12 DOTS) 500 MCG TBDP Take by mouth every morning.     . fluticasone (FLONASE) 50 MCG/ACT nasal spray  Place 2 sprays into both nostrils daily.    Marland Kitchen gabapentin (NEURONTIN) 300 MG capsule Take 900 mg by mouth at bedtime.    Marland Kitchen levothyroxine (SYNTHROID, LEVOTHROID) 125 MCG tablet Take 125 mcg by mouth.     Marland Kitchen LORazepam (ATIVAN) 1 MG tablet Take 1 mg by mouth at bedtime.    Marland Kitchen omeprazole (PRILOSEC) 20 MG capsule Take 20 mg by mouth daily.    . simvastatin (ZOCOR) 20 MG tablet Take 20 mg by mouth daily at 6 PM.    . temazepam (RESTORIL) 15 MG capsule Take 15 mg by mouth at bedtime as needed for sleep.    . traMADol (ULTRAM) 50  MG tablet Take by mouth every 6 (six) hours as needed.     No current facility-administered medications for this visit.     OBJECTIVE: Vitals:   09/24/18 0958  BP: 137/79  Pulse: 91  Temp: 98 F (36.7 C)     Body mass index is 33.11 kg/m.    ECOG FS:0 - Asymptomatic  General: Well-developed, well-nourished, no acute distress. Eyes: Pink conjunctiva, anicteric sclera. HEENT: Normocephalic, moist mucous membranes. Breast: Right mastectomy. Lungs: Clear to auscultation bilaterally. Heart: Regular rate and rhythm. No rubs, murmurs, or gallops. Abdomen: Soft, nontender, nondistended. No organomegaly noted, normoactive bowel sounds. Musculoskeletal: No edema, cyanosis, or clubbing. Neuro: Alert, answering all questions appropriately. Cranial nerves grossly intact. Skin: No rashes or petechiae noted. Psych: Normal affect.  LAB RESULTS:  Lab Results  Component Value Date   NA 142 08/20/2018   K 3.3 (L) 08/20/2018   CL 108 08/20/2018   CO2 26 08/20/2018   GLUCOSE 137 (H) 08/20/2018   BUN 11 08/20/2018   CREATININE 0.96 08/20/2018   CALCIUM 8.4 (L) 08/20/2018   GFRNONAA >60 08/20/2018   GFRAA >60 08/20/2018    Lab Results  Component Value Date   WBC 6.8 08/21/2018   HGB 14.7 08/21/2018   HCT 46.5 (H) 08/21/2018   MCV 88.9 08/21/2018   PLT 179 08/21/2018     STUDIES: Vas [place] Lower Extremity Venous (dvt)  Result Date: 09/08/2018  Lower Venous Study Performing Technologist: Charlane Ferretti RT (R)(VS)  Examination Guidelines: A complete evaluation includes B-mode imaging, spectral Doppler, color Doppler, and power Doppler as needed of all accessible portions of each vessel. Bilateral testing is considered an integral part of a complete examination. Limited examinations for reoccurring indications may be performed as noted.  Left Venous Findings: +---------+---------------+---------+-----------+----------+-------+           CompressibilityPhasicitySpontaneityPropertiesSummary +---------+---------------+---------+-----------+----------+-------+ CFV      Full                                                 +---------+---------------+---------+-----------+----------+-------+ SFJ      Full                                                 +---------+---------------+---------+-----------+----------+-------+ FV Prox  Full                                                 +---------+---------------+---------+-----------+----------+-------+ FV Mid  Full                                                 +---------+---------------+---------+-----------+----------+-------+ FV DistalFull                                                 +---------+---------------+---------+-----------+----------+-------+ PFV      Full                                                 +---------+---------------+---------+-----------+----------+-------+ POP      Full                                                 +---------+---------------+---------+-----------+----------+-------+ PTV      Partial                                              +---------+---------------+---------+-----------+----------+-------+ PERO     Full                                                 +---------+---------------+---------+-----------+----------+-------+ Gastroc  Full                                                 +---------+---------------+---------+-----------+----------+-------+ GSV      Full                                                 +---------+---------------+---------+-----------+----------+-------+ SSV      Full                                                 +---------+---------------+---------+-----------+----------+-------+  Summary: Left: Findings appear improved from previous examination. There is no evidence of deep vein thrombosis in the lower extremity.There is no evidence of  superficial venous thrombosis. Left popliteal and peroneal veins appear patent. Peroneal veins not visualized well in grayscale.  *See table(s) above for measurements and observations. Electronically signed by Leotis Pain MD on 09/08/2018 at 2:18:46 PM.    Final     ONCOLOGY HISTORY: Patient was initially diagnosed with ER/PR positive HER-2 negative right breast cancer.  She underwent lumpectomy on January 31, 2005 which revealed a 5.5 cm tumor.  Sentinel lymph node biopsy noted a single cluster of tumor cells which was thought to be  lymphovascular invasion and not distinct nodal involvement.  She was noted to have a positive margin focally at the skin.  She underwent 4 cycles of adjuvant Adriamycin and Cytoxan followed by 6 weeks of weekly Taxol which was discontinued secondary to DVT and saddle pulmonary embolus.  She then underwent XRT which was completed in March 2007.  She initiated letrozole and completed a total of 5 years in March 2012.  She developed skin nodules and a right breast mass in August 2014 which was initially thought to be recurrent disease.  She initiated neoadjuvant treatment with carboplatinum and eribulin in September 2014 and completed 5 cycles which was complicated by neuropathy and thrombocytopenia.  She underwent a right mastectomy on September 20, 2013.  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  On October 11, 2013, she appeared to have multifocal recurrence in her skin and subsequently underwent concurrent XRT with Taxol.  She completed her XRT on December 27, 2013 and completed Taxol on February 23, 2014.  Patient had CT scan of chest, abdomen, pelvis on April 07, 2014 that did not reveal any evidence of metastatic or progressive disease.  ASSESSMENT: h/o angiosarcoma of right breast  PLAN:    1. h/o angiosarcoma of right breast: No evidence of disease.  Patient's most recent CT scan at [facility] on Dec 10, 2017 did not reveal any evidence of recurrent  or progressive disease.  She is noted to have small pulmonary nodules of 3 mm in size, but these are stable and unchanged.  No intervention is needed at this time.  Patient has been instructed to keep her previously scheduled follow-up appointment in June 2020. 2.  History of right breast cancer: Patient completed 5 years of letrozole in March 2012.  No intervention is needed. Patient's most recent mammogram on January 05, 2018 was reported as BI-RADS 1.  Repeat in June 2020.   3.  Pulmonary embolism/left lower leg DVT: Patient required thrombolytics as well as IVC filter replacement.  This is her second lifetime PE and after lengthy discussion, it was agreed upon the patient should remain on lifelong anticoagulation.  Because of this, no hypercoagulable work-up was necessary.  She has been instructed to keep her follow-up with vascular surgery for IVC filter removal.  Return to clinic as above.  4.  Wound care: Continue wound care clinic at [facility].  Patient expressed understanding and was in agreement with this plan. She also understands that She can call clinic at any time with any questions, concerns, or complaints.   Lloyd Huger, MD   09/25/2018 10:28 PM

## 2018-09-24 ENCOUNTER — Inpatient Hospital Stay: Payer: Medicare Other | Attending: Oncology | Admitting: Oncology

## 2018-09-24 ENCOUNTER — Other Ambulatory Visit: Payer: Self-pay

## 2018-09-24 VITALS — BP 137/79 | HR 91 | Temp 98.0°F | Ht 67.0 in | Wt 211.4 lb

## 2018-09-24 DIAGNOSIS — Z9011 Acquired absence of right breast and nipple: Secondary | ICD-10-CM

## 2018-09-24 DIAGNOSIS — Z170 Estrogen receptor positive status [ER+]: Secondary | ICD-10-CM | POA: Diagnosis not present

## 2018-09-24 DIAGNOSIS — Z853 Personal history of malignant neoplasm of breast: Secondary | ICD-10-CM

## 2018-09-24 DIAGNOSIS — I2699 Other pulmonary embolism without acute cor pulmonale: Secondary | ICD-10-CM | POA: Diagnosis not present

## 2018-09-24 DIAGNOSIS — Z79899 Other long term (current) drug therapy: Secondary | ICD-10-CM | POA: Insufficient documentation

## 2018-09-24 DIAGNOSIS — R918 Other nonspecific abnormal finding of lung field: Secondary | ICD-10-CM

## 2018-09-24 DIAGNOSIS — Z7901 Long term (current) use of anticoagulants: Secondary | ICD-10-CM | POA: Insufficient documentation

## 2018-09-24 DIAGNOSIS — I82402 Acute embolism and thrombosis of unspecified deep veins of left lower extremity: Secondary | ICD-10-CM | POA: Insufficient documentation

## 2018-09-24 DIAGNOSIS — Z9221 Personal history of antineoplastic chemotherapy: Secondary | ICD-10-CM | POA: Diagnosis not present

## 2018-09-24 DIAGNOSIS — Z923 Personal history of irradiation: Secondary | ICD-10-CM | POA: Diagnosis not present

## 2018-09-24 DIAGNOSIS — I824Y2 Acute embolism and thrombosis of unspecified deep veins of left proximal lower extremity: Secondary | ICD-10-CM

## 2018-09-24 DIAGNOSIS — I2602 Saddle embolus of pulmonary artery with acute cor pulmonale: Secondary | ICD-10-CM

## 2018-09-24 DIAGNOSIS — C50911 Malignant neoplasm of unspecified site of right female breast: Secondary | ICD-10-CM

## 2018-09-24 NOTE — Progress Notes (Signed)
Patient is here today to follow up on angiosarcoma of right female breast. Patient stated that she had been feeling weak. Patient stated that she is not able to sleep at night. Patient stated that she had been doing well.

## 2018-11-02 ENCOUNTER — Other Ambulatory Visit (INDEPENDENT_AMBULATORY_CARE_PROVIDER_SITE_OTHER): Payer: Self-pay | Admitting: Nurse Practitioner

## 2018-11-02 DIAGNOSIS — Z86718 Personal history of other venous thrombosis and embolism: Secondary | ICD-10-CM

## 2018-11-04 ENCOUNTER — Encounter (INDEPENDENT_AMBULATORY_CARE_PROVIDER_SITE_OTHER): Payer: Medicare Other

## 2018-11-04 ENCOUNTER — Ambulatory Visit (INDEPENDENT_AMBULATORY_CARE_PROVIDER_SITE_OTHER): Payer: Medicare Other | Admitting: Nurse Practitioner

## 2019-01-08 ENCOUNTER — Ambulatory Visit: Payer: Medicare Other | Admitting: Oncology

## 2019-01-14 ENCOUNTER — Ambulatory Visit: Payer: Medicare Other | Admitting: Oncology

## 2019-05-16 ENCOUNTER — Encounter: Payer: Self-pay | Admitting: Emergency Medicine

## 2019-05-16 ENCOUNTER — Emergency Department
Admission: EM | Admit: 2019-05-16 | Discharge: 2019-05-16 | Disposition: A | Discharge status: Home / Self Care | Payer: Medicare Other | Attending: Emergency Medicine | Admitting: Emergency Medicine

## 2019-05-16 ENCOUNTER — Other Ambulatory Visit: Payer: Self-pay

## 2019-05-16 DIAGNOSIS — R0602 Shortness of breath: Secondary | ICD-10-CM | POA: Insufficient documentation

## 2019-05-16 DIAGNOSIS — Z5321 Procedure and treatment not carried out due to patient leaving prior to being seen by health care provider: Secondary | ICD-10-CM | POA: Diagnosis not present

## 2019-05-16 MED ORDER — ALBUTEROL SULFATE (2.5 MG/3ML) 0.083% IN NEBU: 5.0000 mg | INHALATION_SOLUTION | Freq: Once | RESPIRATORY_TRACT | Status: DC

## 2019-05-16 NOTE — ED Triage Notes (Signed)
Pt reports feels like she cannot breathe.

## 2019-05-16 NOTE — ED Notes (Signed)
Called pt , spoke with her husband states they were at home, informed the husband that if she wanted to be treated to return to the ED

## 2019-05-16 NOTE — ED Triage Notes (Signed)
Discussed with first RN to check on patient and see how she was feeling and if she would allow protocols.

## 2019-05-16 NOTE — ED Triage Notes (Signed)
Pt encouraged to have EKG and blood work completed. Pt upset that husband cannot stay with her in the [place]. This RN explained to patient that once triage and protocols were completed we could let her wait with her husband outside and provide her with a warm blanket. Pt is refusing to let RN or EDT complete any protocols or lab work. Again pt encouraged to allow protocols to be completed due to the wait time. Pt refusing. First RN made aware and pt taken to sit with husband at front door. Encouraged pt husband via phone to let first RN know if pt starts to feel worse, needs assistance or will allow protocols to be completed.

## 2019-05-17 ENCOUNTER — Telehealth: Payer: Self-pay | Admitting: Emergency Medicine

## 2019-05-17 NOTE — Telephone Encounter (Signed)
Called patient due to lwot to inquire about condition and follow up plans. No answer no voicemail.

## 2021-08-22 ENCOUNTER — Ambulatory Visit
Admission: RE | Admit: 2021-08-22 | Discharge: 2021-08-22 | Disposition: A | Discharge status: Home / Self Care | Payer: Medicare Other | Source: Ambulatory Visit | Attending: Internal Medicine | Admitting: Internal Medicine

## 2021-08-22 ENCOUNTER — Other Ambulatory Visit: Payer: Self-pay | Admitting: Internal Medicine

## 2021-08-22 ENCOUNTER — Other Ambulatory Visit: Payer: Self-pay

## 2021-08-22 DIAGNOSIS — R0600 Dyspnea, unspecified: Secondary | ICD-10-CM | POA: Insufficient documentation

## 2021-08-22 LAB — POCT I-STAT CREATININE: Creatinine, Ser: 1.6 mg/dL — ABNORMAL HIGH (ref 0.44–1.00)

## 2021-08-22 MED ORDER — IOHEXOL 350 MG/ML SOLN
75.0000 mL | Freq: Once | INTRAVENOUS | Status: AC | PRN
  Administered 2021-08-22: 13:00:00 75 mL via INTRAVENOUS

## 2021-11-20 ENCOUNTER — Emergency Department: Payer: Medicare Other

## 2021-11-20 ENCOUNTER — Inpatient Hospital Stay
Admission: EM | Admit: 2021-11-20 | Discharge: 2021-11-26 | DRG: 682 | Disposition: A | Discharge status: Home Health | Payer: Medicare Other | Source: Ambulatory Visit | Attending: Internal Medicine | Admitting: Internal Medicine

## 2021-11-20 ENCOUNTER — Other Ambulatory Visit: Payer: Self-pay

## 2021-11-20 DIAGNOSIS — Z79890 Hormone replacement therapy (postmenopausal): Secondary | ICD-10-CM

## 2021-11-20 DIAGNOSIS — N1832 Chronic kidney disease, stage 3b: Secondary | ICD-10-CM | POA: Diagnosis present

## 2021-11-20 DIAGNOSIS — M79605 Pain in left leg: Secondary | ICD-10-CM | POA: Diagnosis present

## 2021-11-20 DIAGNOSIS — Z7952 Long term (current) use of systemic steroids: Secondary | ICD-10-CM

## 2021-11-20 DIAGNOSIS — N179 Acute kidney failure, unspecified: Principal | ICD-10-CM | POA: Diagnosis present

## 2021-11-20 DIAGNOSIS — Z85828 Personal history of other malignant neoplasm of skin: Secondary | ICD-10-CM

## 2021-11-20 DIAGNOSIS — G2581 Restless legs syndrome: Secondary | ICD-10-CM | POA: Diagnosis present

## 2021-11-20 DIAGNOSIS — E861 Hypovolemia: Secondary | ICD-10-CM | POA: Diagnosis present

## 2021-11-20 DIAGNOSIS — Z9011 Acquired absence of right breast and nipple: Secondary | ICD-10-CM

## 2021-11-20 DIAGNOSIS — C50911 Malignant neoplasm of unspecified site of right female breast: Secondary | ICD-10-CM | POA: Diagnosis present

## 2021-11-20 DIAGNOSIS — R079 Chest pain, unspecified: Secondary | ICD-10-CM

## 2021-11-20 DIAGNOSIS — Z923 Personal history of irradiation: Secondary | ICD-10-CM

## 2021-11-20 DIAGNOSIS — Z8249 Family history of ischemic heart disease and other diseases of the circulatory system: Secondary | ICD-10-CM

## 2021-11-20 DIAGNOSIS — Z86718 Personal history of other venous thrombosis and embolism: Secondary | ICD-10-CM

## 2021-11-20 DIAGNOSIS — I129 Hypertensive chronic kidney disease with stage 1 through stage 4 chronic kidney disease, or unspecified chronic kidney disease: Secondary | ICD-10-CM | POA: Diagnosis present

## 2021-11-20 DIAGNOSIS — G9341 Metabolic encephalopathy: Secondary | ICD-10-CM

## 2021-11-20 DIAGNOSIS — I82409 Acute embolism and thrombosis of unspecified deep veins of unspecified lower extremity: Secondary | ICD-10-CM | POA: Diagnosis present

## 2021-11-20 DIAGNOSIS — E890 Postprocedural hypothyroidism: Secondary | ICD-10-CM | POA: Diagnosis present

## 2021-11-20 DIAGNOSIS — G4700 Insomnia, unspecified: Secondary | ICD-10-CM | POA: Diagnosis present

## 2021-11-20 DIAGNOSIS — D631 Anemia in chronic kidney disease: Secondary | ICD-10-CM | POA: Diagnosis present

## 2021-11-20 DIAGNOSIS — Z9221 Personal history of antineoplastic chemotherapy: Secondary | ICD-10-CM

## 2021-11-20 DIAGNOSIS — Z8616 Personal history of COVID-19: Secondary | ICD-10-CM

## 2021-11-20 DIAGNOSIS — E8729 Other acidosis: Secondary | ICD-10-CM

## 2021-11-20 DIAGNOSIS — E8721 Acute metabolic acidosis: Secondary | ICD-10-CM | POA: Diagnosis present

## 2021-11-20 DIAGNOSIS — Z7901 Long term (current) use of anticoagulants: Secondary | ICD-10-CM

## 2021-11-20 DIAGNOSIS — Z853 Personal history of malignant neoplasm of breast: Secondary | ICD-10-CM

## 2021-11-20 DIAGNOSIS — G629 Polyneuropathy, unspecified: Secondary | ICD-10-CM

## 2021-11-20 DIAGNOSIS — Z85038 Personal history of other malignant neoplasm of large intestine: Secondary | ICD-10-CM

## 2021-11-20 DIAGNOSIS — I2699 Other pulmonary embolism without acute cor pulmonale: Secondary | ICD-10-CM | POA: Diagnosis present

## 2021-11-20 DIAGNOSIS — Z86711 Personal history of pulmonary embolism: Secondary | ICD-10-CM

## 2021-11-20 DIAGNOSIS — E7800 Pure hypercholesterolemia, unspecified: Secondary | ICD-10-CM | POA: Diagnosis present

## 2021-11-20 DIAGNOSIS — E039 Hypothyroidism, unspecified: Secondary | ICD-10-CM | POA: Diagnosis present

## 2021-11-20 DIAGNOSIS — Z20822 Contact with and (suspected) exposure to covid-19: Secondary | ICD-10-CM | POA: Diagnosis present

## 2021-11-20 DIAGNOSIS — Z902 Acquired absence of lung [part of]: Secondary | ICD-10-CM

## 2021-11-20 DIAGNOSIS — Z79899 Other long term (current) drug therapy: Secondary | ICD-10-CM

## 2021-11-20 DIAGNOSIS — K219 Gastro-esophageal reflux disease without esophagitis: Secondary | ICD-10-CM | POA: Diagnosis present

## 2021-11-20 LAB — BASIC METABOLIC PANEL
Anion gap: 16 — ABNORMAL HIGH (ref 5–15)
BUN: 120 mg/dL — ABNORMAL HIGH (ref 8–23)
CO2: 16 mmol/L — ABNORMAL LOW (ref 22–32)
Calcium: 6.3 mg/dL — CL (ref 8.9–10.3)
Chloride: 105 mmol/L (ref 98–111)
Creatinine, Ser: 8.75 mg/dL — ABNORMAL HIGH (ref 0.44–1.00)
GFR, Estimated: 4 mL/min — ABNORMAL LOW (ref >60)
Glucose, Bld: 164 mg/dL — ABNORMAL HIGH (ref 70–99)
Potassium: 4 mmol/L (ref 3.5–5.1)
Sodium: 137 mmol/L (ref 135–145)

## 2021-11-20 LAB — AMMONIA: Ammonia: 35 umol/L (ref 9–35)

## 2021-11-20 LAB — RESP PANEL BY RT-PCR (FLU A&B, COVID) ARPGX2
Influenza A by PCR: NEGATIVE
Influenza B by PCR: NEGATIVE
SARS Coronavirus 2 by RT PCR: NEGATIVE

## 2021-11-20 LAB — PHOSPHORUS: Phosphorus: 5.7 mg/dL — ABNORMAL HIGH (ref 2.5–4.6)

## 2021-11-20 LAB — T4, FREE: Free T4: 0.8 ng/dL (ref 0.61–1.12)

## 2021-11-20 LAB — TROPONIN I (HIGH SENSITIVITY): Troponin I (High Sensitivity): 12 ng/L (ref <18)

## 2021-11-20 LAB — MAGNESIUM: Magnesium: 1.5 mg/dL — ABNORMAL LOW (ref 1.7–2.4)

## 2021-11-20 LAB — TSH: TSH: 0.22 u[IU]/mL — ABNORMAL LOW (ref 0.350–4.500)

## 2021-11-20 MED ORDER — LORAZEPAM 1 MG PO TABS
1.0000 mg | ORAL_TABLET | Freq: Every evening | ORAL | Status: DC | PRN
  Administered 2021-11-21 – 2021-11-22 (×2): 1 mg via ORAL
  Filled 2021-11-20 (×2): qty 1

## 2021-11-20 MED ORDER — ONDANSETRON HCL 4 MG/2ML IJ SOLN: 4.0000 mg | Freq: Four times a day (QID) | INTRAMUSCULAR | Status: DC | PRN

## 2021-11-20 MED ORDER — CALCIUM GLUCONATE-NACL 1-0.675 GM/50ML-% IV SOLN
1.0000 g | Freq: Once | INTRAVENOUS | Status: AC
  Administered 2021-11-20: 1000 mg via INTRAVENOUS
  Filled 2021-11-20: qty 50

## 2021-11-20 MED ORDER — MAGNESIUM SULFATE 2 GM/50ML IV SOLN
2.0000 g | Freq: Once | INTRAVENOUS | Status: AC
  Administered 2021-11-20: 2 g via INTRAVENOUS
  Filled 2021-11-20: qty 50

## 2021-11-20 MED ORDER — VITAMIN D 25 MCG (1000 UNIT) PO TABS
5000.0000 [IU] | ORAL_TABLET | Freq: Every morning | ORAL | Status: DC
  Administered 2021-11-21 – 2021-11-26 (×6): 5000 [IU] via ORAL
  Filled 2021-11-20 (×6): qty 5

## 2021-11-20 MED ORDER — LEVOTHYROXINE SODIUM 50 MCG PO TABS
125.0000 ug | ORAL_TABLET | Freq: Every day | ORAL | Status: DC
  Administered 2021-11-21 – 2021-11-26 (×6): 125 ug via ORAL
  Filled 2021-11-20 (×5): qty 1
  Filled 2021-11-20: qty 3

## 2021-11-20 MED ORDER — ACETAMINOPHEN 650 MG RE SUPP: 650.0000 mg | Freq: Four times a day (QID) | RECTAL | Status: AC | PRN

## 2021-11-20 MED ORDER — PANTOPRAZOLE SODIUM 40 MG PO TBEC
40.0000 mg | DELAYED_RELEASE_TABLET | Freq: Every day | ORAL | Status: DC
  Administered 2021-11-21 – 2021-11-26 (×7): 40 mg via ORAL
  Filled 2021-11-20 (×7): qty 1

## 2021-11-20 MED ORDER — SIMVASTATIN 20 MG PO TABS
20.0000 mg | ORAL_TABLET | Freq: Every day | ORAL | Status: DC
  Administered 2021-11-21 – 2021-11-25 (×4): 20 mg via ORAL
  Filled 2021-11-20 (×4): qty 1

## 2021-11-20 MED ORDER — FLUTICASONE PROPIONATE 50 MCG/ACT NA SUSP: 2.0000 | Freq: Every day | NASAL | Status: DC | PRN

## 2021-11-20 MED ORDER — ACETAMINOPHEN 325 MG PO TABS: 650.0000 mg | ORAL_TABLET | Freq: Four times a day (QID) | ORAL | Status: AC | PRN

## 2021-11-20 MED ORDER — ONDANSETRON HCL 4 MG PO TABS: 4.0000 mg | ORAL_TABLET | Freq: Four times a day (QID) | ORAL | Status: DC | PRN

## 2021-11-20 MED ORDER — APIXABAN 5 MG PO TABS
5.0000 mg | ORAL_TABLET | Freq: Two times a day (BID) | ORAL | Status: DC
  Administered 2021-11-21 – 2021-11-26 (×12): 5 mg via ORAL
  Filled 2021-11-20 (×12): qty 1

## 2021-11-20 MED ORDER — POLYETHYLENE GLYCOL 3350 17 G PO PACK: 17.0000 g | PACK | Freq: Every day | ORAL | Status: DC | PRN

## 2021-11-20 MED ORDER — GABAPENTIN 300 MG PO CAPS
600.0000 mg | ORAL_CAPSULE | Freq: Every day | ORAL | Status: DC
  Administered 2021-11-21 – 2021-11-25 (×6): 600 mg via ORAL
  Filled 2021-11-20 (×6): qty 2

## 2021-11-20 MED ORDER — LACTATED RINGERS IV BOLUS
1000.0000 mL | Freq: Once | INTRAVENOUS | Status: AC
Start: 2021-11-20 — End: 2021-11-20
  Administered 2021-11-20: 1000 mL via INTRAVENOUS

## 2021-11-20 MED ORDER — LACTATED RINGERS IV BOLUS
1000.0000 mL | Freq: Once | INTRAVENOUS | Status: AC
  Administered 2021-11-20: 1000 mL via INTRAVENOUS

## 2021-11-20 NOTE — ED Provider Notes (Signed)
? ?[facility] ?Provider Note ? ? ? Event Date/Time  ? First MD Initiated Contact with Patient 11/20/21 1455   ?  (approximate) ? ? ?History  ? ?Weakness and Abnormal Lab ? ? ?HPI ? ?[name] [name] is a 71 y.o. female with a past medical history of PE on Eliquis, GERD, HDL, breast cancer, anxiety, colon cancer and recent diagnosis of COVID approximately 10 days ago on home test percent at bedside who presents after being referred to the ED from medicine clinic where patient was seen for evaluation of worsening fatigue, mild confusion on 7 chest tightness over the last couple days.  Patient was being seen for a follow-up visit since she was recently diagnosed with COVID.  She did complete a course of Paxlovid as well as 7-day course of doxycycline and percent of bed left-sided course of prednisone.  He feels that she has been little more confused.  She states she just feels very tired and is not sure when the chest tightness began.  She does not have any new shortness of breath and her cough is been improving.  She has been peeing less than usual and has had very little appetite.  She is not sure if she has any vomiting or diarrhea.  She states she has not had any falls.  No other clear alleviating aggravating factors.  She lives with her spouse.  Patient and son deny any recent NSAIDs. ? ?  ?Past Medical History:  ?Diagnosis Date  ? Anxiety   ? Cancer [facility])   ? 2006 right side breast ca. lumpectomy, skin ca on same breast   ? GERD (gastroesophageal reflux disease)   ? Hypercholesteremia   ? Peripheral neuropathic pain   ? Personal history of chemotherapy   ? 2006/2014  ? Personal history of radiation therapy   ? 2006/2014  ? Pulmonary emboli ([place])   ? Urinary incontinence   ? ? ? ?Physical Exam  ?Triage Vital Signs: ?ED Triage Vitals [11/20/21 1450]  ?Enc Vitals Group  ?   BP 125/77  ?   Pulse Rate 72  ?   Resp 16  ?   Temp 97.6 ?F (36.4 ?C)  ?   Temp Source Oral  ?   SpO2 94 %  ?   Weight   ?    Height   ?   Head Circumference   ?   Peak Flow   ?   Pain Score   ?   Pain Loc   ?   Pain Edu?   ?   Excl. in [place]?   ? ? ?Most recent vital signs: ?Vitals:  ? 11/20/21 1530 11/20/21 1730  ?BP: 131/72 (!) 147/89  ?Pulse: 64 64  ?Resp: 18 19  ?Temp:    ?SpO2: 97% 99%  ? ? ?General: Awake, ill-appearing.  Not oriented to month or time but oriented to year and location. ?CV:  Prolonged capillary fill in the digits.  2+ radial pulse.  Slight systolic murmur. ?Resp:  Normal effort.  Clear bilaterally. ?Abd:  No distention.  Soft. ?Other:  Mucous membranes.  Cranial nerves II to XII are grossly intact.  Patient moves all extremities with symmetric strength.  Sensation is intact to light touch all extremities. ? ? ?ED Results / Procedures / Treatments  ?Labs ?(all labs ordered are listed, but only abnormal results are displayed) ?Labs Reviewed  ?TSH - Abnormal; Notable for the following components:  ?    Result Value  ?  TSH 0.220 (*)   ? All other components within normal limits  ?BASIC METABOLIC PANEL - Abnormal; Notable for the following components:  ? CO2 16 (*)   ? Glucose, Bld 164 (*)   ? BUN 120 (*)   ? Creatinine, Ser 8.75 (*)   ? Calcium 6.3 (*)   ? GFR, Estimated 4 (*)   ? Anion gap 16 (*)   ? All other components within normal limits  ?MAGNESIUM - Abnormal; Notable for the following components:  ? Magnesium 1.5 (*)   ? All other components within normal limits  ?PHOSPHORUS - Abnormal; Notable for the following components:  ? Phosphorus 5.7 (*)   ? All other components within normal limits  ?RESP PANEL BY RT-PCR (FLU A&B, COVID) ARPGX2  ?AMMONIA  ?URINALYSIS, COMPLETE (UACMP) WITH MICROSCOPIC  ?T4, FREE  ?TROPONIN I (HIGH SENSITIVITY)  ?TROPONIN I (HIGH SENSITIVITY)  ? ? ? ?EKG ? ?ECG is remarkable sinus rhythm with right bundle branch block, ventricular rate of 74, normal axis, some nonspecific ST change in lead III, V3 without other clear evidence of acute ischemia or significant arrhythmia. ? ? ?RADIOLOGY ? ?CT  head on my interpretation without evidence of ischemia, edema, mass effect hemorrhage or other acute cranial process.  I also reviewed radiology's interpretation.  I agree with chronic changes without any acute process. ? ?CT abdomen pelvis on my interpretation without evidence of hydronephrosis, bladder distention, perinephric stranding or other clear acute abdominal or pelvic process.  I reviewed radiology's interpretation and agree to findings of no acute process but small umbilical hernia containing nonobstructed loop of small bowel as well as cholelithiasis diverticulosis without diverticulitis. ? ?Chest reviewed by myself shows no focal consoidation, effusion, edema, pneumothorax or other clear acute thoracic process. I also reviewed radiology interpretation and agree with findings described.  I agree with notation of some stable postop changes in the right hemithorax. ? ? ?PROCEDURES: ? ?Critical Care performed: Yes, see critical care procedure note(s) ? ?.Critical Care ?Performed by: Lucrezia Starch, MD ?Authorized by: Lucrezia Starch, MD  ? ?Critical care provider statement:  ?  Critical care time (minutes):  30 ?  Critical care was necessary to treat or prevent imminent or life-threatening deterioration of the following conditions:  Dehydration and metabolic crisis ?  Critical care was time spent personally by me on the following activities:  Development of treatment plan with patient or surrogate, discussions with consultants, evaluation of patient's response to treatment, examination of patient, ordering and review of laboratory studies, ordering and review of radiographic studies, ordering and performing treatments and interventions, pulse oximetry, re-evaluation of patient's condition and review of old charts ?.1-3 Lead EKG Interpretation ?Performed by: Lucrezia Starch, MD ?Authorized by: Lucrezia Starch, MD  ? ?  Interpretation: non-specific   ?  ECG rate assessment: normal   ?  Rhythm: sinus  rhythm   ?  Ectopy: none   ?  Conduction: abnormal   ? ?The patient is on the cardiac monitor to evaluate for evidence of arrhythmia and/or significant heart rate changes. ? ? ?MEDICATIONS ORDERED IN ED: ?Medications  ?calcium gluconate 1 g/ 50 mL sodium chloride IVPB (has no administration in time range)  ?magnesium sulfate IVPB 2 g 50 mL (has no administration in time range)  ?lactated ringers bolus 1,000 mL (has no administration in time range)  ?lactated ringers bolus 1,000 mL (0 mLs Intravenous Stopped 11/20/21 1720)  ? ? ? ?IMPRESSION / MDM / ASSESSMENT AND PLAN /  ED COURSE  ?I reviewed the triage vital signs and the nursing notes. ?             ?               ? ?I was able to review labs obtained earlier today clinic which prompted patient ED referral. ? ?CBC without leukocytosis or acute anemia and normal platelets.  BMP remarkable for bicarb of 17.4, BUN of 120, creatinine of 9.3 with a GFR of less than 4 and calcium of 6.3 without any other significant joint or metabolic derangements.  This compared to a CMP from 08/22/2021 that showed a creatinine of 1.3 and a GFR of 40. ? ?Differential diagnosis includes, but is not limited to acute renal failure from decreased p.o. intake, and with possible diarrhea, ATN versus obstructive uropathy.  Patient overall appears very dry on exam and I suspect there is at least a significant portion of prerenal injury today. ? ?She is confused which I suspect is likely from her uremia other given she is anticoagulated and not completely oriented obtain a CT head to assess for acute intracranial hemorrhage as well as CT of the abdomen pelvis to assess for any acute obstructive uropathy.  There is no clear focal deficits to suggest CVA and overall have a low suspicion for acute toxic ingestion. ? ?With regard to her chest tightness she reports she is compliant with her Eliquis stable lower suspicion for PE.  Obtain chest x-ray to assess for any evidence of developing bacterial  pneumonia as well as a EKG and troponin to assess for any evidence of acute cardiac ischemia. ? ?ECG is remarkable sinus rhythm with right bundle branch block, ventricular rate of 74, normal axis, some n

## 2021-11-20 NOTE — Assessment & Plan Note (Signed)
-   History of PE presumed secondary to DVT, resumed home Eliquis 5 mg p.o. twice daily ?

## 2021-11-20 NOTE — ED Notes (Signed)
Patient transported to CT 

## 2021-11-20 NOTE — ED Notes (Signed)
Pt given sandwich tray 

## 2021-11-20 NOTE — H&P (Signed)
?History and Physical  ? ?[name] [name] LZJ:[id number] DOB: 08/30/1950 DOA: 11/20/2021 ? ?PCP: Rusty Aus, MD  ?Outpatient Specialists: Dr. Angelina Ok, medical oncology ?Patient coming from: [place] [facility] ? ?I have personally briefly reviewed patient's old medical records in [place]. ? ?Chief Concern: Acute kidney injury, at the advice of PCP at [facility] ? ?HPI: Ms. 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 ? ?Initial vitals in the emergency department showed temperature of 97.6, respiration rate of 16, heart rate 72, blood pressure 125/77, SPO2 of 94% on room air. ? ?Serum sodium 137, potassium 4.0, chloride 105, bicarb 16, BUN of 120, serum creatinine of 8.75, nonfasting blood glucose 164. ? ?CBC performed at [facility] clinic showed WBC of 8.1, hemoglobin 13.7, platelets of 208. ? ?High-sensitivity troponin was 12.  Phosphorus was 5.7. ? ?COVID/influenza A/influenza B PCR were negative. ? ?ED treatment: Calcium gluconate 1 g, LR 2 L bolus, magnesium 2 g IV. ? ?At bedside, she is able to tell me her name, age, current location. She is slowed in her reponse, though I do not know her at baseline.  ? ?She does not remember why she is in the hospital. ? ?She reports her left lower leg has been hurting her for two weeks, she denies trauma.  ? ?She states that maybe she has been having a cough for two days, maybe a productive, maybe of yellow sputum. She states she is unsure. She denies fever, nausea, vomiting, chest pain, shortness of breath. She denies dysuria, hematuria,  diarrhea. She states her last BM was two days ago and that is normal for her.  ? ?Social history: She lives with her husband. She denies history of tobacco, etoh, recreational drug use. 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  ? ?Vaccination history: She is vaccinated for covid and influenza.  ? ?ROS: ?Constitutional: no weight change, no fever ?ENT/Mouth: no sore throat, no rhinorrhea ?Eyes: no eye pain, no vision changes ?Cardiovascular: no chest pain, no dyspnea,  no edema, no palpitations ?Respiratory: no cough, no sputum, no wheezing ?Gastrointestinal: no nausea, no vomiting, no diarrhea, no constipation ?Genitourinary: no urinary incontinence, no dysuria, no hematuria ?Musculoskeletal: no arthralgias, no myalgias ?Skin: no skin lesions, no pruritus, ?Neuro: + weakness, no loss of consciousness, no syncope ?Psych: no anxiety, no depression, + decrease appetite ?Heme/Lymph: no bruising, no bleeding ? ?ED Course: Discussed with emergency medicine provider, patient requiring hospitalization for chief concerns of acute kidney injury. ? ?Assessment/Plan ? ?Principal Problem: ?  AKI (acute kidney injury) ([place]) ?Active Problems: ?  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  Pulmonary embolism ([place]) ?  Pure hypercholesterolemia ?  DVT (deep venous thrombosis) ([place]) ?  Hypothyroidism ?  Neuropathy ?  Left leg pain ?  Hypocalcemia ?  ?Assessment and Plan: ? ?* AKI (acute kidney injury) ([place]) ?- Etiology work-up in progress ?- Ultrasound of the renal has been ordered ?- ED provider has consulted nephrology, Dr. Candiss Norse who states that he will see the patient ?- Epic order placed for nephrology consultation ?- Admit to telemetry cardiac, observation ?- BMP in the a.m. ? ?Hypocalcemia ?- Status  post calcium gluconate 1 g per EDP ?- Check calcium gluconate ?- BMP in the a.m. ? ?Left leg pain ?- Bilateral lower extremity ultrasound ordered to assess for DVT ? ?Neuropathy ?-  Resumed home gabapentin 600 mg p.o. nightly ? ?Hypothyroidism ?- Resumed home levothyroxine 125 mcg daily ? ?Pure hypercholesterolemia ?- Resumed home simvastatin 20 mg daily ? ?Pulmonary embolism ([place]) ?- History of PE presumed secondary to DVT, resumed home Eliquis 5 mg p.o. twice daily ? ?Chart reviewed.  ? ?DVT prophylaxis: Eliquis ?Code Status: Full code ?Diet: Renal/carb modified ?Family Communication: No, she states her husband already knows she is here ?Disposition Plan: Pending clinical course ?Consults called: Nephrology ?Admission status: Telemetry cardiac, observation ? ?Past Medical History:  ?Diagnosis Date  ? Anxiety   ? Cancer [facility])   ? 2006 right side breast ca. lumpectomy, skin ca on same breast   ? GERD (gastroesophageal reflux disease)   ? Hypercholesteremia   ? Peripheral neuropathic pain   ? Personal history of chemotherapy   ? 2006/2014  ? Personal history of radiation therapy   ? 2006/2014  ? Pulmonary emboli ([place])   ? Urinary incontinence   ? ?Past Surgical History:  ?Procedure Laterality Date  ? APPENDECTOMY    ? CARDIAC CATHETERIZATION Left 05/23/2016  ? Procedure: Left Heart Cath and Coronary Angiography;  Surgeon: Isaias Cowman, MD;  Location: [place] [facility];  Service: Cardiovascular;  Laterality: Left;  ? IVC FILTER INSERTION N/A 08/19/2018  ? Procedure: IVC FILTER INSERTION;  Surgeon: Algernon Huxley, MD;  Location: [place] [facility];  Service: Cardiovascular;  Laterality: N/A;  ? MASTECTOMY Right 2006/2014  ? rt breast removal    ? cancer  ? ?Social History:  reports that she has never smoked. She has never used smokeless tobacco. She reports that she does not drink alcohol and does not use drugs. ? ?No Known Allergies ?Family History  ?Problem Relation Age of Onset  ? Asthma Mother   ? COPD Mother   ? CVA Father   ? Heart attack Father   ? Coronary artery disease Father   ? Schizophrenia Brother   ? Peripheral vascular disease Brother   ? Breast cancer Neg Hx   ? ?Family  history: Family history reviewed and not pertinent. ? ?Prior to Admission medications   ?Medication Sig Start Date End Date Taking? Authorizing Provider  ?apixaban (ELIQUIS) 5 MG TABS tablet Take 2 tablets (10 mg total) by mouth 2 (two) times daily. ?Patient taking differently: Take 5 mg by mouth 2 (two) times daily. 08/21/18  Yes Ojie, Jude, MD  ?Cholecalciferol 125 MCG (5000 UT) TABS Take by mouth every morning.    Yes [provider]  ?Cyanocobalamin 500 MCG TBDP Take by mouth every morning.    Yes [provider]  ?fluticasone (FLONASE) 50 MCG/ACT nasal spray Place 2 sprays into both nostrils daily.   Yes [provider]  ?gabapentin (NEURONTIN) 300 MG capsule Take 900 mg by mouth at bedtime.   Yes [provider]  ?levothyroxine (SYNTHROID, LEVOTHROID) 125 MCG tablet Take 125 mcg by mouth.  12/19/17 11/20/21 Yes [provider]  ?LORazepam (ATIVAN) 1 MG tablet Take 1 mg by mouth at bedtime.   Yes [provider]  ?omeprazole (PRILOSEC) 20 MG capsule Take 20 mg by mouth daily.   Yes [provider]  ?predniSONE (DELTASONE) 20 MG tablet Take 20 mg by mouth daily. 11/15/21 11/22/21 Yes [provider]  ?simvastatin (ZOCOR) 20 MG tablet Take  20 mg by mouth daily at 6 PM.   Yes [provider]  ?traZODone (DESYREL) 50 MG tablet Take 50 mg by mouth at bedtime. 10/16/21  Yes [provider]  ?acetaminophen (TYLENOL) 325 MG tablet Take 325 mg by mouth every 4 (four) hours as needed.    [provider]  ?temazepam (RESTORIL) 15 MG capsule Take 15 mg by mouth at bedtime as needed for sleep. ?Patient not taking: Reported on 11/20/2021 08/11/18   [provider]  ? ?Physical Exam: ?Vitals:  ? 11/20/21 1730 11/20/21 2130 11/20/21 2300 11/21/21 0030  ?BP: (!) 147/89 138/76 (!) 155/69 (!) 148/96  ?Pulse: 64 61 63 76  ?Resp: '19 14 14 12  '$ ?Temp:      ?TempSrc:      ?SpO2: 99% 96% 95% 97%  ? ?Constitutional: appears  age-appropriate, NAD, calm, comfortable ?Eyes: PERRL, lids and conjunctivae normal ?ENMT: Mucous membranes are moist. Posterior pharynx clear of any exudate or lesions. Age-appropriate dentition. Hearing appropriate ?Neck: nor

## 2021-11-20 NOTE — Hospital Course (Addendum)
71 year old female with hyperlipidemia, neuropathy, hypothyroid, insomnia, GERD, restless leg syndrome, history of radiation induced angiosarcoma, history of acute saddle pulmonary embolism with acute cor pulmonale in January 2020, left lower extremity DVT, on long-term Eliquis, anxiety, history of invasive papillary adenocarcinoma status post lobectomy, radiation, chemotherapy, presented from [place] clinic for acute kidney injury. ? ?Initial vitals in the ED: T- 97.6, respiration rate of 16, heart rate 72, blood pressure 125/77, SPO2 of 94% on room air. ?LABS-Sodium 137, k 4.0, bicarb 16, BUN of 120, creatinine of 8.75, nonfasting blood glucose 164. ?CBC performed at [facility] clinic showed WBC of 8.1, hemoglobin 13.7, platelets of 208.High-sensitivity troponin was 12.  Phosphorus was 5.7.COVID/influenza A/influenza B PCR were negative.  CT abdomen pelvis CT head, UA and chest x-ray unremarkable. ?In ED got calcium gluconate 1 g, LR 2 L bolus, magnesium 2 g IV. ?She was admitted for AKI and acute metabolic encephalopathy. ? ?  ? ?

## 2021-11-20 NOTE — Assessment & Plan Note (Signed)
-   Resumed home gabapentin 600 mg p.o. nightly ?

## 2021-11-20 NOTE — Assessment & Plan Note (Signed)
-   Resumed home levothyroxine 125 mcg daily ?

## 2021-11-20 NOTE — Assessment & Plan Note (Signed)
-   Resumed home simvastatin 20 mg daily ?

## 2021-11-20 NOTE — ED Notes (Signed)
Pt sent from [facility] for abnormal labs. Pt states she has been feeling weakness and dizziness since her recent Covid diagnosis. Pt states she had chest pain earlier today but no c/o chest pain at this time. Pt c/o SOB.  ?

## 2021-11-20 NOTE — ED Triage Notes (Signed)
Pt sent from PCP at [facility] with AKI, pt has been sick with covid for about the past 10 days, having increased weakness and dizziness with some pounding chest pain today. Pt is had labs completed today in care everywhere in chart.  ?

## 2021-11-21 ENCOUNTER — Observation Stay: Payer: Medicare Other

## 2021-11-21 ENCOUNTER — Encounter: Payer: Self-pay | Admitting: Internal Medicine

## 2021-11-21 DIAGNOSIS — G4700 Insomnia, unspecified: Secondary | ICD-10-CM | POA: Diagnosis present

## 2021-11-21 DIAGNOSIS — Z9221 Personal history of antineoplastic chemotherapy: Secondary | ICD-10-CM | POA: Diagnosis not present

## 2021-11-21 DIAGNOSIS — E7800 Pure hypercholesterolemia, unspecified: Secondary | ICD-10-CM | POA: Diagnosis present

## 2021-11-21 DIAGNOSIS — Z79899 Other long term (current) drug therapy: Secondary | ICD-10-CM | POA: Diagnosis not present

## 2021-11-21 DIAGNOSIS — N1832 Chronic kidney disease, stage 3b: Secondary | ICD-10-CM | POA: Diagnosis present

## 2021-11-21 DIAGNOSIS — E890 Postprocedural hypothyroidism: Secondary | ICD-10-CM | POA: Diagnosis present

## 2021-11-21 DIAGNOSIS — G629 Polyneuropathy, unspecified: Secondary | ICD-10-CM | POA: Diagnosis present

## 2021-11-21 DIAGNOSIS — G2581 Restless legs syndrome: Secondary | ICD-10-CM | POA: Diagnosis present

## 2021-11-21 DIAGNOSIS — M79605 Pain in left leg: Secondary | ICD-10-CM | POA: Diagnosis present

## 2021-11-21 DIAGNOSIS — I129 Hypertensive chronic kidney disease with stage 1 through stage 4 chronic kidney disease, or unspecified chronic kidney disease: Secondary | ICD-10-CM | POA: Diagnosis present

## 2021-11-21 DIAGNOSIS — E861 Hypovolemia: Secondary | ICD-10-CM | POA: Diagnosis present

## 2021-11-21 DIAGNOSIS — G9341 Metabolic encephalopathy: Secondary | ICD-10-CM

## 2021-11-21 DIAGNOSIS — K219 Gastro-esophageal reflux disease without esophagitis: Secondary | ICD-10-CM | POA: Diagnosis present

## 2021-11-21 DIAGNOSIS — Z8616 Personal history of COVID-19: Secondary | ICD-10-CM | POA: Diagnosis not present

## 2021-11-21 DIAGNOSIS — Z7952 Long term (current) use of systemic steroids: Secondary | ICD-10-CM | POA: Diagnosis not present

## 2021-11-21 DIAGNOSIS — Z79890 Hormone replacement therapy (postmenopausal): Secondary | ICD-10-CM | POA: Diagnosis not present

## 2021-11-21 DIAGNOSIS — Z20822 Contact with and (suspected) exposure to covid-19: Secondary | ICD-10-CM | POA: Diagnosis present

## 2021-11-21 DIAGNOSIS — N179 Acute kidney failure, unspecified: Secondary | ICD-10-CM | POA: Diagnosis present

## 2021-11-21 DIAGNOSIS — Z7901 Long term (current) use of anticoagulants: Secondary | ICD-10-CM | POA: Diagnosis not present

## 2021-11-21 DIAGNOSIS — D631 Anemia in chronic kidney disease: Secondary | ICD-10-CM | POA: Diagnosis present

## 2021-11-21 DIAGNOSIS — E8721 Acute metabolic acidosis: Secondary | ICD-10-CM | POA: Diagnosis present

## 2021-11-21 LAB — CBC
HCT: 39.9 % (ref 36.0–46.0)
Hemoglobin: 12.9 g/dL (ref 12.0–15.0)
MCH: 26.8 pg (ref 26.0–34.0)
MCHC: 32.3 g/dL (ref 30.0–36.0)
MCV: 82.8 fL (ref 80.0–100.0)
Platelets: 193 10*3/uL (ref 150–400)
RBC: 4.82 MIL/uL (ref 3.87–5.11)
RDW: 14.1 % (ref 11.5–15.5)
WBC: 7.8 10*3/uL (ref 4.0–10.5)
nRBC: 0 % (ref 0.0–0.2)

## 2021-11-21 LAB — HEPATIC FUNCTION PANEL
ALT: 10 U/L (ref 0–44)
AST: 15 U/L (ref 15–41)
Albumin: 2.9 g/dL — ABNORMAL LOW (ref 3.5–5.0)
Alkaline Phosphatase: 50 U/L (ref 38–126)
Bilirubin, Direct: <0.1 mg/dL (ref 0.0–0.2)
Total Bilirubin: 0.4 mg/dL (ref 0.3–1.2)
Total Protein: 6 g/dL — ABNORMAL LOW (ref 6.5–8.1)

## 2021-11-21 LAB — TROPONIN I (HIGH SENSITIVITY): Troponin I (High Sensitivity): 16 ng/L (ref <18)

## 2021-11-21 LAB — URINALYSIS, COMPLETE (UACMP) WITH MICROSCOPIC
Bacteria, UA: NONE SEEN
Bilirubin Urine: NEGATIVE
Glucose, UA: NEGATIVE mg/dL
Hgb urine dipstick: NEGATIVE
Ketones, ur: NEGATIVE mg/dL
Leukocytes,Ua: NEGATIVE
Nitrite: NEGATIVE
Protein, ur: NEGATIVE mg/dL
Specific Gravity, Urine: 1.008 (ref 1.005–1.030)
Squamous Epithelial / HPF: NONE SEEN (ref 0–5)
pH: 5 (ref 5.0–8.0)

## 2021-11-21 LAB — BASIC METABOLIC PANEL
Anion gap: 12 (ref 5–15)
BUN: 117 mg/dL — ABNORMAL HIGH (ref 8–23)
CO2: 20 mmol/L — ABNORMAL LOW (ref 22–32)
Calcium: 7.2 mg/dL — ABNORMAL LOW (ref 8.9–10.3)
Chloride: 110 mmol/L (ref 98–111)
Creatinine, Ser: 8.09 mg/dL — ABNORMAL HIGH (ref 0.44–1.00)
GFR, Estimated: 5 mL/min — ABNORMAL LOW (ref >60)
Glucose, Bld: 109 mg/dL — ABNORMAL HIGH (ref 70–99)
Potassium: 4.6 mmol/L (ref 3.5–5.1)
Sodium: 142 mmol/L (ref 135–145)

## 2021-11-21 MED ORDER — LORAZEPAM 2 MG/ML IJ SOLN
0.5000 mg | Freq: Four times a day (QID) | INTRAMUSCULAR | Status: DC | PRN
  Filled 2021-11-21: qty 1

## 2021-11-21 MED ORDER — MAGNESIUM OXIDE -MG SUPPLEMENT 400 (240 MG) MG PO TABS
400.0000 mg | ORAL_TABLET | Freq: Two times a day (BID) | ORAL | Status: AC
  Administered 2021-11-21 – 2021-11-23 (×6): 400 mg via ORAL
  Filled 2021-11-21 (×6): qty 1

## 2021-11-21 NOTE — Assessment & Plan Note (Signed)
-   Etiology work-up in progress ?- Ultrasound of the renal has been ordered ?- ED provider has consulted nephrology, Dr. Candiss Norse who states that he will see the patient ?- Epic order placed for nephrology consultation ?- Admit to telemetry cardiac, observation ?- BMP in the a.m. ?

## 2021-11-21 NOTE — Assessment & Plan Note (Signed)
-   Status post calcium gluconate 1 g per EDP ?- Check calcium gluconate ?- BMP in the a.m. ?

## 2021-11-21 NOTE — Consult Note (Signed)
?[name] [name]  ?CONSULT NOTE  ? ? ?Date: 11/21/2021      ?      ?      ?Patient Name:  [name] [name]  MRN: [id number]  ?DOB: 12-13-1950  Age / Sex: 71 y.o., female   ?      ?PCP: Rusty Aus, MD     ?      ?      ?Service Requesting Consult: TRH     ?      ?      ?Reason for Consult: Acute kidney injury     ?      ? ?History of Present Illness: ?Ms. [name] [name] is a 71 y.o.  female with medical concerns including neuropathy, GERD, hyperlipidemia radiation-induced angiosarcoma, who was admitted to [facility] on 11/20/2021 for AKI (acute kidney injury) ([place]) [N17.9] ? ?Patient presents to the emergency department from [place] clinic due to abnormal labs.  Acute kidney injury identified on outpatient lab work.  Patient currently unreliable historian.  Chart review conducted shows recent COVID-19 infection with bronchitis.  According to outpatient chart review, patient seen on April 21 by Dr. Sabra Heck for acute bronchitis with COVID-19.  Patient attended visit with son who states patient sleeps daily, complains of shortness of breath on exertion. No reports of diarrhea, nausea or vomiting. Patient states she takes Ibuprofen sometimes. No family at bedside.  ? ?Labs on arrival include creatinine 8.75 with GFR 4, calcium 6.3, BUN 120, and Bicarb 16.  Respiratory panel negative for influenza and COVID.  CT head shows chronic changes, nothing acute.  CT abdomen pelvis negative for hydronephrosis with small umbilical hernia.  Chest x-ray negative for acute findings.  Renal ultrasound negative.  Lower extremity Doppler negative for DVT.  Benign UA ? ? ?Medications: ?Outpatient medications: ?(Not in a hospital admission) ? ? ?Current medications: ?Current Facility-Administered Medications  ?Medication Dose Route Frequency Provider Last Rate Last Admin  ? acetaminophen (TYLENOL) tablet 650 mg  650 mg Oral Q6H PRN Cox, Amy N, DO      ? Or  ? acetaminophen (TYLENOL) suppository 650 mg  650 mg Rectal Q6H PRN  Cox, Amy N, DO      ? apixaban (ELIQUIS) tablet 5 mg  5 mg Oral BID Cox, Amy N, DO   5 mg at 11/21/21 0941  ? cholecalciferol (VITAMIN D3) tablet 5,000 Units  5,000 Units Oral q morning Cox, Amy N, DO   5,000 Units at 11/21/21 0031  ? fluticasone (FLONASE) 50 MCG/ACT nasal spray 2 spray  2 spray Each Nare Daily PRN Cox, Amy N, DO      ? gabapentin (NEURONTIN) capsule 600 mg  600 mg Oral QHS Cox, Amy N, DO   600 mg at 11/21/21 0031  ? levothyroxine (SYNTHROID) tablet 125 mcg  125 mcg Oral Q0600 Cox, Amy N, DO   125 mcg at 11/21/21 0940  ? LORazepam (ATIVAN) tablet 1 mg  1 mg Oral QHS PRN Cox, Amy N, DO      ? magnesium oxide (MAG-OX) tablet 400 mg  400 mg Oral BID Kc, Ramesh, MD   400 mg at 11/21/21 0941  ? ondansetron (ZOFRAN) tablet 4 mg  4 mg Oral Q6H PRN Cox, Amy N, DO      ? Or  ? ondansetron (ZOFRAN) injection 4 mg  4 mg Intravenous Q6H PRN Cox, Amy N, DO      ? pantoprazole (PROTONIX) EC tablet 40 mg  40  mg Oral Daily Cox, Amy N, DO   40 mg at 11/21/21 0941  ? polyethylene glycol (MIRALAX / GLYCOLAX) packet 17 g  17 g Oral Daily PRN Cox, Amy N, DO      ? simvastatin (ZOCOR) tablet 20 mg  20 mg Oral q1800 Cox, Amy N, DO      ? ?Current Outpatient Medications  ?Medication Sig Dispense Refill  ? apixaban (ELIQUIS) 5 MG TABS tablet Take 2 tablets (10 mg total) by mouth 2 (two) times daily. (Patient taking differently: Take 5 mg by mouth 2 (two) times daily.) 26 tablet 0  ? Cholecalciferol 125 MCG (5000 UT) TABS Take by mouth every morning.     ? Cyanocobalamin 500 MCG TBDP Take by mouth every morning.     ? fluticasone (FLONASE) 50 MCG/ACT nasal spray Place 2 sprays into both nostrils daily.    ? gabapentin (NEURONTIN) 300 MG capsule Take 900 mg by mouth at bedtime.    ? levothyroxine (SYNTHROID, LEVOTHROID) 125 MCG tablet Take 125 mcg by mouth.     ? LORazepam (ATIVAN) 1 MG tablet Take 1 mg by mouth at bedtime.    ? omeprazole (PRILOSEC) 20 MG capsule Take 20 mg by mouth daily.    ? predniSONE (DELTASONE) 20 MG  tablet Take 20 mg by mouth daily.    ? simvastatin (ZOCOR) 20 MG tablet Take 20 mg by mouth daily at 6 PM.    ? traZODone (DESYREL) 50 MG tablet Take 50 mg by mouth at bedtime.    ? acetaminophen (TYLENOL) 325 MG tablet Take 325 mg by mouth every 4 (four) hours as needed.    ? temazepam (RESTORIL) 15 MG capsule Take 15 mg by mouth at bedtime as needed for sleep. (Patient not taking: Reported on 11/20/2021)    ?  ? ? ?Allergies: ?No Known Allergies  ? ? ?Past Medical History: ?Past Medical History:  ?Diagnosis Date  ? Anxiety   ? Cancer [facility])   ? 2006 right side breast ca. lumpectomy, skin ca on same breast   ? GERD (gastroesophageal reflux disease)   ? Hypercholesteremia   ? Peripheral neuropathic pain   ? Personal history of chemotherapy   ? 2006/2014  ? Personal history of radiation therapy   ? 2006/2014  ? Pulmonary emboli ([place])   ? Urinary incontinence   ? ? ? ?Past Surgical History: ?Past Surgical History:  ?Procedure Laterality Date  ? APPENDECTOMY    ? CARDIAC CATHETERIZATION Left 05/23/2016  ? Procedure: Left Heart Cath and Coronary Angiography;  Surgeon: Isaias Cowman, MD;  Location: [place] [facility];  Service: Cardiovascular;  Laterality: Left;  ? IVC FILTER INSERTION N/A 08/19/2018  ? Procedure: IVC FILTER INSERTION;  Surgeon: Algernon Huxley, MD;  Location: [place] [facility];  Service: Cardiovascular;  Laterality: N/A;  ? MASTECTOMY Right 2006/2014  ? rt breast removal    ? cancer  ? ? ? ?Family History: ?Family History  ?Problem Relation Age of Onset  ? Asthma Mother   ? COPD Mother   ? CVA Father   ? Heart attack Father   ? Coronary artery disease Father   ? Schizophrenia Brother   ? Peripheral vascular disease Brother   ? Breast cancer Neg Hx   ? ? ? ?Social History: ?Social History  ? ?Socioeconomic History  ? Marital status: Married  ?  Spouse name: Not on file  ? Number of children: Not on file  ? Years of education: Not on  file  ? Highest education level: Not on file  ?Occupational History   ? Not on file  ?Tobacco Use  ? Smoking status: Never  ? Smokeless tobacco: Never  ?Vaping Use  ? Vaping Use: Never used  ?Substance and Sexual Activity  ? Alcohol use: Never  ? Drug use: No  ? Sexual activity: Not on file  ?Other Topics Concern  ? Not on file  ?Social History Narrative  ? Not on file  ? ?Social Determinants of Health  ? ?Financial Resource Strain: Not on file  ?Food Insecurity: Not on file  ?Transportation Needs: Not on file  ?Physical Activity: Not on file  ?Stress: Not on file  ?Social Connections: Not on file  ?Intimate Partner Violence: Not on file  ? ? ? ?Review of Systems: ?Review of Systems  ?Unable to perform ROS: Mental status change  ? ?Vital Signs: ?Blood pressure (!) 116/59, pulse (!) 59, temperature 97.6 ?F (36.4 ?C), temperature source Oral, resp. rate 13, SpO2 95 %. ? ?Weight trends: ?There were no vitals filed for this visit. ? ?Physical Exam: ?General: NAD, laying on stretcher  ?Head: Normocephalic, atraumatic. Moist oral mucosal membranes  ?Eyes: Anicteric  ?Lungs:  Clear with diminished bases, normal effort  ?Heart: Regular rate and rhythm  ?Abdomen:  Soft, nontender, obese  ?Extremities: No peripheral edema.  ?Neurologic: Alert, oriented to self, moving all extremities  ?Skin: No lesions  ?Access: None  ? ? ? ?Lab results: ?Basic Metabolic Panel: ?Recent Labs  ?Lab 11/20/21 ?1600 11/21/21 ?0320  ?NA 137 142  ?K 4.0 4.6  ?CL 105 110  ?CO2 16* 20*  ?GLUCOSE 164* 109*  ?BUN 120* 117*  ?CREATININE 8.75* 8.09*  ?CALCIUM 6.3* 7.2*  ?MG 1.5*  --   ?PHOS 5.7*  --   ? ? ?Liver Function Tests: ?Recent Labs  ?Lab 11/21/21 ?0320  ?AST 15  ?ALT 10  ?ALKPHOS 50  ?BILITOT 0.4  ?PROT 6.0*  ?ALBUMIN 2.9*  ? ?No results for input(s): LIPASE, AMYLASE in the last 168 hours. ?Recent Labs  ?Lab 11/20/21 ?1525  ?AMMONIA 35  ? ? ?CBC: ?Recent Labs  ?Lab 11/21/21 ?0320  ?WBC 7.8  ?HGB 12.9  ?HCT 39.9  ?MCV 82.8  ?PLT 193  ? ? ?Cardiac Enzymes: ?No results for input(s): CKTOTAL, CKMB, CKMBINDEX,  TROPONINI in the last 168 hours. ? ?BNP: ?Invalid input(s): POCBNP ? ?CBG: ?No results for input(s): GLUCAP in the last 168 hours. ? ?Microbiology: ?Results for orders placed or performed during the hospital en

## 2021-11-21 NOTE — Progress Notes (Signed)
?PROGRESS NOTE ?[name] [name]  SWH:[id number] DOB: 1950-10-15 DOA: 11/20/2021 ?PCP: Rusty Aus, MD  ? 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 ? ?Initial vitals in the ED: T- 97.6, respiration rate of 16, heart rate 72, blood pressure 125/77, SPO2 of 94% on room air. ?LABS-Sodium 137, k 4.0, bicarb 16, BUN of 120, creatinine of 8.75, nonfasting blood glucose 164. ?CBC performed at [facility] clinic showed WBC of 8.1, hemoglobin 13.7, platelets of 208.High-sensitivity troponin was 12.  Phosphorus was 5.7.COVID/influenza A/influenza B PCR were negative.  CT abdomen pelvis CT head, UA and chest x-ray unremarkable. ?In ED got calcium gluconate 1 g, LR 2 L bolus, magnesium 2 g IV. ?She was admitted for AKI hypocalcemia left leg pain neuropathy. ? ?  ?Subjective: ?Patient is confused, alert awake oriented to self, place- not to date and president. ?As per son-pt and family had covid few wks ago and has been " out of it" not being able to do anything, lethargic, unsteady foggy mind, confused and saw pcp and found ?  ?Assessment and Plan: ?Principal Problem: ?  AKI (acute kidney injury) ([place]) ?Active Problems: ?  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  Pulmonary embolism ([place]) ?  Pure hypercholesterolemia ?  DVT (deep venous thrombosis) ([place]) ?  Hypothyroidism ?  Neuropathy ?  Left leg pain ?  Hypocalcemia ?  Acute metabolic encephalopathy ?  ? ?AKI with metabolic acidosis: Unclear etiology.  Renal ultrasound no acute finding.  CT abdomen pelvis no hydronephrosis no acute finding.  Total urine  output 600 cc, nephrology has been consulted monitor renal function closely further work-up/IV fluids per nephrology.  EVA with negative hemoglobin nitrate LE,protein ? ?Acute metabolic encephalopathy patient appears mildly confused has been going on since her COVID recently.  Likely worsened by her AKI.  CT head no acute finding, nonfocal on exam.  Continue supportive care for precaution. ? ?Left leg pain: Continue pain control.  Duplex of the lower extremities negative for DVT ? ?Hyperphosphatemia question renal etiology ?Hypocalcemia: Up at 7.2 check albumin for correction ?Hypomagnesemia: Replete ? 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 ?Pure hypercholesterolemia:Continue simvastatin ? ?Hx of DVT/PE:continue Eliquis 5 twice daily ? ?Hypothyroidism: TSH suppressed 0.2 but normal free T4, continue on Synthroid 125. ? ?Neuropathy:Continue Neurontin at bedtime ? ?DVT prophylaxis: Place TED hose Start: 11/20/21 1844 ?Code Status:   Code Status: Full Code ?Family Communication: plan of care discussed with patient at bedside.  Called and updated patient's son. ?Patient status is: Observation but will need inpatient Level of care for ongoing management of AKI: Telemetry Cardiac  ?Remains inpatient because: ongoing aki management ?Patient currently not stable ? ?Dispo: The patient is from: home ?           Anticipated disposition: TBD ? ?Mobility Assessment (last 72 hours)   ? ? Mobility Assessment   ?No documentation. ? ?  ?  ? ?  ?  ? ?Objective: ?Vitals last 24 hrs: ?Vitals:  ? 11/21/21 0340 11/21/21 0430 11/21/21 0945 11/21/21 1000  ?BP: 135/77 133/68 129/86 (!) 116/59  ?Pulse: (!) 57 (!) 50 72 (!) 59  ?Resp: '18 14 20 13  '$ ?Temp:      ?  TempSrc:      ?SpO2: 96% 95% 93% 95%  ? ?Weight change:  ? ?Physical Examination: ?General exam: AA0x2,older than stated age, weak appearing. ?HEENT:Oral mucosa moist, Ear/Nose WNL grossly, dentition normal. ?Respiratory system: bilaterally diminished BS, no use of  accessory muscle ?Cardiovascular system: S1 & S2 +, No JVD,. ?Gastrointestinal system: Abdomen soft,NT,ND, BS+ ?Nervous System:Alert, awake, moving extremities and grossly nonfocal ?Extremities: LE edema mild,distal peripheral pulses palpable.  ?Skin: No rashes,no icterus. ?MSK: Normal muscle bulk,tone, power ? ?Medications reviewed: Scheduled Meds: ? apixaban  5 mg Oral BID  ? cholecalciferol  5,000 Units Oral q morning  ? gabapentin  600 mg Oral QHS  ? levothyroxine  125 mcg Oral Q0600  ? magnesium oxide  400 mg Oral BID  ? pantoprazole  40 mg Oral Daily  ? simvastatin  20 mg Oral q1800  ? ?Continuous Infusions: ? ?  ?Diet Order   ? ?       ?  Diet renal/carb modified with fluid restriction Diet-HS Snack? Nothing; Fluid restriction: 1200 mL Fluid; Room service appropriate? Yes; Fluid consistency: Thin  Diet effective now       ?  ? ?  ?  ? ?  ?  ? ?  ?  ?  ? ? ?Intake/Output Summary (Last 24 hours) at 12-21-21 1209 ?Last data filed at Dec 21, 2021 1011 ?Gross per 24 hour  ?Intake 2000 ml  ?Output 900 ml  ?Net 1100 ml  ? ?Net IO Since Admission: 1,100 mL [12/21/2021 1209]  ?Wt Readings from Last 3 Encounters:  ?05/16/19 90.7 kg  ?09/24/18 95.9 kg  ?09/04/18 94.8 kg  ?  ? ?Unresulted Labs (From admission, onward)  ? ?  Start     Ordered  ? 11/22/21 0500  CBC  Daily,   R     ? 12-21-2021 0750  ? 11/22/21 0500  Comprehensive metabolic panel  Daily,   R     ? Dec 21, 2021 0750  ? 2021/12/21 1010  Protein electrophoresis, serum  Once,   R       ? 12-21-2021 1010  ? 12-21-2021 0500  Calcium, ionized  Once,   R       ? 12-21-2021 0500  ? ?  ?  ? ?  ?Data Reviewed: I have personally reviewed following labs and imaging studies ?CBC: ?Recent Labs  ?Lab 2021/12/21 ?0320  ?WBC 7.8  ?HGB 12.9  ?HCT 39.9  ?MCV 82.8  ?PLT 193  ? ?Basic Metabolic Panel: ?Recent Labs  ?Lab 11/20/21 ?1600 12-21-21 ?0320  ?NA 137 142  ?K 4.0 4.6  ?CL 105 110  ?CO2 16* 20*  ?GLUCOSE 164* 109*  ?BUN 120* 117*  ?CREATININE 8.75* 8.09*  ?CALCIUM 6.3* 7.2*  ?MG 1.5*  --    ?PHOS 5.7*  --   ? ?GFR: ?CrCl cannot be calculated (Unknown ideal weight.). ?Liver Function Tests: ?Recent Labs  ?Lab 2021-12-21 ?0320  ?AST 15  ?ALT 10  ?ALKPHOS 50  ?BILITOT 0.4  ?PROT 6.0*  ?ALBUMIN 2.9*  ? ?No results for input(s): LIPASE, AMYLASE in the last 168 hours. ?Recent Labs  ?Lab 11/20/21 ?1525  ?AMMONIA 35  ? ?Coagulation Profile: ?No results for input(s): INR, PROTIME in the last 168 hours. ?BNP (last 3 results) ?No results for input(s): PROBNP in the last 8760 hours. ?HbA1C: ?No results for input(s): HGBA1C in the last 72 hours. ?CBG: ?No results for input(s): GLUCAP in the last 168 hours. ?Lipid Profile: ?No results for input(s): CHOL, HDL, LDLCALC, TRIG, CHOLHDL, LDLDIRECT  in the last 72 hours. ?Thyroid Function Tests: ?Recent Labs  ?  11/20/21 ?1525 11/20/21 ?1548  ?TSH 0.220*  --   ?FREET4  --  0.80  ? ?Sepsis Labs: ?No results for input(s): PROCALCITON, LATICACIDVEN in the last 168 hours. ? ?Recent Results (from the past 240 hour(s))  ?Resp Panel by RT-PCR (Flu A&B, Covid) Nasopharyngeal Swab     Status: None  ? Collection Time: 11/20/21  3:28 PM  ? Specimen: Nasopharyngeal Swab; Nasopharyngeal(NP) swabs in vial transport medium  ?Result Value Ref Range Status  ? SARS Coronavirus 2 by RT PCR NEGATIVE NEGATIVE Final  ?  Comment: (NOTE) ?SARS-CoV-2 target nucleic acids are NOT DETECTED. ? ?The SARS-CoV-2 RNA is generally detectable in upper respiratory ?specimens during the acute phase of infection. The lowest ?concentration of SARS-CoV-2 viral copies this assay can detect is ?138 copies/mL. A negative result does not preclude SARS-Cov-2 ?infection and should not be used as the sole basis for treatment or ?other patient management decisions. A negative result may occur with  ?improper specimen collection/handling, submission of specimen other ?than nasopharyngeal swab, presence of viral mutation(s) within the ?areas targeted by this assay, and inadequate number of viral ?copies(<138 copies/mL). A  negative result must be combined with ?clinical observations, patient history, and epidemiological ?information. The expected result is Negative. ? ?Fact Sheet for Patients:  ?[email or web address]

## 2021-11-21 NOTE — ED Notes (Signed)
Ultrasound at bedside

## 2021-11-21 NOTE — Assessment & Plan Note (Signed)
-   Bilateral lower extremity ultrasound ordered to assess for DVT ?

## 2021-11-21 NOTE — Progress Notes (Signed)
Admission profile updated. ?

## 2021-11-21 NOTE — ED Notes (Signed)
This RN at bedside while patient called husband on cell phone ?

## 2021-11-22 DIAGNOSIS — N179 Acute kidney failure, unspecified: Secondary | ICD-10-CM | POA: Diagnosis not present

## 2021-11-22 LAB — COMPREHENSIVE METABOLIC PANEL
ALT: 10 U/L (ref 0–44)
AST: 13 U/L — ABNORMAL LOW (ref 15–41)
Albumin: 2.8 g/dL — ABNORMAL LOW (ref 3.5–5.0)
Alkaline Phosphatase: 47 U/L (ref 38–126)
Anion gap: 11 (ref 5–15)
BUN: 135 mg/dL — ABNORMAL HIGH (ref 8–23)
CO2: 18 mmol/L — ABNORMAL LOW (ref 22–32)
Calcium: 7.4 mg/dL — ABNORMAL LOW (ref 8.9–10.3)
Chloride: 111 mmol/L (ref 98–111)
Creatinine, Ser: 7.26 mg/dL — ABNORMAL HIGH (ref 0.44–1.00)
GFR, Estimated: 6 mL/min — ABNORMAL LOW (ref >60)
Glucose, Bld: 127 mg/dL — ABNORMAL HIGH (ref 70–99)
Potassium: 4.7 mmol/L (ref 3.5–5.1)
Sodium: 140 mmol/L (ref 135–145)
Total Bilirubin: 0.3 mg/dL (ref 0.3–1.2)
Total Protein: 5.7 g/dL — ABNORMAL LOW (ref 6.5–8.1)

## 2021-11-22 LAB — CBC
HCT: 37.3 % (ref 36.0–46.0)
Hemoglobin: 12.3 g/dL (ref 12.0–15.0)
MCH: 27.5 pg (ref 26.0–34.0)
MCHC: 33 g/dL (ref 30.0–36.0)
MCV: 83.4 fL (ref 80.0–100.0)
Platelets: 167 10*3/uL (ref 150–400)
RBC: 4.47 MIL/uL (ref 3.87–5.11)
RDW: 14.2 % (ref 11.5–15.5)
WBC: 8.1 10*3/uL (ref 4.0–10.5)
nRBC: 0 % (ref 0.0–0.2)

## 2021-11-22 LAB — MAGNESIUM: Magnesium: 1.7 mg/dL (ref 1.7–2.4)

## 2021-11-22 LAB — CALCIUM, IONIZED: Calcium, Ionized, Serum: 4.1 mg/dL — ABNORMAL LOW (ref 4.5–5.6)

## 2021-11-22 MED ORDER — SODIUM BICARBONATE 8.4 % IV SOLN
INTRAVENOUS | Status: DC
  Filled 2021-11-22 (×3): qty 150
  Filled 2021-11-22 (×2): qty 1000
  Filled 2021-11-22: qty 150

## 2021-11-22 MED ORDER — CHLORHEXIDINE GLUCONATE CLOTH 2 % EX PADS
6.0000 | MEDICATED_PAD | Freq: Every day | CUTANEOUS | Status: DC
  Administered 2021-11-23 – 2021-11-26 (×4): 6 via TOPICAL

## 2021-11-22 NOTE — Progress Notes (Signed)
?PROGRESS NOTE ?[name] [name]  YQM:[id number] DOB: 02/20/1951 DOA: 11/20/2021 ?PCP: Rusty Aus, MD  ? ?Brief Narrative/Hospital Course: ?71 year old female with hyperlipidemia, neuropathy, hypothyroid, insomnia, GERD, restless leg syndrome, history of radiation induced angiosarcoma, history of acute saddle pulmonary embolism with acute cor pulmonale in January 2020, left lower extremity DVT, on long-term Eliquis, anxiety, history of invasive papillary adenocarcinoma status post lobectomy, radiation, chemotherapy, presented from [place] clinic for acute kidney injury. ? ?Initial vitals in the ED: T- 97.6, respiration rate of 16, heart rate 72, blood pressure 125/77, SPO2 of 94% on room air. ?LABS-Sodium 137, k 4.0, bicarb 16, BUN of 120, creatinine of 8.75, nonfasting blood glucose 164. ?CBC performed at [facility] clinic showed WBC of 8.1, hemoglobin 13.7, platelets of 208.High-sensitivity troponin was 12.  Phosphorus was 5.7.COVID/influenza A/influenza B PCR were negative.  CT abdomen pelvis CT head, UA and chest x-ray unremarkable. ?In ED got calcium gluconate 1 g, LR 2 L bolus, magnesium 2 g IV. ?She was admitted for AKI and acute metabolic encephalopathy. ? ?  ?Subjective: ?Seen and examined this morning.  Patient feels well she is alert awake oriented with minimal confusion,  still unable to tell why she is in the hospital ?Overnight patient is afebrile, BP stable, on room air ?Creatinine slightly improving significantly high BUN at 135 ?Urine output 2.4 L with Foley catheter. ? ?Assessment and Plan: ?Principal Problem: ?  AKI (acute kidney injury) ([place]) ?Active Problems: ?  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  Pulmonary embolism ([place]) ?  Pure hypercholesterolemia ?  DVT (deep venous thrombosis) ([place]) ?  Hypothyroidism ?  Neuropathy ?  Left leg pain ?  Hypocalcemia ?  Acute metabolic encephalopathy ?  ?AKI with metabolic acidosis ?CKD stage IIIb baseline creatinine 1.3 with EGFR 40 ?Renal ultrasound  no acute finding.  CT abdomen pelvis no hydronephrosis.  Likely due to hypovolemia due to recent COVID infection and poor oral intake.  UA with negative hemoglobin nitrate LE,protein Nephrology following closely, creatinine slightly better now on bicarb IV fluids, and creatinine overall improving and having good urine output monitor intake output and renal function closely.  ?Recent Labs  ?Lab 11/20/21 ?1600 11/21/21 ?0320 11/22/21 ?8891  ?BUN 120* 117* 135*  ?CREATININE 8.75* 8.09* 7.26*  ? ?Intake/Output Summary (Last 24 hours) at 11/22/2021 1251 ?Last data filed at 11/22/2021 0600 ?Gross per 24 hour  ?Intake 240 ml  ?Output 2125 ml  ?Net -1885 ml  ?  ?Acute metabolic encephalopathy ?Patient has been confused and has been like this for several days as per the son.Likely multifactorial due to her recent COVID infection, now with AKI uremia.CT head no acute finding, nonfocal on exam. Continue supportive care for precaution, as needed medication for agitation, telemetry sitter. ? ?Left leg pain: Duplex of the lower extremities negative for DVT ? ?Hyperphosphatemia question renal etiology ?Hypocalcemia: Calcium improving at 7.4 with albumin low 2.8  ?Hypomagnesemia: on po mag ? 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 ?Hypothyroidism: TSH suppressed 0.2 but normal free T4, continue on Synthroid 125. ?Neuropathy:Continue Neurontin at bedtime-pharmacy to adjust renal dose ? ?DVT prophylaxis: Place TED hose Start: 11/20/21 1844 ?Code Status:   Code Status: Full Code ?Family Communication: plan of care discussed with patient at bedside.  Called and updated patient's son 4/26.  He tells me that his primary care doctor sent her to the hospital as she could need dialysis. ? ?  Patient status is: inpatient Level of care for ongoing management of AKI: Telemetry Cardiac  ?Remains  inpatient because: ongoing aki management ?Patient currently not stable ? ?Dispo: The patient is from: home ?           Anticipated disposition: TBD ? ?Mobility Assessment (last 72 hours)   ? ? Mobility Assessment   ? ? Row Name 11/21/21 1406 11/21/21 1200  ?  ?  ?  ? Does patient have an order for bedrest or is patient medically unstable No - Continue assessment No - Continue assessment     ? What is the highest level of mobility based on the progressive mobility assessment? Level 4 (Walks with assist in room) - Balance while marching in place and cannot step forward and back - Complete Level 5 (Walks with assist in room/hall) - Balance while stepping forward/back and can walk in room with assist - Complete     ? ?  ?  ? ?  ?  ? ?Objective: ?Vitals last 24 hrs: ?Vitals:  ? 11/21/21 2030 11/21/21 2313 11/22/21 0355 11/22/21 0745  ?BP:  (!) 113/99 123/65 116/78  ?Pulse: 94 (!) 106 91 75  ?Resp: 18 17 17 18  ?Temp: 98.2 ?F (36.8 ?C) 98.2 ?F (36.8 ?C) 98.4 ?F (36.9 ?C) 98 ?F (36.7 ?C)  ?TempSrc: Oral Oral Oral Oral  ?SpO2:  96% 94% 96%  ?Weight:      ? ?Weight change:  ? ?Physical Examination: ?General exam: AAOX2-3,older than stated age, weak appearing. ?HEENT:Oral mucosa moist, Ear/Nose WNL grossly, dentition normal. ?Respiratory system: bilaterally CLEAR,no use of accessory muscle ?Cardiovascular system: S1 & S2 +, No JVD,. ?Gastrointestinal system: Abdomen soft,NT,ND, BS+ ?Nervous System:Alert, awake, moving extremities and grossly nonfocal ?Extremities: edema mild,distal peripheral pulses palpable.  ?Skin: No rashes,no icterus. ?MSK: Normal muscle bulk,tone, power ? ? ?Medications reviewed: Scheduled Meds: ? apixaban  5 mg Oral BID  ? cholecalciferol  5,000 Units Oral q morning  ? gabapentin  600 mg Oral QHS  ? levothyroxine  125 mcg Oral Q0600  ? magnesium oxide  400 mg Oral BID  ? pantoprazole  40 mg Oral Daily  ? simvastatin  20 mg Oral q1800  ? ?Continuous Infusions: ? sodium bicarbonate 150 mEq in D5W  infusion    ? ? ?  ?Diet Order   ? ?       ?  Diet renal/carb modified with fluid restriction Diet-HS Snack? Nothing; Fluid restriction: 1200 mL Fluid; Room service appropriate? Yes; Fluid consistency: Thin  Diet effective now       ?  ? ?  ?  ? ?  ?  ? ?  ?  ?  ? ? ?Intake/Output Summary (Last 24 hours) at 11/22/2021 0802 ?Last data filed at 11/22/2021 0600 ?Gross per 24 hour  ?Intake 240 ml  ?Output 2425 ml  ?Net -2185 ml  ? ?Net IO Since Admission: -785 mL [11/22/21 0802]  ?Wt Readings from Last 3 Encounters:  ?11/21/21 84.7 kg  ?05/16/19 90.7 kg  ?09/24/18 95.9 kg  ?  ? ?Unresulted Labs (From admission, onward)  ? ?  Start     Ordered  ? 11/22/21 0500  CBC  Daily,   R     ? 11/21/21 0750  ? 11/22/21 0500  Comprehensive metabolic panel  Daily,   R     ? 11/21/21 0750  ? 11/21/21 1010  Protein electrophoresis, serum  Once,   R       ? 11/21/21 1010  ?   12/07/2021 0500  Calcium, ionized  Once,   R       ? 12-07-21 0500  ? ?  ?  ? ?  ?Data Reviewed: I have personally reviewed following labs and imaging studies ?CBC: ?Recent Labs  ?Lab 12-07-2021 ?0320 11/22/21 ?0109  ?WBC 7.8 8.1  ?HGB 12.9 12.3  ?HCT 39.9 37.3  ?MCV 82.8 83.4  ?PLT 193 167  ? ?Basic Metabolic Panel: ?Recent Labs  ?Lab 11/20/21 ?1600 07-Dec-2021 ?0320 11/22/21 ?3235  ?NA 137 142 140  ?K 4.0 4.6 4.7  ?CL 105 110 111  ?CO2 16* 20* 18*  ?GLUCOSE 164* 109* 127*  ?BUN 120* 117* 135*  ?CREATININE 8.75* 8.09* 7.26*  ?CALCIUM 6.3* 7.2* 7.4*  ?MG 1.5*  --  1.7  ?PHOS 5.7*  --   --   ? ?GFR: ?CrCl cannot be calculated (Unknown ideal weight.). ?Liver Function Tests: ?Recent Labs  ?Lab 12-07-2021 ?0320 11/22/21 ?5732  ?AST 15 13*  ?ALT 10 10  ?ALKPHOS 50 47  ?BILITOT 0.4 0.3  ?PROT 6.0* 5.7*  ?ALBUMIN 2.9* 2.8*  ? ?No results for input(s): LIPASE, AMYLASE in the last 168 hours. ?Recent Labs  ?Lab 11/20/21 ?1525  ?AMMONIA 35  ? ?Coagulation Profile: ?No results for input(s): INR, PROTIME in the last 168 hours. ?BNP (last 3 results) ?No results for input(s): PROBNP in the  last 8760 hours. ?HbA1C: ?No results for input(s): HGBA1C in the last 72 hours. ?CBG: ?No results for input(s): GLUCAP in the last 168 hours. ?Lipid Profile: ?No results for input(s): CHOL, HDL, LDLCALC, TRIG, CHOLHDL

## 2021-11-22 NOTE — Care Management Important Message (Signed)
Important Message ? ?Patient Details  ?Name: [name] [name] ?MRN: [id number] ?Date of Birth: 1950/10/06 ? ? ?Medicare Important Message Given:  N/A - LOS <3 / Initial given by admissions ? ? ? ? ?[name] ?11/22/2021, 2:06 PM ?

## 2021-11-22 NOTE — Progress Notes (Signed)
Mobility Specialist - Progress Note ? ? ? 11/22/21 1200  ?Mobility  ?Activity Ambulated with assistance in hallway;Stood at bedside;Dangled on edge of bed  ?Level of Assistance Standby assist, set-up cues, supervision of patient - no hands on  ?Assistive Device Front wheel walker  ?Distance Ambulated (ft) 120 ft  ?Activity Response Tolerated well  ?$Mobility charge 1 Mobility  ? ? ? ?During mobility:76  HR, 97% SpO2 ? ?Pt semi supine upon arrival using RA. Pt completes bed mobility with MinA to sit EOB, STS MinA -- vc for proper hand placements. Pt ambulates with supervision voicing feeling weak, but tolerated well. Pt returns to recliner with alarm set, needs in reach and RN notified. ? ?[name] [name] ?Mobility Specialist ?11/22/21, 12:28 PM ? ? ? ? ?

## 2021-11-22 NOTE — Progress Notes (Signed)
?[facility]  ?ROUNDING NOTE  ? ?Subjective:  ? ?Alert with delayed response ?No family at bedside to verify baseline mentation ?States she's tolerating meals.  ? ?Creatinine 7.26 ?Urine output 2.4L  ? ?Objective:  ?Vital signs in last 24 hours:  ?Temp:  [97.5 ?F (36.4 ?C)-98.4 ?F (36.9 ?C)] 98 ?F (36.7 ?C) (04/27 0745) ?Pulse Rate:  [58-106] 75 (04/27 0745) ?Resp:  [17-18] 18 (04/27 0745) ?BP: (113-131)/(65-99) 116/78 (04/27 0745) ?SpO2:  [94 %-97 %] 96 % (04/27 0745) ?Weight:  [84.7 kg] 84.7 kg (04/26 1357) ? ?Weight change:  ?Filed Weights  ? 11/21/21 1357  ?Weight: 84.7 kg  ? ? ?Intake/Output: ?I/O last 3 completed shifts: ?In: 1240 [P.O.:240; IV Piggyback:1000] ?Out: 3025 [[id number]] ?  ?Intake/Output this shift: ? No intake/output data recorded. ? ?Physical Exam: ?General: NAD  ?Head: Normocephalic, atraumatic. Moist oral mucosal membranes  ?Eyes: Anicteric  ?Lungs:  Clear to auscultation, normal effort  ?Heart: Regular rate and rhythm  ?Abdomen:  Soft, nontender, obese  ?Extremities:  trace peripheral edema.  ?Neurologic: Nonfocal, moving all four extremities  ?Skin: No lesions  ?Access: None  ? ? ?Basic Metabolic Panel: ?Recent Labs  ?Lab 11/20/21 ?1600 11/21/21 ?0320 11/22/21 ?5638  ?NA 137 142 140  ?K 4.0 4.6 4.7  ?CL 105 110 111  ?CO2 16* 20* 18*  ?GLUCOSE 164* 109* 127*  ?BUN 120* 117* 135*  ?CREATININE 8.75* 8.09* 7.26*  ?CALCIUM 6.3* 7.2* 7.4*  ?MG 1.5*  --  1.7  ?PHOS 5.7*  --   --   ? ? ?Liver Function Tests: ?Recent Labs  ?Lab 11/21/21 ?0320 11/22/21 ?7564  ?AST 15 13*  ?ALT 10 10  ?ALKPHOS 50 47  ?BILITOT 0.4 0.3  ?PROT 6.0* 5.7*  ?ALBUMIN 2.9* 2.8*  ? ?No results for input(s): LIPASE, AMYLASE in the last 168 hours. ?Recent Labs  ?Lab 11/20/21 ?1525  ?AMMONIA 35  ? ? ?CBC: ?Recent Labs  ?Lab 11/21/21 ?0320 11/22/21 ?3329  ?WBC 7.8 8.1  ?HGB 12.9 12.3  ?HCT 39.9 37.3  ?MCV 82.8 83.4  ?PLT 193 167  ? ? ?Cardiac Enzymes: ?No results for input(s): CKTOTAL, CKMB, CKMBINDEX, TROPONINI in  the last 168 hours. ? ?BNP: ?Invalid input(s): POCBNP ? ?CBG: ?No results for input(s): GLUCAP in the last 168 hours. ? ?Microbiology: ?Results for orders placed or performed during the hospital encounter of 11/20/21  ?Resp Panel by RT-PCR (Flu A&B, Covid) Nasopharyngeal Swab     Status: None  ? Collection Time: 11/20/21  3:28 PM  ? Specimen: Nasopharyngeal Swab; Nasopharyngeal(NP) swabs in vial transport medium  ?Result Value Ref Range Status  ? SARS Coronavirus 2 by RT PCR NEGATIVE NEGATIVE Final  ?  Comment: (NOTE) ?SARS-CoV-2 target nucleic acids are NOT DETECTED. ? ?The SARS-CoV-2 RNA is generally detectable in upper respiratory ?specimens during the acute phase of infection. The lowest ?concentration of SARS-CoV-2 viral copies this assay can detect is ?138 copies/mL. A negative result does not preclude SARS-Cov-2 ?infection and should not be used as the sole basis for treatment or ?other patient management decisions. A negative result may occur with  ?improper specimen collection/handling, submission of specimen other ?than nasopharyngeal swab, presence of viral mutation(s) within the ?areas targeted by this assay, and inadequate number of viral ?copies(<138 copies/mL). A negative result must be combined with ?clinical observations, patient history, and epidemiological ?information. The expected result is Negative. ? ?Fact Sheet for Patients:  ?EntrepreneurPulse.com.au ? ?Fact Sheet for Healthcare Providers:  ?IncredibleEmployment.be ? ?This test is no t  yet approved or cleared by the [place] and  ?has been authorized for detection and/or diagnosis of SARS-CoV-2 by ?FDA under an Emergency Use Authorization (EUA). This EUA will remain  ?in effect (meaning this test can be used) for the duration of the ?COVID-19 declaration under Section 564(b)(1) of the Act, 21 ?U.S.C.section 360bbb-3(b)(1), unless the authorization is terminated  ?or revoked sooner.  ? ? ?  ?  Influenza A by PCR NEGATIVE NEGATIVE Final  ? Influenza B by PCR NEGATIVE NEGATIVE Final  ?  Comment: (NOTE) ?The Xpert Xpress SARS-CoV-2/FLU/RSV plus assay is intended as an aid ?in the diagnosis of influenza from Nasopharyngeal swab specimens and ?should not be used as a sole basis for treatment. Nasal washings and ?aspirates are unacceptable for Xpert Xpress SARS-CoV-2/FLU/RSV ?testing. ? ?Fact Sheet for Patients: ?EntrepreneurPulse.com.au ? ?Fact Sheet for Healthcare Providers: ?IncredibleEmployment.be ? ?This test is not yet approved or cleared by the [place] FDA and ?has been authorized for detection and/or diagnosis of SARS-CoV-2 by ?FDA under an Emergency Use Authorization (EUA). This EUA will remain ?in effect (meaning this test can be used) for the duration of the ?COVID-19 declaration under Section 564(b)(1) of the Act, 21 U.S.C. ?section 360bbb-3(b)(1), unless the authorization is terminated or ?revoked. ? ?Performed at [facility], [place], ?[place] [address] ?  ? ? ?Coagulation Studies: ?No results for input(s): LABPROT, INR in the last 72 hours. ? ?Urinalysis: ?Recent Labs  ?  11/20/21 ?0321  ?COLORURINE COLORLESS*  ?LABSPEC 1.008  ?PHURINE 5.0  ?GLUCOSEU NEGATIVE  ?HGBUR NEGATIVE  ?BILIRUBINUR NEGATIVE  ?KETONESUR NEGATIVE  ?PROTEINUR NEGATIVE  ?NITRITE NEGATIVE  ?LEUKOCYTESUR NEGATIVE  ?  ? ? ?Imaging: ?CT ABDOMEN PELVIS WO CONTRAST ? ?Result Date: 11/20/2021 ?CLINICAL DATA:  Kidney failure, acute renal failure EXAM: CT ABDOMEN AND PELVIS WITHOUT CONTRAST TECHNIQUE: Multidetector CT imaging of the abdomen and pelvis was performed following the standard protocol without IV contrast. RADIATION DOSE REDUCTION: This exam was performed according to the departmental dose-optimization program which includes automated exposure control, adjustment of the mA and/or kV according to patient size and/or use of iterative reconstruction technique.  COMPARISON:  CT 08/22/2021, CT 08/18/2018. FINDINGS: Lower chest: Prior right mastectomy. Similar scarring in the right middle lobe. Unchanged small nodules in the periphery of the right lower lobe largest measuring 4 mm dating back to January 2020 (series 6, image 3). These are benign. No acute findings seen in the lower chest. Hepatobiliary: Unchanged left hepatic cyst. No other parenchymal abnormality. Cholelithiasis. No cholecystitis. Pancreas: Fatty atrophy.  No ductal dilation. Spleen: Normal in size without focal abnormality. 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 No hydronephrosis or nephrolithiasis. Moderate bladder distension. Stomach/Bowel: Small hiatal hernia. The stomach is otherwise unremarkable. There is no evidence of bowel obstruction.Prior appendectomy. There is no acute inflammatory process involving the bowel. Colonic diverticulosis. No diverticulitis. Vascular/Lymphatic: There is an IVC filter in place with multiple legs protruding through the IVC lumen. Minimal aortoiliac atherosclerotic calcifications. No AAA. No lymphadenopathy. Reproductive: Unremarkable. Other: Diastasis recti and small umbilical hernia containing a nonobstructed loop of small bowel. No ascites. No free air. Musculoskeletal: No acute osseous abnormality. No suspicious lytic or blastic lesions. Multilevel degenerative changes of the spine. 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 IMPRESSION: No acute findings in the abdomen or pelvis. No hydronephrosis or nephroureterolithiasis. Small umbilical hernia containing a nonobstructed loop of small bowel. Cholelithiasis.  Colonic diverticulosis. Electronically Signed   By: Ileene Patrick.D.  On: 11/20/2021 16:46  ? ?DG Chest 2 View ? ?Result Date: 11/20/2021 ?CLINICAL DATA:  Fatigue, weakness and dizziness. Recent COVID diagnosis. EXAM: CHEST - 2 VIEW COMPARISON:  Chest x-ray 08/18/2018 and chest CT 08/22/2021 FINDINGS: The cardiac silhouette,  mediastinal and hilar contours are within normal limits given the AP projection. Stable postoperative changes involving the right upper hemithorax and status post right mastectomy. The lungs are clear of an acut

## 2021-11-22 NOTE — Plan of Care (Signed)

## 2021-11-22 NOTE — Progress Notes (Signed)
Mobility Specialist - Progress Note ? ? ? ? 11/22/21 1400  ?Mobility  ?Activity Ambulated with assistance to bathroom;Stood at bedside;Dangled on edge of bed;Transferred to/from [facility]  ?Level of Assistance Standby assist, set-up cues, supervision of patient - no hands on  ?Assistive Device Front wheel walker  ?Distance Ambulated (ft) 20 ft  ?Activity Response Tolerated well  ?$Mobility charge 1 Mobility  ? ? ? ?Pt sitting in recliner upon arrival using RA with family at bedside.  Pt completes STS with MinA and ambulates to bathroom with SUPERVISION for BM. Pt returns to recliner with needs in reach, chair alarm set and family at bedside. ? ?[name] [name] ?Mobility Specialist ?11/22/21, 2:51 PM ? ?

## 2021-11-23 DIAGNOSIS — N179 Acute kidney failure, unspecified: Secondary | ICD-10-CM | POA: Diagnosis not present

## 2021-11-23 LAB — CBC
HCT: 35.5 % — ABNORMAL LOW (ref 36.0–46.0)
Hemoglobin: 11.8 g/dL — ABNORMAL LOW (ref 12.0–15.0)
MCH: 27.3 pg (ref 26.0–34.0)
MCHC: 33.2 g/dL (ref 30.0–36.0)
MCV: 82.2 fL (ref 80.0–100.0)
Platelets: 170 10*3/uL (ref 150–400)
RBC: 4.32 MIL/uL (ref 3.87–5.11)
RDW: 14.2 % (ref 11.5–15.5)
WBC: 7.7 10*3/uL (ref 4.0–10.5)
nRBC: 0 % (ref 0.0–0.2)

## 2021-11-23 LAB — BASIC METABOLIC PANEL
Anion gap: 12 (ref 5–15)
BUN: 120 mg/dL — ABNORMAL HIGH (ref 8–23)
CO2: 25 mmol/L (ref 22–32)
Calcium: 7.2 mg/dL — ABNORMAL LOW (ref 8.9–10.3)
Chloride: 105 mmol/L (ref 98–111)
Creatinine, Ser: 6.23 mg/dL — ABNORMAL HIGH (ref 0.44–1.00)
GFR, Estimated: 7 mL/min — ABNORMAL LOW (ref >60)
Glucose, Bld: 116 mg/dL — ABNORMAL HIGH (ref 70–99)
Potassium: 4.6 mmol/L (ref 3.5–5.1)
Sodium: 142 mmol/L (ref 135–145)

## 2021-11-23 LAB — PROTEIN ELECTROPHORESIS, SERUM
A/G Ratio: 1 (ref 0.7–1.7)
Albumin ELP: 2.6 g/dL — ABNORMAL LOW (ref 2.9–4.4)
Alpha-1-Globulin: 0.2 g/dL (ref 0.0–0.4)
Alpha-2-Globulin: 0.9 g/dL (ref 0.4–1.0)
Beta Globulin: 0.8 g/dL (ref 0.7–1.3)
Gamma Globulin: 0.7 g/dL (ref 0.4–1.8)
Globulin, Total: 2.6 g/dL (ref 2.2–3.9)
Total Protein ELP: 5.2 g/dL — ABNORMAL LOW (ref 6.0–8.5)

## 2021-11-23 MED ORDER — SODIUM CHLORIDE 0.9 % IV BOLUS
1000.0000 mL | Freq: Once | INTRAVENOUS | Status: AC
  Administered 2021-11-23: 1000 mL via INTRAVENOUS

## 2021-11-23 NOTE — Progress Notes (Signed)
?PROGRESS NOTE ?[name] [name]  XIP:[id number] DOB: [birth year] DOA: 11/20/2021 ?PCP: Rusty Aus, MD  ? 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 ? ?Initial vitals in the ED: T- 97.6, respiration rate of 16, heart rate 72, blood pressure 125/77, SPO2 of 94% on room air. ?LABS-Sodium 137, k 4.0, bicarb 16, BUN of 120, creatinine of 8.75, nonfasting blood glucose 164. ?CBC performed at [facility] clinic showed WBC of 8.1, hemoglobin 13.7, platelets of 208.High-sensitivity troponin was 12.  Phosphorus was 5.7.COVID/influenza A/influenza B PCR were negative.  CT abdomen pelvis CT head, UA and chest x-ray unremarkable. ?In ED got calcium gluconate 1 g, LR 2 L bolus, magnesium 2 g IV. ?She was admitted for AKI and acute metabolic encephalopathy. ? ?  ?Subjective: ?Seen and examined this morning, patient is much more alert awake oriented. ?Was unable to tell me what is wrong with her STILL ?Overnight afebrile BP stable, labs with improving BUN and creatinine and stable CBC ?urine output 2.6 L with Foley catheter. ? ?Assessment and Plan: ?Principal Problem: ?  AKI (acute kidney injury) ([place]) ?Active Problems: ?  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  Pulmonary embolism ([place]) ?  Pure hypercholesterolemia ?  DVT (deep venous thrombosis) ([place]) ?  Hypothyroidism ?  Neuropathy ?  Left leg pain ?  Hypocalcemia ?  Acute metabolic encephalopathy ?  ?AKI with metabolic acidosis ?CKD stage IIIb baseline creatinine 1.3 with EGFR 40 ?Renal ultrasound no acute finding.  CT abdomen pelvis no hydronephrosis.   AKI due to hypovolemia due to recent COVID infection and poor oral intake.  Patient having good urine output and BUN/creatinine continues to improve.  Nephrology following on bicarb drip.  Monitor closely.  Continue Foley catheter for now-voiding trial prior to discharge ?Recent Labs  ?Lab 11/20/21 ?1600 11/21/21 ?0320 11/22/21 ?6734 11/23/21 ?0615  ?BUN 120* 117* 135* 120*  ?CREATININE 8.75* 8.09* 7.26* 6.23*  ? ?Intake/Output Summary (Last 24 hours) at 11/23/2021 1125 ?Last data filed at 11/23/2021 1025 ?Gross per 24 hour  ?Intake 1647.29 ml  ?Output 2625 ml  ?Net -977.71 ml  ?  ?Acute metabolic encephalopathy ?Patient has been confused and has been like this for several days as per the son.Likely multifactorial due to her recent COVID infection, now with AKI uremia.CT head no acute finding, nonfocal on exam.  Mental status significantly improved, continue continue supportive care for precaution, as needed medication for agitation, telemetry sitter. ? ?Left leg pain: Duplex of the lower extremities negative for DVT. ? ?Hyperphosphatemia question renal etiology.  Monitor ?Hypocalcemia: Calcium improving at 7.2 with albumin low 2.8  ?Hypomagnesemia: Continue  po magx 3 days ? 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 ?Hypothyroidism: TSH suppressed 0.2 but normal free T4, continue on Synthroid 125. ?Neuropathy:Continue Neurontin at bedtime-pharmacy to adjust renal dose ? ?DVT prophylaxis: Place TED hose Start: 11/20/21 1844 ?Code Status:   Code Status: Full Code ?Family Communication: plan of care discussed with patient at bedside.  Called and updated patient's son 4/26.  He tells me that his primary care doctor sent her to the hospital as she could  need dialysis.  Patient's family were updated today by his nursing staff. ? ?Patient status is: inpatient Level of care for ongoing  management of AKI: Telemetry Cardiac  ?Remains inpatient because: ongoing aki management ?Patient currently not stable ? ?Dispo: The patient is from: home ?           Anticipated disposition: Likely skilled nursing facility will obtain PT OT evaluation today ? ?Mobility Assessment (last 72 hours)   ? ? Mobility Assessment   ? ? [place] Name 11/23/21 1045 11/22/21 1500 11/21/21 1406 11/21/21 1200  ?  ? Does patient have an order for bedrest or is patient medically unstable -- No - Continue assessment No - Continue assessment No - Continue assessment   ? What is the highest level of mobility based on the progressive mobility assessment? Level 5 (Walks with assist in room/hall) - Balance while stepping forward/back and can walk in room with assist - Complete Level 5 (Walks with assist in room/hall) - Balance while stepping forward/back and can walk in room with assist - Complete Level 4 (Walks with assist in room) - Balance while marching in place and cannot step forward and back - Complete Level 5 (Walks with assist in room/hall) - Balance while stepping forward/back and can walk in room with assist - Complete   ? ?  ?  ? ?  ?  ? ?Objective: ?Vitals last 24 hrs: ?Vitals:  ? 11/22/21 1235 11/22/21 2016 11/23/21 0312 11/23/21 0911  ?BP: 112/60 107/71 118/65 (!) 90/59  ?Pulse: 66 73 83 68  ?Resp: $Remov'17 18 18 16  'HDrQau$ ?Temp: (!) 97.5 ?F (36.4 ?C) 98.7 ?F (37.1 ?C) 97.9 ?F (36.6 ?C) 98.2 ?F (36.8 ?C)  ?TempSrc: Oral Oral Oral   ?SpO2: 97% 95% 94% 95%  ?Weight:      ? ?Weight change:  ? ?Physical Examination: ?General exam: AA0X3,older than stated age, weak appearing. ?HEENT:Oral mucosa moist, Ear/Nose WNL grossly, dentition normal. ?Respiratory system: bilaterally CLEAR,no use of accessory muscle ?Cardiovascular system: S1 & S2 +, No JVD,. ?Gastrointestinal system: Abdomen soft,NT,ND, BS+ ?Nervous System:Alert, awake, moving extremities and grossly nonfocal ?Extremities: edema neg,distal peripheral pulses palpable.  ?Skin: No  rashes,no icterus. ?MSK: Normal muscle bulk,tone, power ? ? ? ?Medications reviewed: Scheduled Meds: ? apixaban  5 mg Oral BID  ? Chlorhexidine Gluconate Cloth  6 each Topical Q0600  ? cholecalciferol  5,000 Units Oral q morning  ? gabapentin  600 mg Oral QHS  ? levothyroxine  125 mcg Oral Q0600  ? magnesium oxide  400 mg Oral BID  ? pantoprazole  40 mg Oral Daily  ? simvastatin  20 mg Oral q1800  ? ?Continuous Infusions: ? sodium bicarbonate 150 mEq in D5W infusion 75 mL/hr at 11/23/21 0858  ? ? ?  ?Diet Order   ? ?       ?  Diet renal/carb modified with fluid restriction Diet-HS Snack? Nothing; Fluid restriction: 1200 mL Fluid; Room service appropriate? Yes; Fluid consistency: Thin  Diet effective now       ?  ? ?  ?  ? ?  ?  ? ?  ?  ?  ? ? ?Intake/Output Summary (Last 24 hours) at 11/23/2021 1125 ?Last data filed at 11/23/2021 1025 ?Gross per 24 hour  ?Intake 1647.29 ml  ?Output 2625 ml  ?Net -977.71 ml  ? ?Net IO Since Admission: -1,762.71 mL [11/23/21 1125]  ?Wt Readings from Last 3 Encounters:  ?11/21/21 84.7 kg  ?05/16/19 90.7 kg  ?09/24/18 95.9 kg  ?  ? ?  Unresulted Labs (From admission, onward)  ? ?  Start     Ordered  ? 11/23/21 1007  Basic metabolic panel  Daily,   R     ? 11/22/21 0808  ? 11/22/21 0500  CBC  Daily,   R     ? 2021-12-03 0750  ? 12/03/21 1010  Protein electrophoresis, serum  Once,   R       ? 12/03/2021 1010  ? ?  ?  ? ?  ?Data Reviewed: I have personally reviewed following labs and imaging studies ?CBC: ?Recent Labs  ?Lab Dec 03, 2021 ?0320 11/22/21 ?1219 11/23/21 ?0615  ?WBC 7.8 8.1 7.7  ?HGB 12.9 12.3 11.8*  ?HCT 39.9 37.3 35.5*  ?MCV 82.8 83.4 82.2  ?PLT 193 167 170  ? ?Basic Metabolic Panel: ?Recent Labs  ?Lab 11/20/21 ?1600 2021-12-03 ?0320 11/22/21 ?7588 11/23/21 ?0615  ?NA 137 142 140 142  ?K 4.0 4.6 4.7 4.6  ?CL 105 110 111 105  ?CO2 16* 20* 18* 25  ?GLUCOSE 164* 109* 127* 116*  ?BUN 120* 117* 135* 120*  ?CREATININE 8.75* 8.09* 7.26* 6.23*  ?CALCIUM 6.3* 7.2* 7.4* 7.2*  ?MG 1.5*  --  1.7  --    ?PHOS 5.7*  --   --   --   ? ?GFR: ?CrCl cannot be calculated (Unknown ideal weight.). ?Liver Function Tests: ?Recent Labs  ?Lab 12-03-21 ?0320 11/22/21 ?3254  ?AST 15 13*  ?ALT 10 10  ?ALKPHOS 50 47  ?BILITOT 0.4

## 2021-11-23 NOTE — Progress Notes (Signed)
?[facility]  ?ROUNDING NOTE  ? ?Subjective:  ? ?Patient resting quietly ?Alert and oriented ?Stronger voice ?States she feels better today and doesn't understand why her kidney function went down. Remains unable to verify if she was eating and drinking well prior to admission.  ? ?Creatinine 6.23 ?Urine output 2.6L  ? ?Objective:  ?Vital signs in last 24 hours:  ?Temp:  [97.5 ?F (36.4 ?C)-98.7 ?F (37.1 ?C)] 98.2 ?F (36.8 ?C) (04/28 2694) ?Pulse Rate:  [66-83] 68 (04/28 0911) ?Resp:  [16-18] 16 (04/28 0911) ?BP: (90-118)/(59-71) 90/59 (04/28 0911) ?SpO2:  [94 %-97 %] 95 % (04/28 0911) ? ?Weight change:  ?Filed Weights  ? 11/21/21 1357  ?Weight: 84.7 kg  ? ? ?Intake/Output: ?I/O last 3 completed shifts: ?In: 1407.3 [I.V.:1407.3] ?Out: 4750 [WNIOE:7035] ?  ?Intake/Output this shift: ? Total I/O ?In: 240 [P.O.:240] ?Out: -  ? ?Physical Exam: ?General: NAD  ?Head: Normocephalic, atraumatic. Moist oral mucosal membranes  ?Eyes: Anicteric  ?Lungs:  Clear to auscultation, normal effort  ?Heart: Regular rate and rhythm  ?Abdomen:  Soft, nontender, obese  ?Extremities:  No peripheral edema.  ?Neurologic: Alert, moving all four extremities  ?Skin: No lesions  ?Access: None  ? ? ?Basic Metabolic Panel: ?Recent Labs  ?Lab 11/20/21 ?1600 11/21/21 ?0320 11/22/21 ?0093 11/23/21 ?0615  ?NA 137 142 140 142  ?K 4.0 4.6 4.7 4.6  ?CL 105 110 111 105  ?CO2 16* 20* 18* 25  ?GLUCOSE 164* 109* 127* 116*  ?BUN 120* 117* 135* 120*  ?CREATININE 8.75* 8.09* 7.26* 6.23*  ?CALCIUM 6.3* 7.2* 7.4* 7.2*  ?MG 1.5*  --  1.7  --   ?PHOS 5.7*  --   --   --   ? ? ? ?Liver Function Tests: ?Recent Labs  ?Lab 11/21/21 ?0320 11/22/21 ?8182  ?AST 15 13*  ?ALT 10 10  ?ALKPHOS 50 47  ?BILITOT 0.4 0.3  ?PROT 6.0* 5.7*  ?ALBUMIN 2.9* 2.8*  ? ? ?No results for input(s): LIPASE, AMYLASE in the last 168 hours. ?Recent Labs  ?Lab 11/20/21 ?1525  ?AMMONIA 35  ? ? ? ?CBC: ?Recent Labs  ?Lab 11/21/21 ?0320 11/22/21 ?9937 11/23/21 ?0615  ?WBC 7.8 8.1 7.7   ?HGB 12.9 12.3 11.8*  ?HCT 39.9 37.3 35.5*  ?MCV 82.8 83.4 82.2  ?PLT 193 167 170  ? ? ? ?Cardiac Enzymes: ?No results for input(s): CKTOTAL, CKMB, CKMBINDEX, TROPONINI in the last 168 hours. ? ?BNP: ?Invalid input(s): POCBNP ? ?CBG: ?No results for input(s): GLUCAP in the last 168 hours. ? ?Microbiology: ?Results for orders placed or performed during the hospital encounter of 11/20/21  ?Resp Panel by RT-PCR (Flu A&B, Covid) Nasopharyngeal Swab     Status: None  ? Collection Time: 11/20/21  3:28 PM  ? Specimen: Nasopharyngeal Swab; Nasopharyngeal(NP) swabs in vial transport medium  ?Result Value Ref Range Status  ? SARS Coronavirus 2 by RT PCR NEGATIVE NEGATIVE Final  ?  Comment: (NOTE) ?SARS-CoV-2 target nucleic acids are NOT DETECTED. ? ?The SARS-CoV-2 RNA is generally detectable in upper respiratory ?specimens during the acute phase of infection. The lowest ?concentration of SARS-CoV-2 viral copies this assay can detect is ?138 copies/mL. A negative result does not preclude SARS-Cov-2 ?infection and should not be used as the sole basis for treatment or ?other patient management decisions. A negative result may occur with  ?improper specimen collection/handling, submission of specimen other ?than nasopharyngeal swab, presence of viral mutation(s) within the ?areas targeted by this assay, and inadequate number of viral ?copies(<138  copies/mL). A negative result must be combined with ?clinical observations, patient history, and epidemiological ?information. The expected result is Negative. ? ?Fact Sheet for Patients:  ?EntrepreneurPulse.com.au ? ?Fact Sheet for Healthcare Providers:  ?IncredibleEmployment.be ? ?This test is no t yet approved or cleared by the [place] FDA and  ?has been authorized for detection and/or diagnosis of SARS-CoV-2 by ?FDA under an Emergency Use Authorization (EUA). This EUA will remain  ?in effect (meaning this test can be used) for the duration of  the ?COVID-19 declaration under Section 564(b)(1) of the Act, 21 ?U.S.C.section 360bbb-3(b)(1), unless the authorization is terminated  ?or revoked sooner.  ? ? ?  ? Influenza A by PCR NEGATIVE NEGATIVE Final  ? Influenza B by PCR NEGATIVE NEGATIVE Final  ?  Comment: (NOTE) ?The Xpert Xpress SARS-CoV-2/FLU/RSV plus assay is intended as an aid ?in the diagnosis of influenza from Nasopharyngeal swab specimens and ?should not be used as a sole basis for treatment. Nasal washings and ?aspirates are unacceptable for Xpert Xpress SARS-CoV-2/FLU/RSV ?testing. ? ?Fact Sheet for Patients: ?EntrepreneurPulse.com.au ? ?Fact Sheet for Healthcare Providers: ?IncredibleEmployment.be ? ?This test is not yet approved or cleared by the [place] FDA and ?has been authorized for detection and/or diagnosis of SARS-CoV-2 by ?FDA under an Emergency Use Authorization (EUA). This EUA will remain ?in effect (meaning this test can be used) for the duration of the ?COVID-19 declaration under Section 564(b)(1) of the Act, 21 U.S.C. ?section 360bbb-3(b)(1), unless the authorization is terminated or ?revoked. ? ?Performed at [facility], [place], ?[place] [address] ?  ? ? ?Coagulation Studies: ?No results for input(s): LABPROT, INR in the last 72 hours. ? ?Urinalysis: ?No results for input(s): COLORURINE, LABSPEC, [place], GLUCOSEU, HGBUR, BILIRUBINUR, KETONESUR, PROTEINUR, UROBILINOGEN, NITRITE, LEUKOCYTESUR in the last 72 hours. ? ?Invalid input(s): APPERANCEUR ?  ? ? ?Imaging: ?No results found. ? ? ?Medications:  ? ? sodium bicarbonate 150 mEq in D5W infusion 75 mL/hr at 11/23/21 0858  ? ? apixaban  5 mg Oral BID  ? Chlorhexidine Gluconate Cloth  6 each Topical Q0600  ? cholecalciferol  5,000 Units Oral q morning  ? gabapentin  600 mg Oral QHS  ? levothyroxine  125 mcg Oral Q0600  ? magnesium oxide  400 mg Oral BID  ? pantoprazole  40 mg Oral Daily  ? simvastatin  20 mg Oral  q1800  ? ?acetaminophen **OR** acetaminophen, fluticasone, LORazepam, LORazepam, ondansetron **OR** ondansetron (ZOFRAN) IV, polyethylene glycol ? ?Assessment/ Plan:  ?[name] [name] is a 71 y.o.  female with medical concerns including neuropathy, GERD, hyperlipidemia radiation-induced angiosarcoma, who was admitted to [facility] on 11/20/2021 for Hypocalcemia [E83.51] ?Hypomagnesemia [E83.42] ?High anion gap metabolic acidosis [J85.63] ?AKI (acute kidney injury) ([place]) [N17.9] ?Chest pain, unspecified type [R07.9] ? ? ?Acute kidney injury on chronic kidney disease stage IIIb.  Baseline creatinine appears to be 1.3 with GFR 40 on 08/22/21.  Renal ultrasound and CT renal stone both negative for obstruction.  No IV contrast exposure.  Based on history, AKI appears likely due to hypovolemia due to malaise from COVID-19 infection. No acute indication for dialysis at time, but will monitor closely.  ?Renal function continues to slowly improve with acceptable urine output. Continue IVF at decreased rate and encourage oral intake. Continue to avoid nephrotoxic agents and therapies.  ? ?Lab Results  ?Component Value Date  ? CREATININE 6.23 (H) 11/23/2021  ? CREATININE 7.26 (H) 11/22/2021  ? CREATININE 8.09 (H) 11/21/2021  ? ? ?Intake/Output  Summary (Last 24 hours) at 11/23/2021 1040 ?Last data filed at 11/23/2021 1025 ?Gross per 24 hour  ?Intake 1647.29 ml  ?Output 2625 ml  ?Net -977.71 ml  ? ? ? ?2.   Acute metabolic acidosis.  Bicarb on admission 16.  Decrease rate of Sodium bicarb 75 ml/hr ? ?3. Anemia of chronic kidney disease ?Lab Results  ?Component Value Date  ? HGB 11.8 (L) 11/23/2021  ?  ?Hgb within desired range ? ? LOS: 2 ?[place] ?4/28/202310:40 AM ?  ?

## 2021-11-23 NOTE — Evaluation (Signed)
Physical Therapy Evaluation ?Patient Details ?Name: [name] [name] ?MRN: [id number] ?DOB: 06/26/1951 ?Today's Date: 11/23/2021 ? ?History of Present Illness ? Pt is a 71 y/o F admitted on 11/20/21 after presenting from [facility] for AKI.  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 2020, LLE DVT, anxiety, invasive papillary adenocarcinoma s/p lobectomy, radation, chemo  ?Clinical Impression ? Pt seen for PT evaluation with pt received with spouse, Herbie [place], in room. Pt oriented to self & month only but Robert report's pt's current baseline is comparable to her baseline cognition. Prior to admission pt was ambulatory without AD & without falls. On this date, pt is able to ambulate 1 lap around nurses station with slow guarded gait without AD with CGA<>MIN assist before endorsing fatigue. Educated Robert on pt's need for assistance for all mobility, which he states he can provide.   Will continue to follow pt acutely to address balance, endurance, and gait with LRAD. ? ?   ? ?Recommendations for follow up therapy are one component of a multi-disciplinary discharge planning process, led by the attending physician.  Recommendations may be updated based on patient status, additional functional criteria and insurance authorization. ? ?Follow Up Recommendations Home health PT ? ?  ?Assistance Recommended at Discharge Frequent or constant Supervision/Assistance  ?Patient can return home with the following ? A little help with walking and/or transfers;A little help with bathing/dressing/bathroom;Assistance with cooking/housework;Direct supervision/assist for financial management;Assist for transportation;Direct supervision/assist for medications management ? ?  ?Equipment Recommendations None recommended by PT  ?Recommendations for Other Services ?    ?  ?Functional Status Assessment Patient has had a recent  decline in their functional status and demonstrates the ability to make significant improvements in function in a reasonable and predictable amount of time.  ? ?  ?Precautions / Restrictions Precautions ?Precautions: Fall ?Restrictions ?Weight Bearing Restrictions: No  ? ?  ? ?Mobility ? Bed Mobility ?Overal bed mobility: Needs Assistance ?Bed Mobility: Supine to Sit ?  ?  ?Supine to sit: Supervision, HOB elevated ?  ?  ?General bed mobility comments: extra time ?  ? ?Transfers ?Overall transfer level: Needs assistance ?Equipment used: None ?Transfers: Sit to/from Stand ?Sit to Stand: Min guard ?  ?  ?  ?  ?  ?General transfer comment: cuing for hand placement ?  ? ?Ambulation/Gait ?Ambulation/Gait assistance: Min assist, Min guard ?Gait Distance (Feet): 200 Feet ?Assistive device: None ?Gait Pattern/deviations: Decreased step length - right, Decreased step length - left, Decreased stride length ?Gait velocity: decreased ?  ?  ?General Gait Details: BUE up in guarded manner, pt with intermittent lateral sway but able to correct with CGA<>Min assist. ? ?Stairs ?  ?  ?  ?  ?  ? ?Wheelchair Mobility ?  ? ?Modified Rankin (Stroke Patients Only) ?  ? ?  ? ?Balance Overall balance assessment: Needs assistance ?Sitting-balance support: Feet supported, Bilateral upper extremity supported ?Sitting balance-Leahy Scale: Good ?  ?  ?Standing balance support: During functional activity, No upper extremity supported ?Standing balance-Leahy Scale: Poor ?  ?  ?  ?  ?  ?  ?  ?  ?  ?  ?  ?  ?   ? ? ? ?Pertinent Vitals/Pain Pain Assessment ?Pain Assessment: No/denies pain  ? ? ?Home Living Family/patient expects to be discharged to:: Private residence ?Living Arrangements: Spouse/significant other ?Available Help at Discharge: Family;Available 24 hours/day ?Type of Home: House ?Home  Access: Level entry ?  ?  ?  ?Home Layout: One level ?Home Equipment: Kasandra Knudsen - single point ?   ?  ?Prior Function Prior Level of Function :  Independent/Modified Independent;Needs assist ?  ?  ?  ?Physical Assist : ADLs (physical) ?  ?ADLs (physical):  (Pt's husband performs all cooking, cleaning, driving) ?Mobility Comments: Pt ambulates around house without AD, pt & spouse report PRN use of cane or furniture walking but typically no AD, denies falls. ?  ?  ? ? ?Hand Dominance  ?   ? ?  ?Extremity/Trunk Assessment  ? Upper Extremity Assessment ?Upper Extremity Assessment: Difficult to assess due to impaired cognition ?  ? ?Lower Extremity Assessment ?Lower Extremity Assessment: Generalized weakness;Overall [facility] for tasks assessed ?  ? ?Cervical / Trunk Assessment ?Cervical / Trunk Assessment: Normal  ?Communication  ? Communication: No difficulties  ?Cognition Arousal/Alertness: Awake/alert ?Behavior During Therapy: [facility] for tasks assessed/performed ?Overall Cognitive Status: History of cognitive impairments - at baseline ?  ?  ?  ?  ?  ?  ?  ?  ?  ?  ?  ?  ?  ?  ?  ?  ?General Comments: Pt oriented to self & birthday but unable to recall age, oriented to month but not year. Pt's spouse reports pt's current cognition is comparable to baseline. ?  ?  ? ?  ?General Comments General comments (skin integrity, edema, etc.): Offered gait belt but pt's spouse Herbie [place]) declined; educated them on need for assistance for all mobility & need for extended practice with RW before pt is able to safely use it on her own as learning may be limited due to current impaired cognition. ? ?  ?Exercises    ? ?Assessment/Plan  ?  ?PT Assessment Patient needs continued PT services  ?PT Problem List Decreased strength;Decreased activity tolerance;Decreased balance;Decreased safety awareness;Decreased mobility;Decreased knowledge of use of DME ? ?   ?  ?PT Treatment Interventions DME instruction;Therapeutic exercise;Gait training;Balance training;Neuromuscular re-education;Functional mobility training;Cognitive remediation;Therapeutic activities;Patient/family education   ? ?PT  Goals (Current goals can be found in the Care Plan section)  ?Acute Rehab PT Goals ?Patient Stated Goal: get better ?PT Goal Formulation: With patient/family ?Time For Goal Achievement: 12/07/21 ?Potential to Achieve Goals: Good ? ?  ?Frequency Min 2X/week ?  ? ? ?Co-evaluation   ?  ?  ?  ?  ? ? ?  ?AM-PAC PT "6 Clicks" Mobility  ?Outcome Measure Help needed turning from your back to your side while in a flat bed without using bedrails?: None ?Help needed moving from lying on your back to sitting on the side of a flat bed without using bedrails?: None ?Help needed moving to and from a bed to a chair (including a wheelchair)?: A Little ?Help needed standing up from a chair using your arms (e.g., wheelchair or bedside chair)?: A Little ?Help needed to walk in hospital room?: A Little ?Help needed climbing 3-5 steps with a railing? : A Little ?6 Click Score: 20 ? ?  ?End of Session   ?Activity Tolerance: Patient tolerated treatment well ?Patient left: in chair;with chair alarm set;with call bell/phone within reach;with family/visitor present ?Nurse Communication: Mobility status ?PT Visit Diagnosis: Muscle weakness (generalized) (M62.81);Unsteadiness on feet (R26.81) ?  ? ?Time: 6433-2951 ?PT Time Calculation (min) (ACUTE ONLY): 15 min ? ? ?Charges:   PT Evaluation ?$PT Eval Low Complexity: 1 Low ?  ?  ?   ? ? ?Lavone Nian, PT, DPT ?11/23/21, 10:48 AM ? ? ?  Waunita Schooner ?11/23/2021, 10:46 AM ? ?

## 2021-11-23 NOTE — Evaluation (Signed)
Occupational Therapy Evaluation ?Patient Details ?Name: [name] [name] ?MRN: [id number] ?DOB: 10-Dec-1950 ?Today's Date: 11/23/2021 ? ? ?History of Present Illness Pt is a 71 y/o F admitted on 11/20/21 after presenting from [facility] for AKI.  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 2020, LLE DVT, anxiety, invasive papillary adenocarcinoma s/p lobectomy, radation, chemo  ? ?Clinical Impression ?  ?Patient presenting with decreased Ind in self care, balance, functional mobility/transfers, endurance, and safety awareness. Patient reports living with husband and being able to complete self care tasks independently at baseline. She ambulates without use of RW but does endorse furniture walking.  Patient currently functioning at supervision - min guard with use of RW for functional mobility, toileting, clothing management, and functional transfers. Patient will benefit from acute OT to increase overall independence in the areas of ADLs, functional mobility, and safety awareness in order to safely discharge home with husband.  ?   ? ?Recommendations for follow up therapy are one component of a multi-disciplinary discharge planning process, led by the attending physician.  Recommendations may be updated based on patient status, additional functional criteria and insurance authorization.  ? ?Follow Up Recommendations ? Home health OT  ?  ?Assistance Recommended at Discharge Intermittent Supervision/Assistance  ?Patient can return home with the following A little help with walking and/or transfers;A little help with bathing/dressing/bathroom;Assist for transportation;Help with stairs or ramp for entrance;Assistance with cooking/housework;Direct supervision/assist for financial management;Direct supervision/assist for medications management ? ?  ?Functional Status Assessment ? Patient has had a recent decline  in their functional status and demonstrates the ability to make significant improvements in function in a reasonable and predictable amount of time.  ?Equipment Recommendations ? Other (comment) (RW)  ?  ?   ?Precautions / Restrictions Precautions ?Precautions: Fall  ? ?  ? ?Mobility Bed Mobility ?  ?  ?  ?  ?  ?  ?  ?General bed mobility comments: seated in recliner chair at beginning and end of session ?  ? ?Transfers ?Overall transfer level: Needs assistance ?Equipment used: Rolling walker (2 wheels) ?Transfers: Sit to/from Stand ?Sit to Stand: Supervision ?  ?  ?  ?  ?  ?General transfer comment: cuing for technique and hand placement ?  ? ?  ?Balance Overall balance assessment: Needs assistance ?Sitting-balance support: Feet supported, Bilateral upper extremity supported ?Sitting balance-Leahy Scale: Good ?  ?  ?Standing balance support: During functional activity, No upper extremity supported ?Standing balance-Leahy Scale: Fair ?  ?  ?  ?  ?  ?  ?  ?  ?  ?  ?  ?  ?   ? ?ADL either performed or assessed with clinical judgement  ? ?ADL Overall ADL's : Needs assistance/impaired ?  ?  ?  ?  ?  ?  ?  ?  ?  ?  ?  ?  ?  ?  ?  ?  ?  ?  ?  ?General ADL Comments: supervision - min guard for functional mobility and self care tasks with use of RW and cuing for safety awareness.  ? ? ? ?Vision Patient Visual Report: No change from baseline ?   ?   ?   ?   ? ?Pertinent Vitals/Pain Pain Assessment ?Pain Assessment: No/denies pain  ? ? ? ?Hand Dominance Right ?  ?Extremity/Trunk Assessment Upper Extremity Assessment ?Upper Extremity Assessment: Generalized weakness ?  ?Lower Extremity Assessment ?Lower  Extremity Assessment: Generalized weakness ?  ?  ?  ?Communication Communication ?Communication: No difficulties ?  ?Cognition Arousal/Alertness: Awake/alert ?Behavior During Therapy: [facility] for tasks assessed/performed ?Overall Cognitive Status: History of cognitive impairments - at baseline ?  ?  ?  ?  ?  ?  ?  ?  ?  ?  ?  ?  ?   ?  ?  ?  ?General Comments: Pt is oriented to self, location, and month. Per PT assessment, spouse reports this is baseline cognition. ?  ?  ?   ?   ?   ? ? ?Home Living Family/patient expects to be discharged to:: Private residence ?Living Arrangements: Spouse/significant other ?Available Help at Discharge: Family;Available 24 hours/day ?Type of Home: House ?Home Access: Level entry ?  ?  ?Home Layout: One level ?  ?  ?Bathroom Shower/Tub: Tub/shower unit ?  ?  ?  ?  ?Home Equipment: Kasandra Knudsen - single point ?  ?  ?  ? ?  ?Prior Functioning/Environment Prior Level of Function : Independent/Modified Independent;Needs assist ?  ?  ?  ?  ?  ?  ?Mobility Comments: Pt ambulates around house without AD, pts report PRN use of cane or furniture walking but typically no AD, denies falls. ?ADLs Comments: Pt reports independence in self care tasks and they share IADLs ?  ? ?  ?  ?OT Problem List: Decreased strength;Decreased activity tolerance;Impaired balance (sitting and/or standing);Decreased safety awareness ?  ?   ?OT Treatment/Interventions: Self-care/ADL training;Therapeutic exercise;Therapeutic activities;Energy conservation;DME and/or AE instruction;Manual therapy;Balance training;Patient/family education  ?  ?OT Goals(Current goals can be found in the care plan section) Acute Rehab OT Goals ?Patient Stated Goal: to go home and get better ?OT Goal Formulation: With patient ?Time For Goal Achievement: 12/07/21 ?Potential to Achieve Goals: Good ?ADL Goals ?Pt Will Perform Grooming: with modified independence;standing ?Pt Will Perform Lower Body Dressing: with modified independence;sit to/from stand ?Pt Will Transfer to Toilet: with modified independence;ambulating ?Pt Will Perform Toileting - Clothing Manipulation and hygiene: with modified independence;sit to/from stand  ?OT Frequency: Min 2X/week ?  ? ?   ?AM-PAC OT "6 Clicks" Daily Activity     ?Outcome Measure Help from another person eating meals?: None ?Help from  another person taking care of personal grooming?: None ?Help from another person toileting, which includes using toliet, bedpan, or urinal?: A Little ?Help from another person bathing (including washing, rinsing, drying)?: A Little ?Help from another person to put on and taking off regular upper body clothing?: None ?Help from another person to put on and taking off regular lower body clothing?: A Little ?6 Click Score: 21 ?  ?End of Session Equipment Utilized During Treatment: Rolling walker (2 wheels);Other (comment) (foley cath) ?Nurse Communication: Mobility status ? ?Activity Tolerance: Patient tolerated treatment well ?Patient left: in bed;with call bell/phone within reach;with bed alarm set ? ?OT Visit Diagnosis: Unsteadiness on feet (R26.81);Muscle weakness (generalized) (M62.81)  ?              ?Time: 1224-8250 ?OT Time Calculation (min): 14 min ?Charges:  OT General Charges ?$OT Visit: 1 Visit ?OT Evaluation ?$OT Eval Moderate Complexity: 1 Mod ?OT Treatments ?$Self Care/Home Management : 8-22 mins ? ?Darleen Crocker, [place], OTR/L , [place] ?ascom [phone]  ?11/23/21, 3:50 PM  ?

## 2021-11-24 ENCOUNTER — Encounter: Payer: Self-pay | Admitting: Internal Medicine

## 2021-11-24 DIAGNOSIS — G9341 Metabolic encephalopathy: Secondary | ICD-10-CM

## 2021-11-24 LAB — CBC
HCT: 35.2 % — ABNORMAL LOW (ref 36.0–46.0)
Hemoglobin: 11.5 g/dL — ABNORMAL LOW (ref 12.0–15.0)
MCH: 27.2 pg (ref 26.0–34.0)
MCHC: 32.7 g/dL (ref 30.0–36.0)
MCV: 83.2 fL (ref 80.0–100.0)
Platelets: 150 10*3/uL (ref 150–400)
RBC: 4.23 MIL/uL (ref 3.87–5.11)
RDW: 14 % (ref 11.5–15.5)
WBC: 8 10*3/uL (ref 4.0–10.5)
nRBC: 0 % (ref 0.0–0.2)

## 2021-11-24 LAB — BASIC METABOLIC PANEL
Anion gap: 11 (ref 5–15)
BUN: 102 mg/dL — ABNORMAL HIGH (ref 8–23)
CO2: 30 mmol/L (ref 22–32)
Calcium: 7.1 mg/dL — ABNORMAL LOW (ref 8.9–10.3)
Chloride: 104 mmol/L (ref 98–111)
Creatinine, Ser: 5.49 mg/dL — ABNORMAL HIGH (ref 0.44–1.00)
GFR, Estimated: 8 mL/min — ABNORMAL LOW (ref >60)
Glucose, Bld: 135 mg/dL — ABNORMAL HIGH (ref 70–99)
Potassium: 4.5 mmol/L (ref 3.5–5.1)
Sodium: 145 mmol/L (ref 135–145)

## 2021-11-24 LAB — GLUCOSE, CAPILLARY: Glucose-Capillary: 174 mg/dL — ABNORMAL HIGH (ref 70–99)

## 2021-11-24 MED ORDER — RINGERS IV SOLN: INTRAVENOUS | Status: DC

## 2021-11-24 NOTE — Progress Notes (Signed)
?[facility]  ?PROGRESS NOTE  ? ?Subjective:  ? ?Feels much better today. ?Urine output is over 2.5 L yesterday ?He ate breakfast. ? ?Objective:  ?Vital signs: ?Blood pressure 130/64, pulse 78, temperature 98.9 ?F (37.2 ?C), temperature source Oral, resp. rate 16, weight 84.7 kg, SpO2 98 %. ? ?Intake/Output Summary (Last 24 hours) at 11/24/2021 1254 ?Last data filed at 11/24/2021 1159 ?Gross per 24 hour  ?Intake 2464.63 ml  ?Output 2675 ml  ?Net -210.37 ml  ? ?Filed Weights  ? 11/21/21 1357  ?Weight: 84.7 kg  ? ? ? ?Physical Exam: ?General:  No acute distress  ?Head:  Normocephalic, atraumatic. Moist oral mucosal membranes  ?Eyes:  Anicteric  ?Neck:  Supple  ?Lungs:   Clear to auscultation, normal effort  ?Heart:  S1S2 no rubs  ?Abdomen:   Soft, nontender, bowel sounds present  ?Extremities:  peripheral edema.  ?Neurologic:  Awake, alert, following commands  ?Skin:  No lesions  ?Access:   ? ? ?Basic Metabolic Panel: ?Recent Labs  ?Lab 11/20/21 ?1600 11/21/21 ?0320 11/22/21 ?7062 11/23/21 ?0615 11/24/21 ?0441  ?NA 137 142 140 142 145  ?K 4.0 4.6 4.7 4.6 4.5  ?CL 105 110 111 105 104  ?CO2 16* 20* 18* 25 30  ?GLUCOSE 164* 109* 127* 116* 135*  ?BUN 120* 117* 135* 120* 102*  ?CREATININE 8.75* 8.09* 7.26* 6.23* 5.49*  ?CALCIUM 6.3* 7.2* 7.4* 7.2* 7.1*  ?MG 1.5*  --  1.7  --   --   ?PHOS 5.7*  --   --   --   --   ? ? ?CBC: ?Recent Labs  ?Lab 11/21/21 ?0320 11/22/21 ?3762 11/23/21 ?0615 11/24/21 ?0441  ?WBC 7.8 8.1 7.7 8.0  ?HGB 12.9 12.3 11.8* 11.5*  ?HCT 39.9 37.3 35.5* 35.2*  ?MCV 82.8 83.4 82.2 83.2  ?PLT 193 167 170 150  ? ? ? ?Urinalysis: ?No results for input(s): COLORURINE, LABSPEC, [place], GLUCOSEU, HGBUR, BILIRUBINUR, KETONESUR, PROTEINUR, UROBILINOGEN, NITRITE, LEUKOCYTESUR in the last 72 hours. ? ?Invalid input(s): APPERANCEUR  ? ? ?Imaging: ?No results found. ? ? ?Medications:  ? ? sodium bicarbonate 150 mEq in D5W infusion 75 mL/hr at 11/24/21 1112  ? ? apixaban  5 mg Oral BID  ? Chlorhexidine  Gluconate Cloth  6 each Topical Q0600  ? cholecalciferol  5,000 Units Oral q morning  ? gabapentin  600 mg Oral QHS  ? levothyroxine  125 mcg Oral Q0600  ? pantoprazole  40 mg Oral Daily  ? simvastatin  20 mg Oral q1800  ? ? ?Assessment/ Plan:  ?   ?71 year old female with history of hypertension, hyperlipidemia, GERD, restless leg syndrome, history of angios sarcoma, history of pulmonary embolism, left lower extremity DVT, history of papillary adenocarcinoma s/p lobectomy now admitted with history of severe hypocalcemia, hypomagnesemia and acute kidney injury. ? ?Principal Problem: ?  AKI (acute kidney injury) ([place]) ?Active Problems: ?  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  Pulmonary embolism ([place]) ?  Pure hypercholesterolemia ?  DVT (deep venous thrombosis) ([place]) ?  Hypothyroidism ?  Neuropathy ?  Left leg pain ?  Hypocalcemia ?  Acute metabolic encephalopathy ? ?#1: Acute kidney injury: Acute kidney injury is most likely secondary to severe prerenal azotemia.  Presently on bicarbonate drip.  CO2 has improved.  We will change IV fluids to Ringer's lactate at 100 cc an hour.  We will continue to monitor urine output closely. ? ?#2: Acute metabolic acidosis: Bicarb has improved to 30 now.  Will change IV  fluids to Ringer's lactate. ? ?#3: Anemia: We will continue to monitor closely. ? ?We will check calcium and magnesium levels tomorrow. ? ? LOS: 3 ?Lyla Son, MD ?[facility] ?4/29/202312:54 PM ?  ?

## 2021-11-24 NOTE — Progress Notes (Addendum)
? ? ? ?Progress Note  ? ? ?[name] [name]  JXB:[id number] DOB: February 18, 1951  DOA: 11/20/2021 ?PCP: Rusty Aus, MD  ? ? ? ? ?Brief Narrative:  ? ? ?Medical records reviewed and are as summarized below: ? ? ?71 year old female with hyperlipidemia, neuropathy, hypothyroid, insomnia, GERD, restless leg syndrome, history of radiation induced angiosarcoma, history of acute saddle pulmonary embolism with acute cor pulmonale in January 2020, left lower extremity DVT, on long-term Eliquis, anxiety, history of invasive papillary adenocarcinoma status post lobectomy, radiation, chemotherapy, presented from [place] [facility] for acute kidney injury. ? ?Initial vitals in the ED: T- 97.6, respiration rate of 16, heart rate 72, blood pressure 125/77, SPO2 of 94% on room air. ?LABS-Sodium 137, k 4.0, bicarb 16, BUN of 120, creatinine of 8.75, nonfasting blood glucose 164. ?CBC performed at [facility] clinic showed WBC of 8.1, hemoglobin 13.7, platelets of 208.High-sensitivity troponin was 12.  Phosphorus was 5.7.COVID/influenza A/influenza B PCR were negative.  CT abdomen pelvis CT head, UA and chest x-ray unremarkable. ?In ED got calcium gluconate 1 g, LR 2 L bolus, magnesium 2 g IV. ?She was admitted for AKI and acute metabolic encephalopathy. ? ?  ? ? ? ? ? ? ?Assessment/Plan:  ? ?Principal Problem: ?  AKI (acute kidney injury) ([place]) ?Active Problems: ?  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  Pulmonary embolism ([place]) ?  Pure hypercholesterolemia ?  DVT (deep venous thrombosis) ([place]) ?  Hypothyroidism ?  Neuropathy ?  Left leg pain ?  Hypocalcemia ?  Acute metabolic encephalopathy ? ? ?Body mass index is 29.26 kg/m?. ? ? ?AKI with metabolic acidosis ?CKD stage IIIb baseline creatinine 1.3 with EGFR 40 ?No acute abnormality on renal ultrasound and CT abdomen and pelvis.  Sodium bicarbonate infusion has been changed to IV Ringer's lactate infusion.  Monitor BMP and urine output. ?  ?Acute metabolic encephalopathy ?Improving ?   ?Left leg pain: Duplex of the lower extremities negative for DVT. ?  ?Hyperphosphatemia  ?Likely from AKI.  Repeat phosphorus tomorrow ? ?Hypocalcemia, hypomagnesemia:  ?Improving ?  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 ?Hypothyroidism: TSH suppressed 0.2 but normal free T4, continue on Synthroid 125. ?Neuropathy:Continue Neurontin at bedtime-pharmacy to adjust renal dose ?  ? ?Diet Order   ? ?       ?  Diet renal/carb modified with fluid restriction Diet-HS Snack? Nothing; Fluid restriction: 1200 mL Fluid; Room service appropriate? Yes; Fluid consistency: Thin  Diet effective now       ?  ? ?  ?  ? ?  ? ? ? ? ? ? ?Consultants: ?Nephrologist ? ?Procedures: ?None ? ? ? ?Medications:  ? ? apixaban  5 mg Oral BID  ? Chlorhexidine Gluconate Cloth  6 each Topical Q0600  ? cholecalciferol  5,000 Units Oral q morning  ? gabapentin  600 mg Oral QHS  ? levothyroxine  125 mcg Oral Q0600  ? pantoprazole  40 mg Oral Daily  ? simvastatin  20 mg Oral q1800  ? ?Continuous Infusions: ? ringers    ? ? ? ?Anti-infectives (From admission, onward)  ? ? None  ? ?  ? ? ? ? ? ? ? ? ? ?Family Communication/Anticipated D/C date and plan/Code Status  ? ?DVT prophylaxis: Place TED hose Start: 11/20/21 1844 ?apixaban (ELIQUIS) tablet 5 mg  ? ?  Code Status: Full Code ? ?Family Communication: None ?  Disposition Plan: Plan to discharge home in 1 to 2 days ? ? ?Status is: Inpatient ?Remains inpatient appropriate because: IV fluids for AKI ? ? ? ? ? ? ?Subjective:  ? ?Interval events noted.  No nausea, vomiting or abdominal pain.  No shortness of breath or chest pain ? ?Objective:  ? ? ?Vitals:  ? 11/23/21 2345 11/24/21 0400 11/24/21 0832 11/24/21 1153  ?BP: 117/79 95/72 (!) 121/58 130/64  ?Pulse: 82 79 75 78  ?Resp: _0 ?Temp: 99.1 ?F (37.3 ?C) 98.9 ?F (37.2 ?C) 98 ?F (36.7 ?C) 98.9 ?F (37.2 ?C)   ?TempSrc: Oral Oral  Oral  ?SpO2: 95% 96% 95% 98%  ?Weight:      ? ?No data found. ? ? ?Intake/Output Summary (Last 24 hours) at 11/24/2021 1318 ?Last data filed at 11/24/2021 1159 ?Gross per 24 hour  ?Intake 2464.63 ml  ?Output 2675 ml  ?Net -210.37 ml  ? ?Filed Weights  ? 11/21/21 1357  ?Weight: 84.7 kg  ? ? ?Exam: ? ?GEN: NAD ?SKIN: No rash ?EYES: EOMI ?ENT: MMM ?CV: RRR ?PULM: CTA B ?ABD: soft, ND, NT, +BS ?CNS: AAO x 3, non focal ?EXT: No edema or tenderness ?GU: Foley catheter with amber urine ? ? ?  ? ? ?Data Reviewed:  ? ?I have personally reviewed following labs and imaging studies: ? ?Labs: ?Labs show the following:  ? ?Basic Metabolic Panel: ?Recent Labs  ?Lab 11/20/21 ?1600 11/21/21 ?0320 11/22/21 ?2376 11/23/21 ?0615 11/24/21 ?0441  ?NA 137 142 140 142 145  ?K 4.0 4.6 4.7 4.6 4.5  ?CL 105 110 111 105 104  ?CO2 16* 20* 18* 25 30  ?GLUCOSE 164* 109* 127* 116* 135*  ?BUN 120* 117* 135* 120* 102*  ?CREATININE 8.75* 8.09* 7.26* 6.23* 5.49*  ?CALCIUM 6.3* 7.2* 7.4* 7.2* 7.1*  ?MG 1.5*  --  1.7  --   --   ?PHOS 5.7*  --   --   --   --   ? ?GFR ?CrCl cannot be calculated (Unknown ideal weight.). ?Liver Function Tests: ?Recent Labs  ?Lab 11/21/21 ?0320 11/22/21 ?2831  ?AST 15 13*  ?ALT 10 10  ?ALKPHOS 50 47  ?BILITOT 0.4 0.3  ?PROT 6.0* 5.7*  ?ALBUMIN 2.9* 2.8*  ? ?No results for input(s): LIPASE, AMYLASE in the last 168 hours. ?Recent Labs  ?Lab 11/20/21 ?1525  ?AMMONIA 35  ? ?Coagulation profile ?No results for input(s): INR, PROTIME in the last 168 hours. ? ?CBC: ?Recent Labs  ?Lab 11/21/21 ?0320 11/22/21 ?5176 11/23/21 ?0615 11/24/21 ?0441  ?WBC 7.8 8.1 7.7 8.0  ?HGB 12.9 12.3 11.8* 11.5*  ?HCT 39.9 37.3 35.5* 35.2*  ?MCV 82.8 83.4 82.2 83.2  ?PLT 193 167 170 150  ? ?Cardiac Enzymes: ?No results for input(s): CKTOTAL, CKMB, CKMBINDEX, TROPONINI in the last 168 hours. ?BNP (last 3 results) ?No results for input(s): PROBNP in the last 8760 hours. ?CBG: ?Recent Labs  ?Lab 11/24/21 ?1133  ?GLUCAP 174*   ? ?D-Dimer: ?No results for input(s): DDIMER in the last 72 hours. ?Hgb A1c: ?No results for input(s): HGBA1C in the last 72 hours. ?Lipid Profile: ?No results for input(s): CHOL, HDL, LDLCALC, TRIG, CHOLHDL, LDLDIRECT in the last 72 hours. ?Thyroid function studies: ?No results for input(s): TSH, T4TOTAL, T3FREE, THYROIDAB in the last 72 hours. ? ?Invalid input(s): FREET3 ?Anemia work up: ?No results for input(s): VITAMINB12, FOLATE, FERRITIN, TIBC, IRON, RETICCTPCT in the last 72 hours. ?Sepsis Labs: ?Recent Labs  ?Lab 11/21/21 ?0320 11/22/21 ?1607 11/23/21 ?  4932 11/24/21 ?0441  ?WBC 7.8 8.1 7.7 8.0  ? ? ?Microbiology ?Recent Results (from the past 240 hour(s))  ?Resp Panel by RT-PCR (Flu A&B, Covid) Nasopharyngeal Swab     Status: None  ? Collection Time: 11/20/21  3:28 PM  ? Specimen: Nasopharyngeal Swab; Nasopharyngeal(NP) swabs in vial transport medium  ?Result Value Ref Range Status  ? SARS Coronavirus 2 by RT PCR NEGATIVE NEGATIVE Final  ?  Comment: (NOTE) ?SARS-CoV-2 target nucleic acids are NOT DETECTED. ? ?The SARS-CoV-2 RNA is generally detectable in upper respiratory ?specimens during the acute phase of infection. The lowest ?concentration of SARS-CoV-2 viral copies this assay can detect is ?138 copies/mL. A negative result does not preclude SARS-Cov-2 ?infection and should not be used as the sole basis for treatment or ?other patient management decisions. A negative result may occur with  ?improper specimen collection/handling, submission of specimen other ?than nasopharyngeal swab, presence of viral mutation(s) within the ?areas targeted by this assay, and inadequate number of viral ?copies(<138 copies/mL). A negative result must be combined with ?clinical observations, patient history, and epidemiological ?information. The expected result is Negative. ? ?Fact Sheet for Patients:  ?EntrepreneurPulse.com.au ? ?Fact Sheet for Healthcare Providers:   ?IncredibleEmployment.be ? ?This test is no t yet approved or cleared by the [place] FDA and  ?has been authorized for detection and/or diagnosis of SARS-CoV-2 by ?FDA under an Emergency Use Authorization (EUA). This EUA will remain

## 2021-11-25 LAB — MAGNESIUM: Magnesium: 1.5 mg/dL — ABNORMAL LOW (ref 1.7–2.4)

## 2021-11-25 LAB — RENAL FUNCTION PANEL
Albumin: 2.8 g/dL — ABNORMAL LOW (ref 3.5–5.0)
Anion gap: 10 (ref 5–15)
BUN: 97 mg/dL — ABNORMAL HIGH (ref 8–23)
CO2: 30 mmol/L (ref 22–32)
Calcium: 7.4 mg/dL — ABNORMAL LOW (ref 8.9–10.3)
Chloride: 106 mmol/L (ref 98–111)
Creatinine, Ser: 5.05 mg/dL — ABNORMAL HIGH (ref 0.44–1.00)
GFR, Estimated: 9 mL/min — ABNORMAL LOW (ref >60)
Glucose, Bld: 113 mg/dL — ABNORMAL HIGH (ref 70–99)
Phosphorus: 4.3 mg/dL (ref 2.5–4.6)
Potassium: 4.6 mmol/L (ref 3.5–5.1)
Sodium: 146 mmol/L — ABNORMAL HIGH (ref 135–145)

## 2021-11-25 LAB — CBC
HCT: 35 % — ABNORMAL LOW (ref 36.0–46.0)
Hemoglobin: 11.2 g/dL — ABNORMAL LOW (ref 12.0–15.0)
MCH: 27.1 pg (ref 26.0–34.0)
MCHC: 32 g/dL (ref 30.0–36.0)
MCV: 84.5 fL (ref 80.0–100.0)
Platelets: 144 10*3/uL — ABNORMAL LOW (ref 150–400)
RBC: 4.14 MIL/uL (ref 3.87–5.11)
RDW: 14.1 % (ref 11.5–15.5)
WBC: 6.6 10*3/uL (ref 4.0–10.5)
nRBC: 0 % (ref 0.0–0.2)

## 2021-11-25 MED ORDER — LACTATED RINGERS IV SOLN: INTRAVENOUS | Status: DC

## 2021-11-25 MED ORDER — MAGNESIUM SULFATE 2 GM/50ML IV SOLN
2.0000 g | Freq: Once | INTRAVENOUS | Status: AC
  Administered 2021-11-25: 2 g via INTRAVENOUS
  Filled 2021-11-25: qty 50

## 2021-11-25 NOTE — Progress Notes (Signed)
? ? ? ?Progress Note  ? ? ?[name] [name]  PFX:[id number] DOB: 07/10/1951  DOA: 11/20/2021 ?PCP: Rusty Aus, MD  ? ? ? ? ?Brief Narrative:  ? ? ?Medical records reviewed and are as summarized below: ? ? ?71 year old female with hyperlipidemia, neuropathy, hypothyroid, insomnia, GERD, restless leg syndrome, history of radiation induced angiosarcoma, history of acute saddle pulmonary embolism with acute cor pulmonale in January 2020, left lower extremity DVT, on long-term Eliquis, anxiety, history of invasive papillary adenocarcinoma status post lobectomy, radiation, chemotherapy, presented from [facility] for acute kidney injury. ? ?Initial vitals in the ED: T- 97.6, respiration rate of 16, heart rate 72, blood pressure 125/77, SPO2 of 94% on room air. ?LABS-Sodium 137, k 4.0, bicarb 16, BUN of 120, creatinine of 8.75, nonfasting blood glucose 164. ?CBC performed at [facility] clinic showed WBC of 8.1, hemoglobin 13.7, platelets of 208.High-sensitivity troponin was 12.  Phosphorus was 5.7.COVID/influenza A/influenza B PCR were negative.  CT abdomen pelvis CT head, UA and chest x-ray unremarkable. ?In ED got calcium gluconate 1 g, LR 2 L bolus, magnesium 2 g IV. ?She was admitted for AKI and acute metabolic encephalopathy. ? ?  ? ? ? ? ? ? ?Assessment/Plan:  ? ?Principal Problem: ?  AKI (acute kidney injury) ([place]) ?Active Problems: ?  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  Pulmonary embolism ([place]) ?  Pure hypercholesterolemia ?  DVT (deep venous thrombosis) ([place]) ?  Hypothyroidism ?  Neuropathy ?  Left leg pain ?  Hypocalcemia ?  Acute metabolic encephalopathy ? ? ?Body mass index is 29.26 kg/m?. ? ? ?AKI with metabolic acidosis ?CKD stage IIIb baseline creatinine 1.3 with EGFR 40 ?No acute abnormality on renal ultrasound and CT abdomen and pelvis.  Continue IV fluids.  Monitor BMP and urine output.  Has adequate urine output. ? ?Acute metabolic encephalopathy ?Improved ?  ?Left leg pain: Duplex of the  lower extremities negative for DVT. ?  ?Hyperphosphatemia  ?Improved ? ?Hypocalcemia, hypomagnesemia: Calcium is improving. ?Replete magnesium with IV magnesium sulfate ?  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 ?Hypothyroidism: TSH suppressed 0.2 but normal free T4, continue on Synthroid 125. ?Neuropathy:Continue Neurontin at bedtime-pharmacy to adjust renal dose ?  ? ?Diet Order   ? ?       ?  Diet renal/carb modified with fluid restriction Diet-HS Snack? Nothing; Fluid restriction: 1200 mL Fluid; Room service appropriate? Yes; Fluid consistency: Thin  Diet effective now       ?  ? ?  ?  ? ?  ? ? ? ? ? ? ?Consultants: ?Nephrologist ? ?Procedures: ?None ? ? ? ?Medications:  ? ? apixaban  5 mg Oral BID  ? Chlorhexidine Gluconate Cloth  6 each Topical Q0600  ? cholecalciferol  5,000 Units Oral q morning  ? gabapentin  600 mg Oral QHS  ? levothyroxine  125 mcg Oral Q0600  ? pantoprazole  40 mg Oral Daily  ? simvastatin  20 mg Oral q1800  ? ?Continuous Infusions: ? lactated ringers 100 mL/hr at 11/25/21 1521  ? ? ? ?Anti-infectives (From admission, onward)  ? ? None  ? ?  ? ? ? ? ? ? ? ? ? ?Family Communication/Anticipated D/C date and plan/Code Status  ? ?DVT prophylaxis: Place TED hose Start: 11/20/21 1844 ?apixaban (ELIQUIS) tablet 5 mg  ? ?  Code Status: Full Code ? ?Family Communication:  None ?Disposition Plan: Plan to discharge home in 1 to 2 days ? ? ?Status is: Inpatient ?Remains inpatient appropriate because: IV fluids for AKI ? ? ? ? ? ? ?Subjective:  ? ?No complaints.  No shortness of breath or chest pain.  She feels better. ?Objective:  ? ? ?Vitals:  ? 11/25/21 0118 11/25/21 0510 11/25/21 0733 11/25/21 1151  ?BP: (!) 136/59 132/63 (!) 104/56 110/64  ?Pulse: 83 78 74 78  ?Resp: $Remov'16 18 17 17  'BRSRlM$ ?Temp: 98.5 ?F (36.9 ?C) 98.2 ?F (36.8 ?C) 98.1 ?F (36.7 ?C) 98.2 ?F (36.8 ?C)   ?TempSrc: Oral Oral    ?SpO2: 95% 93% 95% 95%  ?Weight:      ? ?No data found. ? ? ?Intake/Output Summary (Last 24 hours) at 11/25/2021 1559 ?Last data filed at 11/25/2021 1427 ?Gross per 24 hour  ?Intake 2157.21 ml  ?Output 4225 ml  ?Net -2067.79 ml  ? ?Filed Weights  ? 11/21/21 1357  ?Weight: 84.7 kg  ? ? ?Exam: ? ?GEN: NAD ?SKIN: No rash ?EYES: EOMI ?ENT: MMM ?CV: RRR ?PULM: CTA B ?ABD: soft, ND, NT, +BS ?CNS: AAO x 3, non focal ?EXT: No edema or tenderness ?GU: Foley catheter draining amber urine ? ? ? ?  ? ? ?Data Reviewed:  ? ?I have personally reviewed following labs and imaging studies: ? ?Labs: ?Labs show the following:  ? ?Basic Metabolic Panel: ?Recent Labs  ?Lab 11/20/21 ?1600 11/21/21 ?0320 11/22/21 ?8099 11/23/21 ?8338 11/24/21 ?2505 11/25/21 ?3976  ?NA 137 142 140 142 145 146*  ?K 4.0 4.6 4.7 4.6 4.5 4.6  ?CL 105 110 111 105 104 106  ?CO2 16* 20* 18* $Remov'25 30 30  'ZeohEv$ ?GLUCOSE 164* 109* 127* 116* 135* 113*  ?BUN 120* 117* 135* 120* 102* 97*  ?CREATININE 8.75* 8.09* 7.26* 6.23* 5.49* 5.05*  ?CALCIUM 6.3* 7.2* 7.4* 7.2* 7.1* 7.4*  ?MG 1.5*  --  1.7  --   --  1.5*  ?PHOS 5.7*  --   --   --   --  4.3  ? ?GFR ?CrCl cannot be calculated (Unknown ideal weight.). ?Liver Function Tests: ?Recent Labs  ?Lab 11/21/21 ?0320 11/22/21 ?7341 11/25/21 ?9379  ?AST 15 13*  --   ?ALT 10 10  --   ?ALKPHOS 50 47  --   ?BILITOT 0.4 0.3  --   ?PROT 6.0* 5.7*  --   ?ALBUMIN 2.9* 2.8* 2.8*  ? ?No results for input(s): LIPASE, AMYLASE in the last 168 hours. ?Recent Labs  ?Lab 11/20/21 ?1525  ?AMMONIA 35  ? ?Coagulation profile ?No results for input(s): INR, PROTIME in the last 168 hours. ? ?CBC: ?Recent Labs  ?Lab 11/21/21 ?0320 11/22/21 ?0240 11/23/21 ?9735 11/24/21 ?3299 11/25/21 ?2426  ?WBC 7.8 8.1 7.7 8.0 6.6  ?HGB 12.9 12.3 11.8* 11.5* 11.2*  ?HCT 39.9 37.3 35.5* 35.2* 35.0*  ?MCV 82.8 83.4 82.2 83.2 84.5  ?PLT 193 167 170 150 144*  ? ?Cardiac Enzymes: ?No results for input(s): CKTOTAL, CKMB, CKMBINDEX, TROPONINI in the last 168  hours. ?BNP (last 3 results) ?No results for input(s): PROBNP in the last 8760 hours. ?CBG: ?Recent Labs  ?Lab 11/24/21 ?1133  ?GLUCAP 174*  ? ?D-Dimer: ?No results for input(s): DDIMER in the last 72 hours. ?Hgb A1c: ?No results for input(s): HGBA1C in the last 72 hours. ?Lipid Profile: ?No results for input(s): CHOL, HDL, LDLCALC, TRIG, CHOLHDL, LDLDIRECT in the last 72 hours. ?Thyroid function studies: ?No results for input(s): TSH, T4TOTAL, T3FREE, THYROIDAB in the last  72 hours. ? ?Invalid input(s): FREET3 ?Anemia work up: ?No results for input(s): VITAMINB12, FOLATE, FERRITIN, TIBC, IRON, RETICCTPCT in the last 72 hours. ?Sepsis Labs: ?Recent Labs  ?Lab 11/22/21 ?8022 11/23/21 ?0615 11/24/21 ?0441 11/25/21 ?1798  ?WBC 8.1 7.7 8.0 6.6  ? ? ?Microbiology ?Recent Results (from the past 240 hour(s))  ?Resp Panel by RT-PCR (Flu A&B, Covid) Nasopharyngeal Swab     Status: None  ? Collection Time: 11/20/21  3:28 PM  ? Specimen: Nasopharyngeal Swab; Nasopharyngeal(NP) swabs in vial transport medium  ?Result Value Ref Range Status  ? SARS Coronavirus 2 by RT PCR NEGATIVE NEGATIVE Final  ?  Comment: (NOTE) ?SARS-CoV-2 target nucleic acids are NOT DETECTED. ? ?The SARS-CoV-2 RNA is generally detectable in upper respiratory ?specimens during the acute phase of infection. The lowest ?concentration of SARS-CoV-2 viral copies this assay can detect is ?138 copies/mL. A negative result does not preclude SARS-Cov-2 ?infection and should not be used as the sole basis for treatment or ?other patient management decisions. A negative result may occur with  ?improper specimen collection/handling, submission of specimen other ?than nasopharyngeal swab, presence of viral mutation(s) within the ?areas targeted by this assay, and inadequate number of viral ?copies(<138 copies/mL). A negative result must be combined with ?clinical observations, patient history, and epidemiological ?information. The expected result is Negative. ? ?Fact  Sheet for Patients:  ?EntrepreneurPulse.com.au ? ?Fact Sheet for Healthcare Providers:  ?IncredibleEmployment.be ? ?This test is no t yet approved or cleared by the [place]

## 2021-11-25 NOTE — Progress Notes (Signed)
Per MD Korrapati order - foley to remain in place today.  Pt to be ambulated today and re-assess foley tomorrow  MD Ayiku aware.  Will continue to follow. ?

## 2021-11-25 NOTE — Progress Notes (Signed)
?[facility]  ?PROGRESS NOTE  ? ?Subjective:  ? ?Feels much better today. ?Urine output over 3.7 L yesterday. ?He ate much better today. ? ?Objective:  ?Vital signs: ?Blood pressure 110/64, pulse 78, temperature 98.2 ?F (36.8 ?C), resp. rate 17, weight 84.7 kg, SpO2 95 %. ? ?Intake/Output Summary (Last 24 hours) at 11/25/2021 1311 ?Last data filed at 11/25/2021 1150 ?Gross per 24 hour  ?Intake 2451.03 ml  ?Output 6225 ml  ?Net -3773.97 ml  ? ?Filed Weights  ? 11/21/21 1357  ?Weight: 84.7 kg  ? ? ? ?Physical Exam: ?General:  No acute distress  ?Head:  Normocephalic, atraumatic. Moist oral mucosal membranes  ?Eyes:  Anicteric  ?Neck:  Supple  ?Lungs:   Clear to auscultation, normal effort  ?Heart:  S1S2 no rubs  ?Abdomen:   Soft, nontender, bowel sounds present  ?Extremities:  peripheral edema.  ?Neurologic:  Awake, alert, following commands  ?Skin:  No lesions  ?Access:   ? ? ?Basic Metabolic Panel: ?Recent Labs  ?Lab 11/20/21 ?1600 11/21/21 ?0320 11/22/21 ?7782 11/23/21 ?4235 11/24/21 ?3614 11/25/21 ?4315  ?NA 137 142 140 142 145 146*  ?K 4.0 4.6 4.7 4.6 4.5 4.6  ?CL 105 110 111 105 104 106  ?CO2 16* 20* 18* '25 30 30  '$ ?GLUCOSE 164* 109* 127* 116* 135* 113*  ?BUN 120* 117* 135* 120* 102* 97*  ?CREATININE 8.75* 8.09* 7.26* 6.23* 5.49* 5.05*  ?CALCIUM 6.3* 7.2* 7.4* 7.2* 7.1* 7.4*  ?MG 1.5*  --  1.7  --   --  1.5*  ?PHOS 5.7*  --   --   --   --  4.3  ? ? ?CBC: ?Recent Labs  ?Lab 11/21/21 ?0320 11/22/21 ?4008 11/23/21 ?6761 11/24/21 ?9509 11/25/21 ?3267  ?WBC 7.8 8.1 7.7 8.0 6.6  ?HGB 12.9 12.3 11.8* 11.5* 11.2*  ?HCT 39.9 37.3 35.5* 35.2* 35.0*  ?MCV 82.8 83.4 82.2 83.2 84.5  ?PLT 193 167 170 150 144*  ? ? ? ?Urinalysis: ?No results for input(s): COLORURINE, LABSPEC, [place], GLUCOSEU, HGBUR, BILIRUBINUR, KETONESUR, PROTEINUR, UROBILINOGEN, NITRITE, LEUKOCYTESUR in the last 72 hours. ? ?Invalid input(s): APPERANCEUR  ? ? ?Imaging: ?No results found. ? ? ?Medications:  ? ? lactated ringers 100 mL/hr at  11/25/21 1032  ? ? apixaban  5 mg Oral BID  ? Chlorhexidine Gluconate Cloth  6 each Topical Q0600  ? cholecalciferol  5,000 Units Oral q morning  ? gabapentin  600 mg Oral QHS  ? levothyroxine  125 mcg Oral Q0600  ? pantoprazole  40 mg Oral Daily  ? simvastatin  20 mg Oral q1800  ? ? ?Assessment/ Plan:  ?   ?71 year old female with history of hypertension, hyperlipidemia, GERD, restless leg syndrome, history of angios sarcoma, history of pulmonary embolism, left lower extremity DVT, history of papillary adenocarcinoma s/p lobectomy now admitted with history of severe hypocalcemia, hypomagnesemia and acute kidney injury. ?  ?Principal Problem: ?  AKI (acute kidney injury) ([place]) ?Active Problems: ?  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  Pulmonary embolism ([place]) ?  Pure hypercholesterolemia ?  DVT (deep venous thrombosis) ([place]) ?  Hypothyroidism ?  Neuropathy ?  Left leg pain ?  Hypocalcemia ?  Acute metabolic encephalopathy ?  ?#1: Acute kidney injury: Acute kidney injury is most likely secondary to severe prerenal azotemia.  IV fluids changed to Ringer's lactate yesterday. We will continue to monitor urine output closely. ?  ?#2: Acute metabolic acidosis: Improved with bicarb drip ? ?#3: Anemia: We will continue to  monitor closely. ? ?#4: Hypomagnesemia: We will supplement magnesium. ?  ?We will continue to monitor the urine output today. ?Ambulate her today with assistance and remove the Foley catheter tomorrow morning. ? ? LOS: 4 ?Lyla Son, MD ?[facility] ?4/30/20231:11 PM ?  ?

## 2021-11-25 NOTE — Progress Notes (Signed)
Mobility Specialist - Progress Note ? ? ? 11/25/21 1500  ?Mobility  ?Activity Ambulated with assistance in hallway;Stood at bedside  ?Level of Assistance Standby assist, set-up cues, supervision of patient - no hands on  ?Assistive Device Front wheel walker  ?Distance Ambulated (ft) 200 ft  ?Activity Response Tolerated well  ?$Mobility charge 1 Mobility  ? ? ? ?Pt in recliner upon arrival using RA. Pt completes STS with supervision and ambulates 271f SBA voicing no complaints and returns to recliner with needs in reach and chair alarm set. ? ?MMerrily Brittle?Mobility Specialist ?11/25/21, 3:33 PM; ? ? ? ? ?

## 2021-11-25 NOTE — Progress Notes (Signed)
Pt transferred to [facility] room 201. Taken to room via bed. Belongings including clothing and cell phone charger sent with pt. Per day shift RN family took her cell phone with them during the day to try and fix it.  ?RN called pt's husband and left voicemail to notify of room transfer.  ?

## 2021-11-26 LAB — CBC
HCT: 34.4 % — ABNORMAL LOW (ref 36.0–46.0)
Hemoglobin: 10.8 g/dL — ABNORMAL LOW (ref 12.0–15.0)
MCH: 27 pg (ref 26.0–34.0)
MCHC: 31.4 g/dL (ref 30.0–36.0)
MCV: 86 fL (ref 80.0–100.0)
Platelets: 140 10*3/uL — ABNORMAL LOW (ref 150–400)
RBC: 4 MIL/uL (ref 3.87–5.11)
RDW: 13.7 % (ref 11.5–15.5)
WBC: 6.3 10*3/uL (ref 4.0–10.5)
nRBC: 0 % (ref 0.0–0.2)

## 2021-11-26 LAB — RENAL FUNCTION PANEL
Albumin: 2.7 g/dL — ABNORMAL LOW (ref 3.5–5.0)
Anion gap: 10 (ref 5–15)
BUN: 82 mg/dL — ABNORMAL HIGH (ref 8–23)
CO2: 27 mmol/L (ref 22–32)
Calcium: 7.7 mg/dL — ABNORMAL LOW (ref 8.9–10.3)
Chloride: 108 mmol/L (ref 98–111)
Creatinine, Ser: 4.58 mg/dL — ABNORMAL HIGH (ref 0.44–1.00)
GFR, Estimated: 10 mL/min — ABNORMAL LOW (ref >60)
Glucose, Bld: 108 mg/dL — ABNORMAL HIGH (ref 70–99)
Phosphorus: 4.1 mg/dL (ref 2.5–4.6)
Potassium: 4.4 mmol/L (ref 3.5–5.1)
Sodium: 145 mmol/L (ref 135–145)

## 2021-11-26 LAB — MAGNESIUM: Magnesium: 1.7 mg/dL (ref 1.7–2.4)

## 2021-11-26 MED ORDER — APIXABAN 5 MG PO TABS: 5.0000 mg | ORAL_TABLET | Freq: Two times a day (BID) | ORAL | Status: DC

## 2021-11-26 NOTE — Discharge Summary (Signed)
?Physician Discharge Summary ?  ?Patient: [name] [name] MRN: [id number] DOB: 07/12/1951  ?Admit date:     11/20/2021  ?Discharge date: 11/26/2021  ?Discharge Physician: Jennye Boroughs  ? ?PCP: Rusty Aus, MD  ? ?Recommendations at discharge:  ? ? ?Follow-up with Dr. Murlean Iba, nephrologist, within 4 days of discharge ? ?Discharge Diagnoses: ?Principal Problem: ?  AKI (acute kidney injury) ([place]) ?Active Problems: ?  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  Pulmonary embolism ([place]) ?  Pure hypercholesterolemia ?  DVT (deep venous thrombosis) ([place]) ?  Hypothyroidism ?  Neuropathy ?  Left leg pain ?  Hypocalcemia ?  Acute metabolic encephalopathy ? ?Resolved Problems: ?  * No resolved hospital problems. * ? 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 ? ?Initial vitals in the ED: T- 97.6, respiration rate of 16, heart rate 72, blood pressure 125/77, SPO2 of 94% on room air. ?LABS-Sodium 137, k 4.0, bicarb 16, BUN of 120, creatinine of 8.75, nonfasting blood glucose 164. ?CBC performed at [facility] clinic showed WBC of 8.1, hemoglobin 13.7, platelets of 208.High-sensitivity troponin was 12.  Phosphorus was 5.7.COVID/influenza A/influenza B PCR were negative.  CT abdomen pelvis CT head, UA and chest x-ray unremarkable. ? ? ?She was admitted for AKI complicated by metabolic acidosis and acute metabolic encephalopathy.  She was treated with IV fluids including IV sodium bicarbonate infusion. She also had hypocalcemia and hypomagnesemia that were repleted. Her condition slowly improved. She was evaluated by PT  who recommended home health therapy.  ? ? ? ?  ? ? ?Consultants: Nephrologist ?Procedures performed: None  ?Disposition: Home ?Diet recommendation:  ?Discharge Diet Orders (From admission, onward)  ? ?  Start     Ordered  ? 11/26/21 0000  Diet - low sodium heart healthy       ? 11/26/21 1045  ? ?  ?  ? ?  ? ?Cardiac diet ?DISCHARGE MEDICATION: ?Allergies as of 11/26/2021   ?No Known Allergies ?  ? ?  ?Medication List  ?  ? ?STOP taking these medications   ? ?predniSONE 20 MG tablet ?Commonly known as: DELTASONE ?  ?temazepam 15 MG capsule ?Commonly known as: RESTORIL ?  ? ?  ? ?TAKE these medications   ? ?acetaminophen 325 MG tablet ?Commonly known as: TYLENOL ?Take 325 mg by mouth every 4 (four) hours as needed. ?  ?apixaban 5 MG Tabs tablet ?Commonly known as: ELIQUIS ?Take 1 tablet (5 mg total) by mouth 2 (two) times daily. ?  ?Cholecalciferol 125 MCG (5000 UT) Tabs ?Take by mouth every morning. ?  ?Cyanocobalamin 500 MCG Tbdp ?Take by mouth every morning. ?  ?fluticasone 50 MCG/ACT nasal spray ?Commonly known as: FLONASE ?Place 2 sprays into both nostrils daily. ?  ?gabapentin 300 MG capsule ?Commonly known as: NEURONTIN ?Take 900 mg by mouth at bedtime. ?  ?levothyroxine 125 MCG tablet ?Commonly known as: SYNTHROID ?Take 125 mcg by mouth. ?  ?LORazepam 1 MG tablet ?Commonly known as: ATIVAN ?Take 1 mg by mouth at bedtime. ?  ?omeprazole 20 MG capsule ?Commonly known as: PRILOSEC ?Take 20 mg by mouth daily. ?  ?simvastatin 20 MG tablet ?Commonly known as: ZOCOR ?Take 20 mg by mouth  daily at 6 PM. ?  ?traZODone 50 MG tablet ?Commonly known as: DESYREL ?Take 50 mg by mouth at bedtime. ?  ? ?  ? ? Follow-up Information   ? ? Murlean Iba, MD. Schedule an appointment as soon as possible for a visit on 11/30/2021.   ?Specialty: Nephrology ?Contact information: ?[address] ?[address] ?[place] [address] ?[phone] ? ? ?  ?  ? ?  ?  ? ?  ? ?Discharge Exam: ?Filed Weights  ? 11/21/21 1357 11/25/21 2148   ?Weight: 84.7 kg 82.6 kg  ? ?GEN: NAD ?SKIN: No rash ?EYES: EOMI ?ENT: MMM ?CV: RRR ?PULM: CTA B ?ABD: soft, ND, NT, +BS ?CNS: AAO x 3, non focal ?EXT: No edema or tenderness ? ? ?Condition at discharge: good ? ?The results of significant diagnostics from this hospitalization (including imaging, microbiology, ancillary and laboratory) are listed below for reference.  ? ?Imaging Studies: ?CT ABDOMEN PELVIS WO CONTRAST ? ?Result Date: 11/20/2021 ?CLINICAL DATA:  Kidney failure, acute renal failure EXAM: CT ABDOMEN AND PELVIS WITHOUT CONTRAST TECHNIQUE: Multidetector CT imaging of the abdomen and pelvis was performed following the standard protocol without IV contrast. RADIATION DOSE REDUCTION: This exam was performed according to the departmental dose-optimization program which includes automated exposure control, adjustment of the mA and/or kV according to patient size and/or use of iterative reconstruction technique. COMPARISON:  CT 08/22/2021, CT 08/18/2018. FINDINGS: Lower chest: Prior right mastectomy. Similar scarring in the right middle lobe. Unchanged small nodules in the periphery of the right lower lobe largest measuring 4 mm dating back to January 2020 (series 6, image 3). These are benign. No acute findings seen in the lower chest. Hepatobiliary: Unchanged left hepatic cyst. No other parenchymal abnormality. Cholelithiasis. No cholecystitis. Pancreas: Fatty atrophy.  No ductal dilation. Spleen: Normal in size without focal abnormality. 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 No hydronephrosis or nephrolithiasis. Moderate bladder distension. Stomach/Bowel: Small hiatal hernia. The stomach is otherwise unremarkable. There is no evidence of bowel obstruction.Prior appendectomy. There is no acute inflammatory process involving the bowel. Colonic diverticulosis. No diverticulitis. Vascular/Lymphatic: There is an IVC filter in place with multiple legs protruding through the IVC lumen.  Minimal aortoiliac atherosclerotic calcifications. No AAA. No lymphadenopathy. Reproductive: Unremarkable. Other: Diastasis recti and small umbilical hernia containing a nonobstructed loop of small bowel. No ascites. No free air. Musculoskeletal: No acute osseous abnormality. No suspicious lytic or blastic lesions. Multilevel degenerative changes of the spine. 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 IMPRESSION: No acute findings in the abdomen or pelvis. No hydronephrosis or nephroureterolithiasis. Small umbilical hernia containing a nonobstructed loop of small bowel. Cholelithiasis.  Colonic diverticulosis. Electronically Signed   By: Maurine Simmering M.D.   On: 11/20/2021 16:46  ? ?DG Chest 2 View ? ?Result Date: 11/20/2021 ?CLINICAL DATA:  Fatigue, weakness and dizziness. Recent COVID diagnosis. EXAM: CHEST - 2 VIEW COMPARISON:  Chest x-ray 08/18/2018 and chest CT 08/22/2021 FINDINGS: The cardiac silhouette, mediastinal and hilar contours are within normal limits given the AP projection. Stable postoperative changes involving the right upper hemithorax and status post right mastectomy. The lungs are clear of an acute process. No pulmonary lesions or pleural effusions. The bony thorax is stable. IMPRESSION: No acute cardiopulmonary findings. Stable postoperative changes involving the right hemithorax. Electronically Signed   By: Marijo Sanes M.D.   On: 11/20/2021 16:59  ? ?CT HEAD WO CONTRAST (5MM) ? ?Result Date: 11/20/2021 ?CLINICAL DATA:  Mental status change EXAM: CT HEAD WITHOUT CONTRAST TECHNIQUE:  Contiguous axial images were obtained from the base of the skull through the vertex without intravenous contrast. RADIATION DOSE REDUCTION: This exam was performed according to the departmental dose-optimization program which includes automated exposure control, adjustment of the mA and/or kV according to patient size and/or use of iterative reconstruction technique. COMPARISON:  None. FINDINGS: Brain: No acute  intracranial hemorrhage, mass effect, or herniation. No extra-axial fluid collections. No evidence of acute territorial infarct. No hydrocephalus. Patchy hypodensities in the periventricular and subcortical

## 2021-11-26 NOTE — Progress Notes (Signed)
Occupational Therapy Treatment ?Patient Details ?Name: [name] [name] ?MRN: [id number] ?DOB: [birth year] ?Today's Date: 11/26/2021 ? ? ?History of present illness Pt is a 70 y/o F admitted on 11/20/21 after presenting from [facility] for AKI.  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 2020, LLE DVT, anxiety, invasive papillary adenocarcinoma s/p lobectomy, radation, chemo ?  ?OT comments ? Upon entering the room, pt supine in bed and tearful. Pt reports, " I miss my husband but I think he is coming to visit me soon." OT able to redirect pt and she was agreeable to OT intervention. Pt performed bed mobility without assistance and pt standing with RW and supervision to stand at sink for grooming tasks. No physical assistance needed while standing. Pt returns to sitting on EOB and clean hospital gown obtained as PT enters room. Pt transitions easily to PT session as therapist exits the room. All needs within reach.   ? ?Recommendations for follow up therapy are one component of a multi-disciplinary discharge planning process, led by the attending physician.  Recommendations may be updated based on patient status, additional functional criteria and insurance authorization. ?   ?Follow Up Recommendations ? Home health OT  ?  ?Assistance Recommended at Discharge Intermittent Supervision/Assistance  ?Patient can return home with the following ? A little help with walking and/or transfers;A little help with bathing/dressing/bathroom;Assist for transportation;Help with stairs or ramp for entrance;Assistance with cooking/housework;Direct supervision/assist for financial management;Direct supervision/assist for medications management ?  ?Equipment Recommendations ? Other (comment) (RW)  ?  ?   ?Precautions / Restrictions Precautions ?Precautions: Fall  ? ? ?  ? ?Mobility Bed Mobility ?Overal bed mobility: Modified  Independent ?Bed Mobility: Supine to Sit ?  ?  ?  ?  ?  ?General bed mobility comments: no physical assistance provided ?  ? ?Transfers ?Overall transfer level: Needs assistance ?Equipment used: Rolling walker (2 wheels) ?Transfers: Sit to/from Stand ?Sit to Stand: Supervision ?  ?  ?  ?  ?  ?General transfer comment: cuing for technique and hand placement ?  ?  ?Balance Overall balance assessment: Needs assistance ?Sitting-balance support: Feet supported, Bilateral upper extremity supported ?Sitting balance-Leahy Scale: Good ?  ?  ?Standing balance support: During functional activity, No upper extremity supported ?Standing balance-Leahy Scale: Fair ?  ?  ?  ?  ?  ?  ?  ?  ?  ?  ?  ?  ?   ? ?ADL either performed or assessed with clinical judgement  ? ?ADL Overall ADL's : Needs assistance/impaired ?  ?  ?Grooming: Wash/dry hands;Wash/dry face;Oral care;Standing;Supervision/safety ?  ?  ?  ?  ?  ?  ?  ?  ?  ?  ?  ?  ?  ?  ?  ?  ?  ?  ? ?Extremity/Trunk Assessment Upper Extremity Assessment ?Upper Extremity Assessment: Generalized weakness;Overall [facility] for tasks assessed ?  ?Lower Extremity Assessment ?Lower Extremity Assessment: Generalized weakness;Overall [facility] for tasks assessed ?  ?Cervical / Trunk Assessment ?Cervical / Trunk Assessment: Normal ?  ? ?Vision Patient Visual Report: No change from baseline ?  ?  ?   ?   ? ?Cognition Arousal/Alertness: Awake/alert ?Behavior During Therapy: [facility] for tasks assessed/performed ?Overall Cognitive Status: History of cognitive impairments - at baseline ?  ?  ?  ?  ?  ?  ?  ?  ?  ?  ?  ?  ?  ?  ?  ?  ?  General Comments: Pt is tearful this morning but able to redirect. She is pleasant and cooperative throughout. ?  ?  ?   ?   ?   ?   ? ? ?Pertinent Vitals/ Pain       Pain Assessment ?Pain Assessment: No/denies pain ? ?   ?   ? ?Frequency ? Min 2X/week  ? ? ? ? ?  ?Progress Toward Goals ? ?OT Goals(current goals can now be found in the care plan section) ? Progress towards OT  goals: Progressing toward goals ? ?Acute Rehab OT Goals ?Patient Stated Goal: "To see my husband. I miss him. " ?OT Goal Formulation: With patient ?Time For Goal Achievement: 12/07/21 ?Potential to Achieve Goals: Good  ?Plan Discharge plan remains appropriate;Frequency remains appropriate   ? ?   ?AM-PAC OT "6 Clicks" Daily Activity     ?Outcome Measure ? ? Help from another person eating meals?: None ?Help from another person taking care of personal grooming?: None ?Help from another person toileting, which includes using toliet, bedpan, or urinal?: A Little ?Help from another person bathing (including washing, rinsing, drying)?: A Little ?Help from another person to put on and taking off regular upper body clothing?: None ?Help from another person to put on and taking off regular lower body clothing?: A Little ?6 Click Score: 21 ? ?  ?End of Session Equipment Utilized During Treatment: Rolling walker (2 wheels);Other (comment) (foley cath) ? ?OT Visit Diagnosis: Unsteadiness on feet (R26.81);Muscle weakness (generalized) (M62.81) ?  ?Activity Tolerance Patient tolerated treatment well ?  ?Patient Left in bed;with call bell/phone within reach;with bed alarm set ?  ?Nurse Communication Mobility status ?  ? ?   ? ?Time: 9373-4287 ?OT Time Calculation (min): 11 min ? ?Charges: OT General Charges ?$OT Visit: 1 Visit ?OT Treatments ?$Self Care/Home Management : 8-22 mins ? ?Darleen Crocker, MS, OTR/L , CBIS ?ascom [phone]  ?11/26/21, 12:42 PM  ?

## 2021-11-26 NOTE — TOC Initial Note (Signed)
Transition of Care (TOC) - Initial/Assessment Note  ? ? ?Patient Details  ?Name: [name] [name] ?MRN: [id number] ?Date of Birth: [birth year] ? ?Transition of Care (TOC) CM/SW Contact:    ?Beverly Sessions, RN ?Phone Number: ?11/26/2021, 11:09 AM ? ?Clinical Narrative:                 ? ?Admitted for: AKI ?Admitted from:Home with husband  ?PCP: Sabra Heck.  Husband transport to appointments  ?Current home health/prior home health/DME: [facility] ? ?Therapy recommending home health.  Patient A&O x2. Spoke with husband he is in agreement to home health. States he does not have a preference of agency. Referral made to [facility] with [facility].  Husband to transport at discharge   ? ?Expected Discharge Plan: [place] ?Barriers to Discharge: Barriers Resolved ? ? ?Patient Goals and CMS Choice ?  ?  ?Choice offered to / list presented to : Spouse ? ?Expected Discharge Plan and Services ?Expected Discharge Plan: [place] ?  ?  ?Post Acute Care Choice: Home Health ?Living arrangements for the past 2 months: [place] ?Expected Discharge Date: 11/26/21               ?  ?  ?  ?  ?  ?HH Arranged: PT, OT ?[place] Agency: [facility] ?Date HH Agency Contacted: 11/26/21 ?  ?Representative spoke with at [place]: Tommi Rumps ? ?Prior Living Arrangements/Services ?Living arrangements for the past 2 months: [place] ?Lives with:: Spouse ?Patient language and need for interpreter reviewed:: Yes ?Do you feel safe going back to the place where you live?: Yes      ?Need for Family Participation in Patient Care: Yes (Comment) ?Care giver support system in place?: Yes (comment) ?Current home services: DME ?Criminal Activity/Legal Involvement Pertinent to Current Situation/Hospitalization: No - Comment as needed ? ?Activities of Daily Living ?Home Assistive Devices/Equipment: Cane (specify quad or straight) ?ADL Screening (condition at time of admission) ?Patient's cognitive ability adequate to safely  complete daily activities?: Yes ?Is the patient deaf or have difficulty hearing?: No ?Does the patient have difficulty seeing, even when wearing glasses/contacts?: No ?Does the patient have difficulty concentrating, remembering, or making decisions?: No ?Patient able to express need for assistance with ADLs?: Yes ?Does the patient have difficulty dressing or bathing?: No ?Independently performs ADLs?: Yes (appropriate for developmental age) ?Does the patient have difficulty walking or climbing stairs?: No ?Weakness of Legs: None ?Weakness of Arms/Hands: None ? ?Permission Sought/Granted ?  ?  ?   ?   ?   ?   ? ?Emotional Assessment ?  ?  ?  ?  ?  ?  ? ?Admission diagnosis:  Hypocalcemia [E83.51] ?Hypomagnesemia [E83.42] ?High anion gap metabolic acidosis [W38.93] ?AKI (acute kidney injury) ([place]) [N17.9] ?Chest pain, unspecified type [R07.9] ?Patient Active Problem List  ? Diagnosis Date Noted  ? Left leg pain 11/21/2021  ? Hypocalcemia 11/21/2021  ? Acute metabolic encephalopathy 73/42/8768  ? AKI (acute kidney injury) ([place]) 11/20/2021  ? Hypothyroidism 11/20/2021  ? Neuropathy 11/20/2021  ? DVT (deep venous thrombosis) ([place]) 09/20/2018  ? Pulmonary emboli ([place]) 08/18/2018  ? Pulmonary embolism ([place]) 08/18/2018  ? 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 Pure hypercholesterolemia 02/01/2011  ? ?PCP:  Rusty Aus, MD ?Pharmacy:   ?[facility] - [place], [place] ?[address] ?[place] [address] ?Phone: [phone] Fax: [phone] ? ? ? ? ?  Social Determinants of Health (SDOH) Interventions ?  ? ?Readmission Risk Interventions ?   ? View : No data to display.  ?  ?  ?  ? ? ? ?

## 2021-11-26 NOTE — Progress Notes (Signed)
Discharge Note  ? ?Patient discharged home via private vehicle with husband and daughter. Patient's IV removed without difficulty, Foley removed as well. Patient tolerated procedure well. Patient and family educated on discharge packet, and they verbalized understanding of instructions.  ?

## 2021-11-26 NOTE — Progress Notes (Signed)
?[facility]  ?PROGRESS NOTE  ? ?Subjective:  ? ?Patient seen sitting up in bed, alert, oriented, smiling ?73% completed breakfast tray at bedside ?Patient states appetite is returning slowly ?Denies shortness of breath ?Complains of continued weakness and fatigue ? ?Objective:  ?Vital signs: ?Blood pressure (!) 148/71, pulse 82, temperature 99.4 ?F (37.4 ?C), temperature source Oral, resp. rate 18, height '5\' 6"'$  (1.676 m), weight 82.6 kg, SpO2 95 %. ? ?Intake/Output Summary (Last 24 hours) at 11/26/2021 1328 ?Last data filed at 11/26/2021 1100 ?Gross per 24 hour  ?Intake 3025.34 ml  ?Output 2050 ml  ?Net 975.34 ml  ? ? ?Filed Weights  ? 11/21/21 1357 11/25/21 2148  ?Weight: 84.7 kg 82.6 kg  ? ? ? ?Physical Exam: ?General:  No acute distress  ?Head:  Normocephalic, atraumatic. Moist oral mucosal membranes  ?Eyes:  Anicteric  ?Lungs:   Clear to auscultation, normal effort  ?Heart:  S1S2 no rubs  ?Abdomen:   Soft, nontender, bowel sounds present  ?Extremities: No peripheral edema.  ?Neurologic:  Awake, alert, following commands  ?Skin:  No lesions  ?Access: None  ? ? ?Basic Metabolic Panel: ?Recent Labs  ?Lab 11/20/21 ?1600 11/21/21 ?0320 11/22/21 ?7628 11/23/21 ?3151 11/24/21 ?7616 11/25/21 ?0737 11/26/21 ?0411  ?NA 137   < > 140 142 145 146* 145  ?K 4.0   < > 4.7 4.6 4.5 4.6 4.4  ?CL 105   < > 111 105 104 106 108  ?CO2 16*   < > 18* '25 30 30 27  '$ ?GLUCOSE 164*   < > 127* 116* 135* 113* 108*  ?BUN 120*   < > 135* 120* 102* 97* 82*  ?CREATININE 8.75*   < > 7.26* 6.23* 5.49* 5.05* 4.58*  ?CALCIUM 6.3*   < > 7.4* 7.2* 7.1* 7.4* 7.7*  ?MG 1.5*  --  1.7  --   --  1.5* 1.7  ?PHOS 5.7*  --   --   --   --  4.3 4.1  ? < > = values in this interval not displayed.  ? ? ? ?CBC: ?Recent Labs  ?Lab 11/22/21 ?1062 11/23/21 ?0615 11/24/21 ?6948 11/25/21 ?5462 11/26/21 ?0411  ?WBC 8.1 7.7 8.0 6.6 6.3  ?HGB 12.3 11.8* 11.5* 11.2* 10.8*  ?HCT 37.3 35.5* 35.2* 35.0* 34.4*  ?MCV 83.4 82.2 83.2 84.5 86.0  ?PLT 167 170 150 144* 140*   ? ? ? ? ?Urinalysis: ?No results for input(s): COLORURINE, LABSPEC, [place], GLUCOSEU, HGBUR, BILIRUBINUR, KETONESUR, PROTEINUR, UROBILINOGEN, NITRITE, LEUKOCYTESUR in the last 72 hours. ? ?Invalid input(s): APPERANCEUR  ? ? ?Imaging: ?No results found. ? ? ?Medications:  ? ? lactated ringers 100 mL/hr at 11/26/21 0532  ? ? apixaban  5 mg Oral BID  ? Chlorhexidine Gluconate Cloth  6 each Topical Q0600  ? cholecalciferol  5,000 Units Oral q morning  ? gabapentin  600 mg Oral QHS  ? levothyroxine  125 mcg Oral Q0600  ? pantoprazole  40 mg Oral Daily  ? simvastatin  20 mg Oral q1800  ? ? ?Assessment/ Plan:  ?   ?71 year old female with history of hypertension, hyperlipidemia, GERD, restless leg syndrome, history of angios sarcoma, history of pulmonary embolism, left lower extremity DVT, history of papillary adenocarcinoma s/p lobectomy now admitted with history of severe hypocalcemia, hypomagnesemia and acute kidney injury. ?  ?Principal Problem: ?  AKI (acute kidney injury) ([place]) ?Active Problems: ?  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  Pulmonary embolism ([place]) ?  Pure hypercholesterolemia ?  DVT (deep venous thrombosis) ([place]) ?  Hypothyroidism ?  Neuropathy ?  Left leg pain ?  Hypocalcemia ?  Acute metabolic encephalopathy ?  ?#1: Acute kidney injury: Acute kidney injury is most likely secondary to severe prerenal azotemia.  Renal function continues to improve slowly.  Urine output 4 L recorded in preceding 24 hours.  IV fluids remain in place, appetite continues to improve.  We will schedule follow-up appointment with our office at discharge. ?  ?#2: Acute metabolic acidosis: Resolved with bicarb drip ? ?#3: Anemia: Hemoglobin 10.8, will continue to monitor ? ?#4: Hypomagnesemia: Improved with supplementation ?  ? ? ? LOS: 73 ?[place] ?[facility] ?5/1/20231:28 PM ?  ?

## 2021-11-26 NOTE — Progress Notes (Signed)
Physical Therapy Treatment ?Patient Details ?Name: [name] [name] ?MRN: [id number] ?DOB: 1951/03/02 ?Today's Date: 11/26/2021 ? ? ?History of Present Illness Pt is a 71 y/o F admitted on 11/20/21 after presenting from [facility] for AKI.  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 2020, LLE DVT, anxiety, invasive papillary adenocarcinoma s/p lobectomy, radation, chemo ? ?  ?PT Comments  ? ? Pt agreeable to session. RW used and pt able to ambulate around RN station with safe technique. Pt eager to go home this date. Still requires cues for sequencing. At this time, continue to recommend use of RW for safety. Will continue to progress.   ?Recommendations for follow up therapy are one component of a multi-disciplinary discharge planning process, led by the attending physician.  Recommendations may be updated based on patient status, additional functional criteria and insurance authorization. ? ?Follow Up Recommendations ? Home health PT ?  ?  ?Assistance Recommended at Discharge Frequent or constant Supervision/Assistance  ?Patient can return home with the following A little help with walking and/or transfers;A little help with bathing/dressing/bathroom;Assistance with cooking/housework;Direct supervision/assist for financial management;Assist for transportation;Direct supervision/assist for medications management ?  ?Equipment Recommendations ? None recommended by PT  ?  ?Recommendations for Other Services   ? ? ?  ?Precautions / Restrictions Precautions ?Precautions: Fall ?Restrictions ?Weight Bearing Restrictions: No  ?  ? ?Mobility ? Bed Mobility ?  ?  ?  ?  ?  ?  ?  ?General bed mobility comments: received seated on EOB ?  ? ?Transfers ?Overall transfer level: Needs assistance ?Equipment used: Rolling walker (2 wheels) ?Transfers: Sit to/from Stand ?Sit to Stand: Supervision ?  ?  ?  ?  ?  ?General  transfer comment: safe technique ?  ? ?Ambulation/Gait ?Ambulation/Gait assistance: Min guard ?Gait Distance (Feet): 200 Feet ?Assistive device: Rolling walker (2 wheels) ?Gait Pattern/deviations: Step-through pattern ?  ?  ?  ?General Gait Details: ambulated around RN station with cues for obstacle avoidance and sequencing. RW used with safe technique ? ? ?Stairs ?  ?  ?  ?  ?  ? ? ?Wheelchair Mobility ?  ? ?Modified Rankin (Stroke Patients Only) ?  ? ? ?  ?Balance Overall balance assessment: Needs assistance ?Sitting-balance support: Feet supported, Bilateral upper extremity supported ?Sitting balance-Leahy Scale: Good ?  ?  ?Standing balance support: During functional activity, No upper extremity supported ?Standing balance-Leahy Scale: Fair ?  ?  ?  ?  ?  ?  ?  ?  ?  ?  ?  ?  ?  ? ?  ?Cognition Arousal/Alertness: Awake/alert ?Behavior During Therapy: [facility] for tasks assessed/performed ?Overall Cognitive Status: History of cognitive impairments - at baseline ?  ?  ?  ?  ?  ?  ?  ?  ?  ?  ?  ?  ?  ?  ?  ?  ?General Comments: pleasant and agreeable to session this date ?  ?  ? ?  ?Exercises Other Exercises ?Other Exercises: pt able to sit at EOB and take comb to brush hair. Able to maintain balance throughout. Needed therapist assist for post section of hair ? ?  ?General Comments   ?  ?  ? ?Pertinent Vitals/Pain Pain Assessment ?Pain Assessment: No/denies pain  ? ? ?Home Living   ?  ?  ?  ?  ?  ?  ?  ?  ?  ?   ?  ?  Prior Function    ?  ?  ?   ? ?PT Goals (current goals can now be found in the care plan section) Acute Rehab PT Goals ?Patient Stated Goal: get better ?PT Goal Formulation: With patient/family ?Time For Goal Achievement: 12/07/21 ?Potential to Achieve Goals: Good ?Progress towards PT goals: Progressing toward goals ? ?  ?Frequency ? ? ? Min 2X/week ? ? ? ?  ?PT Plan Current plan remains appropriate  ? ? ?Co-evaluation   ?  ?  ?  ?  ? ?  ?AM-PAC PT "6 Clicks" Mobility   ?Outcome Measure ? Help needed  turning from your back to your side while in a flat bed without using bedrails?: None ?Help needed moving from lying on your back to sitting on the side of a flat bed without using bedrails?: None ?Help needed moving to and from a bed to a chair (including a wheelchair)?: A Little ?Help needed standing up from a chair using your arms (e.g., wheelchair or bedside chair)?: A Little ?Help needed to walk in hospital room?: A Little ?Help needed climbing 3-5 steps with a railing? : A Little ?6 Click Score: 20 ? ?  ?End of Session Equipment Utilized During Treatment: Gait belt ?Activity Tolerance: Patient tolerated treatment well ?Patient left: in chair;with chair alarm set ?Nurse Communication: Mobility status ?PT Visit Diagnosis: Muscle weakness (generalized) (M62.81);Unsteadiness on feet (R26.81) ?  ? ? ?Time: 5188-4166 ?PT Time Calculation (min) (ACUTE ONLY): 23 min ? ?Charges:  $Gait Training: 23-37 mins          ?          ? ?Greggory Stallion, PT, DPT, GCS ?[phone] ? ? ? ?[name] [name] ?11/26/2021, 10:53 AM ? ?

## 2022-01-23 ENCOUNTER — Emergency Department: Payer: Medicare Other

## 2022-01-23 ENCOUNTER — Emergency Department
Admission: EM | Admit: 2022-01-23 | Discharge: 2022-01-23 | Discharge status: Against Medical Advice | Payer: Medicare Other | Attending: Emergency Medicine | Admitting: Emergency Medicine

## 2022-01-23 ENCOUNTER — Other Ambulatory Visit: Payer: Self-pay

## 2022-01-23 DIAGNOSIS — Z5321 Procedure and treatment not carried out due to patient leaving prior to being seen by health care provider: Secondary | ICD-10-CM | POA: Diagnosis not present

## 2022-01-23 DIAGNOSIS — Y900 Blood alcohol level of less than 20 mg/100 ml: Secondary | ICD-10-CM | POA: Insufficient documentation

## 2022-01-23 DIAGNOSIS — R200 Anesthesia of skin: Secondary | ICD-10-CM | POA: Insufficient documentation

## 2022-01-23 DIAGNOSIS — R42 Dizziness and giddiness: Secondary | ICD-10-CM | POA: Diagnosis present

## 2022-01-23 LAB — COMPREHENSIVE METABOLIC PANEL
ALT: 12 U/L (ref 0–44)
AST: 19 U/L (ref 15–41)
Albumin: 3.7 g/dL (ref 3.5–5.0)
Alkaline Phosphatase: 71 U/L (ref 38–126)
Anion gap: 11 (ref 5–15)
BUN: 25 mg/dL — ABNORMAL HIGH (ref 8–23)
CO2: 23 mmol/L (ref 22–32)
Calcium: 8.9 mg/dL (ref 8.9–10.3)
Chloride: 109 mmol/L (ref 98–111)
Creatinine, Ser: 1.73 mg/dL — ABNORMAL HIGH (ref 0.44–1.00)
GFR, Estimated: 31 mL/min — ABNORMAL LOW (ref >60)
Glucose, Bld: 202 mg/dL — ABNORMAL HIGH (ref 70–99)
Potassium: 3.6 mmol/L (ref 3.5–5.1)
Sodium: 143 mmol/L (ref 135–145)
Total Bilirubin: 0.5 mg/dL (ref 0.3–1.2)
Total Protein: 7 g/dL (ref 6.5–8.1)

## 2022-01-23 LAB — DIFFERENTIAL
Abs Immature Granulocytes: 0.02 10*3/uL (ref 0.00–0.07)
Basophils Absolute: 0 10*3/uL (ref 0.0–0.1)
Basophils Relative: 1 %
Eosinophils Absolute: 0.1 10*3/uL (ref 0.0–0.5)
Eosinophils Relative: 1 %
Immature Granulocytes: 0 %
Lymphocytes Relative: 17 %
Lymphs Abs: 0.9 10*3/uL (ref 0.7–4.0)
Monocytes Absolute: 0.3 10*3/uL (ref 0.1–1.0)
Monocytes Relative: 5 %
Neutro Abs: 4.3 10*3/uL (ref 1.7–7.7)
Neutrophils Relative %: 76 %

## 2022-01-23 LAB — CBC
HCT: 36.4 % (ref 36.0–46.0)
Hemoglobin: 11.7 g/dL — ABNORMAL LOW (ref 12.0–15.0)
MCH: 28.5 pg (ref 26.0–34.0)
MCHC: 32.1 g/dL (ref 30.0–36.0)
MCV: 88.6 fL (ref 80.0–100.0)
Platelets: 166 10*3/uL (ref 150–400)
RBC: 4.11 MIL/uL (ref 3.87–5.11)
RDW: 15.3 % (ref 11.5–15.5)
WBC: 5.6 10*3/uL (ref 4.0–10.5)
nRBC: 0 % (ref 0.0–0.2)

## 2022-01-23 LAB — APTT: aPTT: 27 seconds (ref 24–36)

## 2022-01-23 LAB — PROTIME-INR
INR: 1 (ref 0.8–1.2)
Prothrombin Time: 13.4 seconds (ref 11.4–15.2)

## 2022-01-23 LAB — ETHANOL: Alcohol, Ethyl (B): <10 mg/dL (ref <10)

## 2022-01-23 NOTE — ED Triage Notes (Signed)
Pt arrives with c/o bilateral pinky numbness that started about 45 minutes ago. Per pt, she a sudden onset of dizziness and the numbness in bilateral pinkys. Pt a&ox4.

## 2022-02-05 ENCOUNTER — Encounter: Payer: Self-pay | Admitting: Emergency Medicine

## 2022-02-05 ENCOUNTER — Emergency Department: Payer: Medicare Other

## 2022-02-05 ENCOUNTER — Observation Stay
Admission: EM | Admit: 2022-02-05 | Discharge: 2022-02-07 | Disposition: A | Discharge status: Home / Self Care | Payer: Medicare Other | Attending: Internal Medicine | Admitting: Internal Medicine

## 2022-02-05 ENCOUNTER — Other Ambulatory Visit: Payer: Self-pay

## 2022-02-05 DIAGNOSIS — R2681 Unsteadiness on feet: Secondary | ICD-10-CM | POA: Diagnosis not present

## 2022-02-05 DIAGNOSIS — I2699 Other pulmonary embolism without acute cor pulmonale: Secondary | ICD-10-CM | POA: Diagnosis present

## 2022-02-05 DIAGNOSIS — K219 Gastro-esophageal reflux disease without esophagitis: Secondary | ICD-10-CM

## 2022-02-05 DIAGNOSIS — F419 Anxiety disorder, unspecified: Secondary | ICD-10-CM

## 2022-02-05 DIAGNOSIS — Z79899 Other long term (current) drug therapy: Secondary | ICD-10-CM | POA: Insufficient documentation

## 2022-02-05 DIAGNOSIS — I639 Cerebral infarction, unspecified: Secondary | ICD-10-CM | POA: Diagnosis not present

## 2022-02-05 DIAGNOSIS — N1832 Chronic kidney disease, stage 3b: Secondary | ICD-10-CM | POA: Insufficient documentation

## 2022-02-05 DIAGNOSIS — Z7901 Long term (current) use of anticoagulants: Secondary | ICD-10-CM | POA: Insufficient documentation

## 2022-02-05 DIAGNOSIS — G459 Transient cerebral ischemic attack, unspecified: Secondary | ICD-10-CM | POA: Insufficient documentation

## 2022-02-05 DIAGNOSIS — Z86718 Personal history of other venous thrombosis and embolism: Secondary | ICD-10-CM | POA: Diagnosis not present

## 2022-02-05 DIAGNOSIS — R41841 Cognitive communication deficit: Secondary | ICD-10-CM | POA: Insufficient documentation

## 2022-02-05 DIAGNOSIS — R531 Weakness: Secondary | ICD-10-CM | POA: Diagnosis present

## 2022-02-05 DIAGNOSIS — N179 Acute kidney failure, unspecified: Secondary | ICD-10-CM | POA: Diagnosis present

## 2022-02-05 DIAGNOSIS — Z86711 Personal history of pulmonary embolism: Secondary | ICD-10-CM | POA: Diagnosis not present

## 2022-02-05 DIAGNOSIS — M6281 Muscle weakness (generalized): Secondary | ICD-10-CM | POA: Insufficient documentation

## 2022-02-05 DIAGNOSIS — R4701 Aphasia: Secondary | ICD-10-CM | POA: Diagnosis not present

## 2022-02-05 DIAGNOSIS — E782 Mixed hyperlipidemia: Secondary | ICD-10-CM

## 2022-02-05 DIAGNOSIS — Z853 Personal history of malignant neoplasm of breast: Secondary | ICD-10-CM | POA: Diagnosis not present

## 2022-02-05 DIAGNOSIS — E039 Hypothyroidism, unspecified: Secondary | ICD-10-CM | POA: Diagnosis not present

## 2022-02-05 DIAGNOSIS — Z823 Family history of stroke: Secondary | ICD-10-CM | POA: Diagnosis not present

## 2022-02-05 DIAGNOSIS — I82409 Acute embolism and thrombosis of unspecified deep veins of unspecified lower extremity: Secondary | ICD-10-CM | POA: Diagnosis present

## 2022-02-05 LAB — COMPREHENSIVE METABOLIC PANEL
ALT: 13 U/L (ref 0–44)
AST: 18 U/L (ref 15–41)
Albumin: 3.7 g/dL (ref 3.5–5.0)
Alkaline Phosphatase: 69 U/L (ref 38–126)
Anion gap: 9 (ref 5–15)
BUN: 27 mg/dL — ABNORMAL HIGH (ref 8–23)
CO2: 22 mmol/L (ref 22–32)
Calcium: 9.1 mg/dL (ref 8.9–10.3)
Chloride: 113 mmol/L — ABNORMAL HIGH (ref 98–111)
Creatinine, Ser: 1.61 mg/dL — ABNORMAL HIGH (ref 0.44–1.00)
GFR, Estimated: 34 mL/min — ABNORMAL LOW (ref >60)
Glucose, Bld: 131 mg/dL — ABNORMAL HIGH (ref 70–99)
Potassium: 3.6 mmol/L (ref 3.5–5.1)
Sodium: 144 mmol/L (ref 135–145)
Total Bilirubin: 0.5 mg/dL (ref 0.3–1.2)
Total Protein: 6.9 g/dL (ref 6.5–8.1)

## 2022-02-05 LAB — ETHANOL: Alcohol, Ethyl (B): <10 mg/dL (ref <10)

## 2022-02-05 LAB — APTT: aPTT: 30 seconds (ref 24–36)

## 2022-02-05 LAB — CBC
HCT: 36.9 % (ref 36.0–46.0)
Hemoglobin: 12.2 g/dL (ref 12.0–15.0)
MCH: 28.8 pg (ref 26.0–34.0)
MCHC: 33.1 g/dL (ref 30.0–36.0)
MCV: 87.2 fL (ref 80.0–100.0)
Platelets: 187 10*3/uL (ref 150–400)
RBC: 4.23 MIL/uL (ref 3.87–5.11)
RDW: 14.7 % (ref 11.5–15.5)
WBC: 5.8 10*3/uL (ref 4.0–10.5)
nRBC: 0 % (ref 0.0–0.2)

## 2022-02-05 LAB — DIFFERENTIAL
Abs Immature Granulocytes: 0.03 10*3/uL (ref 0.00–0.07)
Basophils Absolute: 0.1 10*3/uL (ref 0.0–0.1)
Basophils Relative: 1 %
Eosinophils Absolute: 0.1 10*3/uL (ref 0.0–0.5)
Eosinophils Relative: 2 %
Immature Granulocytes: 1 %
Lymphocytes Relative: 23 %
Lymphs Abs: 1.3 10*3/uL (ref 0.7–4.0)
Monocytes Absolute: 0.4 10*3/uL (ref 0.1–1.0)
Monocytes Relative: 7 %
Neutro Abs: 3.9 10*3/uL (ref 1.7–7.7)
Neutrophils Relative %: 66 %

## 2022-02-05 LAB — PROTIME-INR
INR: 1 (ref 0.8–1.2)
Prothrombin Time: 13.5 seconds (ref 11.4–15.2)

## 2022-02-05 MED ORDER — ASPIRIN 300 MG RE SUPP
300.0000 mg | Freq: Once | RECTAL | Status: DC
  Filled 2022-02-05: qty 1

## 2022-02-05 MED ORDER — IOHEXOL 350 MG/ML SOLN
100.0000 mL | Freq: Once | INTRAVENOUS | Status: AC | PRN
  Administered 2022-02-05: 100 mL via INTRAVENOUS

## 2022-02-05 MED ORDER — ACETAMINOPHEN 650 MG RE SUPP
650.0000 mg | RECTAL | Status: AC
  Filled 2022-02-05: qty 2

## 2022-02-05 MED ORDER — SODIUM CHLORIDE 0.9% FLUSH: 3.0000 mL | Freq: Once | INTRAVENOUS | Status: DC

## 2022-02-05 NOTE — H&P (Signed)
History and Physical    Patient: [name] [name] ACZ:[id number] DOB: Sep 11, 1950 DOA: 02/05/2022 DOS: the patient was seen and examined on 02/06/2022 PCP: Rusty Aus, MD  Patient coming from: Home  Chief Complaint:  Chief Complaint  Patient presents with   Code Stroke   HPI: [name] [name] is a 71 y.o. female with medical history significant of hyperlipidemia, GERD, neuropathy, RLS, history of acute saddle pulmonary embolism with acute cor pulmonale (January 2020), LLE DVT on Eliquis, insomnia who presented to the emergency department via EMS from home due to sudden onset of right-sided weakness.  Patient was unable to provide history due to aphasia, history was obtained from husband and from ED physician and ED medical record.  Per report, patient had an argument with her husband around 8 PM today and then noted that her voice became slurred and she also complained of right-sided facial droop, right upper and lower extremity weakness with a weak right hand grip.  She denies fever, recent trauma, shortness of breath.  EMS was activated and patient was sent to the ED for further evaluation and management.   ED Course:  In the emergency department, she was tachypneic, BP was 159/97, but other vital signs were within normal range.  Work-up in the ED showed normal CBC, BMP showed hyperglycemia, BUN/creatinine 27/1.61 (creatinine was 1.73 on 01/15/2022), alcohol level was less than 10. CT angiography of head and neck showed negative CTA for large vessel occlusion or other emergent finding. CT head without contrast showed no acute intracranial abnormality Aspirin 300 mg was given.  Teleneurology was consulted and was concerned of left hemispheric stroke syndrome.  Admission of patient for MRI without contrast to rule out acute ischemia neuro follow-up was recommended.  Hospitalist was asked to admit patient for further evaluation and management.  Review of Systems: Review of systems as noted in the  HPI. All other systems reviewed and are negative.   Past Medical History:  Diagnosis Date   Anxiety    Cancer ([place])    2006 right side breast ca. lumpectomy, skin ca on same breast    GERD (gastroesophageal reflux disease)    Hypercholesteremia    Peripheral neuropathic pain    Personal history of chemotherapy    2006/2014   Personal history of radiation therapy    2006/2014   Pulmonary emboli [facility])    Urinary incontinence    Past Surgical History:  Procedure Laterality Date   APPENDECTOMY     CARDIAC CATHETERIZATION Left 05/23/2016   Procedure: Left Heart Cath and Coronary Angiography;  Surgeon: Isaias Cowman, MD;  Location: [place] [facility];  Service: Cardiovascular;  Laterality: Left;   IVC FILTER INSERTION N/A 08/19/2018   Procedure: IVC FILTER INSERTION;  Surgeon: Algernon Huxley, MD;  Location: [facility];  Service: Cardiovascular;  Laterality: N/A;   MASTECTOMY Right 2006/2014   rt breast removal     cancer    Social History:  reports that she has never smoked. She has never used smokeless tobacco. She reports that she does not drink alcohol and does not use drugs.   No Known Allergies  Family History  Problem Relation Age of Onset   Asthma Mother    COPD Mother    CVA Father    Heart attack Father    Coronary artery disease Father    Schizophrenia Brother    Peripheral vascular disease Brother    Breast cancer Neg Hx      Prior  to Admission medications   Medication Sig Start Date End Date Taking? Authorizing Provider  acetaminophen (TYLENOL) 325 MG tablet Take 325 mg by mouth every 4 (four) hours as needed.    [provider]  apixaban (ELIQUIS) 5 MG TABS tablet Take 1 tablet (5 mg total) by mouth 2 (two) times daily. 11/26/21   Jennye Boroughs, MD  Cholecalciferol 125 MCG (5000 UT) TABS Take by mouth every morning.     [provider]  Cyanocobalamin 500 MCG TBDP Take by mouth every morning.     [provider]   fluticasone (FLONASE) 50 MCG/ACT nasal spray Place 2 sprays into both nostrils daily.    [provider]  gabapentin (NEURONTIN) 300 MG capsule Take 900 mg by mouth at bedtime.    [provider]  levothyroxine (SYNTHROID, LEVOTHROID) 125 MCG tablet Take 125 mcg by mouth.  12/19/17 11/20/21  [provider]  LORazepam (ATIVAN) 1 MG tablet Take 1 mg by mouth at bedtime.    [provider]  omeprazole (PRILOSEC) 20 MG capsule Take 20 mg by mouth daily.    [provider]  simvastatin (ZOCOR) 20 MG tablet Take 20 mg by mouth daily at 6 PM.    [provider]  traZODone (DESYREL) 50 MG tablet Take 50 mg by mouth at bedtime. 10/16/21   [provider]    Physical Exam: BP (!) 172/84   Pulse 85   Temp 97.9 F (36.6 C) (Oral)   Resp (!) 65   SpO2 97%   General: 71 y.o. year-old female well developed well nourished in no acute distress.  Alert and oriented x3. HEENT: NCAT, EOMI Neck: Supple, trachea medial Cardiovascular: Regular rate and rhythm with no rubs or gallops.  No thyromegaly or JVD noted.  No lower extremity edema. 2/4 pulses in all 4 extremities. Respiratory: Clear to auscultation with no wheezes or rales. Good inspiratory effort. Abdomen: Soft, nontender nondistended with normal bowel sounds x4 quadrants. Muskuloskeletal: No cyanosis, clubbing or edema noted bilaterally Neuro: CN II-XII intact, strength 5/5 in LUE, 3/5 on RUE.  Heel-to-shin motion intact bilaterally.  Aphasia positive. Sensation, reflexes intact Skin: No ulcerative lesions noted or rashes Psychiatry: Judgement and insight appear normal. Mood is appropriate for condition and setting          Labs on Admission:  Basic Metabolic Panel: Recent Labs  Lab 02/05/22 2120  NA 144  K 3.6  CL 113*  CO2 22  GLUCOSE 131*  BUN 27*  CREATININE 1.61*  CALCIUM 9.1   Liver Function Tests: Recent Labs  Lab 02/05/22 2120  AST 18  ALT 13  ALKPHOS 69   BILITOT 0.5  PROT 6.9  ALBUMIN 3.7   No results for input(s): "LIPASE", "AMYLASE" in the last 168 hours. No results for input(s): "AMMONIA" in the last 168 hours. CBC: Recent Labs  Lab 02/05/22 2120  WBC 5.8  NEUTROABS 3.9  HGB 12.2  HCT 36.9  MCV 87.2  PLT 187   Cardiac Enzymes: No results for input(s): "CKTOTAL", "CKMB", "CKMBINDEX", "TROPONINI" in the last 168 hours.  BNP (last 3 results) No results for input(s): "BNP" in the last 8760 hours.  ProBNP (last 3 results) No results for input(s): "PROBNP" in the last 8760 hours.  CBG: No results for input(s): "GLUCAP" in the last 168 hours.  Radiological Exams on Admission: CT ANGIO HEAD NECK W WO CM W PERF (CODE STROKE)  Result Date: 02/05/2022 CLINICAL DATA:  Initial evaluation for neuro deficit,  stroke. EXAM: CT ANGIOGRAPHY HEAD AND NECK CT PERFUSION BRAIN TECHNIQUE: Multidetector CT imaging of the head and neck was performed using the standard protocol during bolus administration of intravenous contrast. Multiplanar CT image reconstructions and MIPs were obtained to evaluate the vascular anatomy. Carotid stenosis measurements (when applicable) are obtained utilizing NASCET criteria, using the distal internal carotid diameter as the denominator. Multiphase CT imaging of the brain was performed following IV bolus contrast injection. Subsequent parametric perfusion maps were calculated using RAPID software. RADIATION DOSE REDUCTION: This exam was performed according to the departmental dose-optimization program which includes automated exposure control, adjustment of the mA and/or kV according to patient size and/or use of iterative reconstruction technique. CONTRAST:  134mL OMNIPAQUE IOHEXOL 350 MG/ML SOLN COMPARISON:  Prior head CT from earlier the same day. FINDINGS: CTA NECK FINDINGS Aortic arch: Visualized aortic arch normal caliber with standard 3 vessel morphology. No stenosis about the origin of the great vessels. Extensive  atheromatous irregularity with moderate to severe multifocal stenoses noted involving the right subclavian artery beyond the takeoff of the right vertebral artery (series 6, image 105). Superimposed 4 mm outpouching could reflect a penetrating plaque versus small pseudoaneurysm (series 7, image 135). Right carotid system: Right CCA patent without stenosis. Mild calcified plaque about the right carotid bulb without hemodynamically significant greater than 50% stenosis. Right ICA patent distally without stenosis or dissection. Left carotid system: Left CCA patent without stenosis. Mild atheromatous change about the left carotid bulb without hemodynamically significant greater than 50% stenosis. Left ICA patent distally without stenosis or dissection. Vertebral arteries: Both vertebral arteries arise from the subclavian arteries. No proximal subclavian artery stenosis. Left vertebral artery dominant. Vertebral arteries patent without stenosis or dissection. Skeleton: No discrete or worrisome osseous lesions. Other neck: No other acute soft tissue abnormality within the neck. Right thyroid lobe hypoplastic. Upper chest: Irregular pleuroparenchymal thickening with scarring present at the anterolateral right lung. Persistent irregular loculated collection within this area measuring up to approximate 4 cm, similar to prior. Prior right mastectomy.//Cardiomegaly partially visualized. Few small right pulmonary nodules measuring up to 5 mm partially visualized, grossly similar to previous, indeterminate. Review of the MIP images confirms the above findings CTA HEAD FINDINGS Anterior circulation: Both internal carotid arteries patent to the termini without stenosis or other abnormality. A1 segments, anterior communicating artery complex, and anterior cerebral arteries widely patent. No M1 stenosis or occlusion. No proximal MCA branch occlusion. Distal MCA branches perfused and symmetric. Posterior circulation: Both vertebral  arteries patent without stenosis. Left vertebral artery dominant. Both PICA patent. Basilar patent to its distal aspect without stenosis. Superior cerebellar and posterior cerebral arteries patent bilaterally. Venous sinuses: Patent allowing for timing the contrast bolus. Anatomic variants: As above.  No aneurysm. Review of the MIP images confirms the above findings CT Brain Perfusion Findings: ASPECTS: 10 CBF (<30%) Volume: 80mL Perfusion (Tmax>6.0s) volume: 69mL Mismatch Volume: 18mL Infarction Location:Negative CT perfusion with no evidence for acute ischemia or other perfusion deficit. IMPRESSION: 1. Negative CTA for large vessel occlusion or other emergent finding. 2. Negative CT perfusion with no evidence for acute ischemia or other perfusion deficit. 3. 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 Superimposed 4 mm outpouching could reflect a penetrating plaque versus small pseudoaneurysm. 4. Additional mild atherosclerotic change elsewhere within the major arterial vasculature of the head and neck. No other hemodynamically significant or correctable stenosis. 5. Chronic pulmonary findings as detailed above, similar as compared  to prior CT from 08/22/2021. Results communicated by telephone at the time of interpretation on 02/05/2022 at approximately 9:15 p.m. to provider PHILLIP STAFFORD , who verbally acknowledged these results. Electronically Signed   By: Jeannine Boga M.D.   On: 02/05/2022 21:41   CT HEAD CODE STROKE WO CONTRAST  Result Date: 02/05/2022 CLINICAL DATA:  Code stroke. Initial evaluation for neuro deficit, stroke suspected. EXAM: CT HEAD WITHOUT CONTRAST TECHNIQUE: Contiguous axial images were obtained from the base of the skull through the vertex without intravenous contrast. RADIATION DOSE REDUCTION: This exam was performed according to the departmental dose-optimization program which includes automated  exposure control, adjustment of the mA and/or kV according to patient size and/or use of iterative reconstruction technique. COMPARISON:  None Available. FINDINGS: Brain: Age-related cerebral atrophy with chronic small vessel ischemic disease. No acute intracranial hemorrhage. No visible large vessel territory infarct. No mass lesion, mass effect or midline shift. No hydrocephalus or extra-axial fluid collection. Vascular: No hyperdense vessel. Skull: Scalp soft tissues and calvarium within normal limits. Sinuses/Orbits: Globes and orbital soft tissues within normal limits. Paranasal sinuses are largely clear. No mastoid effusion. Other: None. ASPECTS [facility] Stroke Program Early CT Score) - Ganglionic level infarction (caudate, lentiform nuclei, internal capsule, insula, M1-M3 cortex): 7 - Supraganglionic infarction (M4-M6 cortex): 3 Total score (0-10 with 10 being normal): 10 IMPRESSION: 1. No acute intracranial abnormality. 2. ASPECTS is 10. 3. Underlying atrophy with chronic small vessel ischemic disease. Critical Value/emergent results were called by telephone at the time of interpretation on 02/05/2022 at 9:20 pm to provider PHILLIP STAFFORD , who verbally acknowledged these results. Electronically Signed   By: Jeannine Boga M.D.   On: 02/05/2022 21:23    EKG: I independently viewed the EKG done and my findings are as followed: Normal sinus rhythm at a rate of 86 bpm with RBBB.  Assessment/Plan Present on Admission:  Acute ischemic stroke ([place])  AKI (acute kidney injury) ([place])  DVT (deep venous thrombosis) ([place])  Pulmonary embolism ([facility])  Principal Problem:   Acute ischemic stroke ([place]) Active Problems:   Pulmonary embolism ([facility])   DVT (deep venous thrombosis) ([facility])   AKI (acute kidney injury) ([place])   Acquired hypothyroidism   Mixed hyperlipidemia   GERD (gastroesophageal reflux disease)   Anxiety  Acute ischemic stroke Patient will be admitted to telemetry unit  CT angiography  of head and neck showed negative CTA for large vessel occlusion or other emergent finding. Echocardiogram in the morning MRI of brain without contrast in the morning Continue aspirin and statin Continue fall precautions and neuro checks Lipid panel and hemoglobin A1c will be checked Continue PT/SLP/OT eval and treat Bedside swallow eval by nursing prior to diet Neurology will be consulted and we shall await further recommendation   Acute kidney injury BUN/creatinine 27/1.61 (creatinine was 1.73 on 01/15/2022) Patient appeared to have been in prior ESRD, EGFR has improved to 34 (this was 10,  2 months ago) Renally adjust medications, avoid nephrotoxic agents/dehydration/hypotension  Mixed hyperlipidemia Continue Zocor  Hypothyroidism Continue Synthroid  GERD Continue Protonix  Pulmonary embolism Eliquis will be temporarily held at this time per neurology recommendation  Anxiety Continue Ativan  DVT prophylaxis: SCDs  Code Status: Full code  Consults: Neurology  Family Communication: Husband at bedside (all questions answered to satisfaction)  Severity of Illness: The appropriate patient status for this patient is OBSERVATION. Observation status is judged to be reasonable and necessary in order to provide the required intensity of service to ensure the  patient's safety. The patient's presenting symptoms, physical exam findings, and initial radiographic and laboratory data in the context of their medical condition is felt to place them at decreased risk for further clinical deterioration. Furthermore, it is anticipated that the patient will be medically stable for discharge from the hospital within 2 midnights of admission.   Author: Bernadette Hoit, DO 02/06/2022 1:00 AM  For on call review [email or web address].

## 2022-02-05 NOTE — ED Notes (Signed)
[place] [phone]

## 2022-02-05 NOTE — Consult Note (Signed)
[facility] TeleNeurology Consult Services   Patient Name:   [name] [name], [name] [name] Date of Birth:   1950-08-28 Identification Number:   MRN - [id number] Date of Service:   02/05/2022 21:17:45  Diagnosis:       I63.9 - Cerebrovascular accident (CVA), unspecified mechanism ([place])  Impression:      Patient presents with aphasia and right-sided weakness. Concern for left hemispheric stroke syndrome. Denies headache or migrainous symptoms. CT head and CT angiogram and perfusion showed no acute abnormalities. Recommend admission to the hospital for MRI brain without contrast to rule out acute ischemia and neuro follow-up.  Our recommendations are outlined below.  Recommendations:        Stroke/Telemetry Floor       Neuro Checks       Bedside Swallow Eval       DVT Prophylaxis       IV Fluids, Normal Saline       Head of Bed 30 Degrees       Euglycemia and Avoid Hyperthermia (PRN Acetaminophen)       Hold Anticoagulation for Now       Initiate or continue Aspirin 81 MG daily       Toxic/metabolic/infx workup per ED including UA, UDS       MRI brain without IV contrast       If no cerebral blood vessel imaging done in ED (such as CTA), recommend MRA head/neck without contrast, or carotid ultrasound if cannot obtain MRA       TSH, A1c, lipid profile       Transthoracic echocardiogram       Continuous telemetry       Physical, occupational, and speech therapies       q4h neuro checks/NIHSS       NPO until bedside swallow       Neurology follow-up   Sign Out:       Discussed with Emergency Department Provider    ------------------------------------------------------------------------------  Advanced Imaging: CTA Head and Neck Completed.  CTP Completed.  LVO:No  Patient doesn't meet criteria for emergent NIR consideration   Metrics: Last Known Well: 02/05/2022 20:00:00 [facility] Notification Time: 02/05/2022 21:17:45 Arrival Time: 02/05/2022  21:06:00 Stamp Time: 02/05/2022 21:17:45 Initial Response Time: 02/05/2022 21:19:04 Symptoms: aphasia. Initial patient interaction: 02/05/2022 21:21:26 NIHSS Assessment Completed: 02/05/2022 21:28:31 Patient is not a candidate for Thrombolytic. Thrombolytic Medical Decision: 02/05/2022 21:28:32 Patient was not deemed candidate for Thrombolytic because of following reasons: Use of NOAs within 48 hours.  I personally Reviewed the CT Head and it Showed no acute abnormalities  Primary Provider Notified of Diagnostic Impression and Management Plan on: 02/05/2022 21:28:47    ------------------------------------------------------------------------------  History of Present Illness: Patient is a 71 year old Female.  Patient was brought by EMS for symptoms of aphasia. Patient with multiple medical comorbidities including DVT/PE on Eliquis last dose this morning, peripheral neuropathy, GERD, anxiety, hypothyroidism, who presents to the hospital concern for aphasia and right-sided weakness. Last known well at 8 PM this evening. Began having difficulty with her speech. On exam she is able to follow commands without any difficulty but when asked to speak she is unable to speak. She does appear to have right leg weakness as well. Indicates that this is new for her.   Past Medical History: [place] PMH:  See HPI Above  Medications:  Anticoagulant use:  Yes eliquis No Antiplatelet use Reviewed EMR for current medications  Allergies:  Reviewed  Social History: Drug Use: No  Family  History:  There is no family history of premature cerebrovascular disease pertinent to this consultation  ROS : 14 Points Review of Systems was performed and was negative except mentioned in HPI.  Past Surgical History: There Is No Surgical History Contributory To Today's Visit    Examination: BP(148/76), Pulse(92), Blood Glucose(166) 1A: Level of Consciousness - Alert; keenly responsive + 0 1B: Ask  Month and Age - Aphasic + 2 1C: Blink Eyes & Squeeze Hands - Performs Both Tasks + 0 2: Test Horizontal Extraocular Movements - Normal + 0 3: Test Visual Fields - No Visual Loss + 0 4: Test Facial Palsy (Use Grimace if Obtunded) - Normal symmetry + 0 5A: Test Left Arm Motor Drift - No Drift for 10 Seconds + 0 5B: Test Right Arm Motor Drift - Drift, but doesn't hit bed + 1 6A: Test Left Leg Motor Drift - No Drift for 5 Seconds + 0 6B: Test Right Leg Motor Drift - No Drift for 5 Seconds + 0 7: Test Limb Ataxia (FNF/Heel-Shin) - No Ataxia + 0 8: Test Sensation - Normal; No sensory loss + 0 9: Test Language/Aphasia - Mute/Global Aphasia: No Usable Speech/Auditory Comprehension + 3 10: Test Dysarthria - Mute/Anarthric + 2 11: Test Extinction/Inattention - No abnormality + 0  NIHSS Score: 8   Pre-Morbid Modified Rankin Scale: 0 Points = No symptoms at all   Patient/Family was informed the Neurology Consult would occur via TeleHealth consult by way of interactive audio and video telecommunications and consented to receiving care in this manner.   Patient is being evaluated for possible acute neurologic impairment and high probability of imminent or life-threatening deterioration. I spent total of 35 minutes providing care to this patient, including time for face to face visit via telemedicine, review of medical records, imaging studies and discussion of findings with providers, the patient and/or family.   Dr Knox Royalty   [facility] For Inpatient follow-up with [facility] physician please call [facility] [phone]. This is not an outpatient service. Post hospital discharge, please contact hospital directly.

## 2022-02-05 NOTE — ED Notes (Signed)
Pt to CT at this time.

## 2022-02-05 NOTE — ED Triage Notes (Addendum)
Pt arrived via [facility] from home where pt was sitting in chair having argument with husband and then at 2000 began to attempt to say stroke but was slurred. Assessment by EMS right arm drift, aphagia, asymmetrical right sided facial droop, right leg weakness as well as weak right hand grip. Pupils equal and reactive no deviation. Pt on Eliquis. No prior reported CVA hx.

## 2022-02-05 NOTE — ED Provider Notes (Signed)
[facility] Provider Note    Event Date/Time   First MD Initiated Contact with Patient 02/05/22 2108     (approximate)   History   Chief Complaint: Code Stroke   HPI  [name] [name] is a 71 y.o. female with a history of GERD, anxiety, pulmonary embolism on Eliquis who is brought to the ED for right-sided weakness.  EMS reports the patient was in her usual state of health, having an argument with her husband until about 8:00 PM tonight when she started having slurred speech and then became aphasic.  She was noted to have right-sided facial droop and right arm weakness.  No recent trauma, no fever, no seizure.  EMS activated code stroke prior to arrival.     Physical Exam   Triage Vital Signs: ED Triage Vitals  Enc Vitals Group     BP      Pulse      Resp      Temp      Temp src      SpO2      Weight      Height      Head Circumference      Peak Flow      Pain Score      Pain Loc      Pain Edu?      Excl. in [place]?     Most recent vital signs: Vitals:   02/05/22 2126  BP: (!) 166/81  Pulse: 92  Resp: 18  Temp: 97.9 F (36.6 C)  SpO2: 99%    General: Awake, no distress.  CV:  Good peripheral perfusion.  Regular rate and rhythm Resp:  Normal effort.  Abd:  No distention.  Other:  Normal left-sided strength, poor effort on the right side.  When asked to demonstrate cranial nerves including smile, patient does not attempt and instead becomes upset, covering her face with her left hand..   ED Results / Procedures / Treatments   Labs (all labs ordered are listed, but only abnormal results are displayed) Labs Reviewed  COMPREHENSIVE METABOLIC PANEL - Abnormal; Notable for the following components:      Result Value   Chloride 113 (*)    Glucose, Bld 131 (*)    BUN 27 (*)    Creatinine, Ser 1.61 (*)    GFR, Estimated 34 (*)    All other components within normal limits  PROTIME-INR  APTT  CBC  DIFFERENTIAL  ETHANOL  CBG  MONITORING, ED     EKG Interpreted by me Sinus rhythm rate of 86.  Normal axis, first-degree AV block.  Right bundle branch block.  No acute ischemic changes.   RADIOLOGY CT head interpreted by me, negative for mass or intracranial hemorrhage.  No dense infarct.  Radiology report reviewed.  CTA head and neck negative for acute findings.   PROCEDURES:  .Critical Care  Performed by: Carrie Mew, MD Authorized by: Carrie Mew, MD   Critical care provider statement:    Critical care time (minutes):  35   Critical care time was exclusive of:  Separately billable procedures and treating other patients   Critical care was necessary to treat or prevent imminent or life-threatening deterioration of the following conditions:  CNS failure or compromise and circulatory failure   Critical care was time spent personally by me on the following activities:  Development of treatment plan with patient or surrogate, discussions with consultants, evaluation of patient's response to treatment, examination of patient,  obtaining history from patient or surrogate, ordering and performing treatments and interventions, ordering and review of laboratory studies, ordering and review of radiographic studies, pulse oximetry, re-evaluation of patient's condition and review of old charts   Care discussed with: admitting provider   Comments:        .1-3 Lead EKG Interpretation  Performed by: Carrie Mew, MD Authorized by: Carrie Mew, MD     Interpretation: normal     ECG rate:  80   ECG rate assessment: normal     Rhythm: sinus rhythm     Ectopy: none     Conduction: normal      MEDICATIONS ORDERED IN ED: Medications  sodium chloride flush (NS) 0.9 % injection 3 mL (has no administration in time range)  aspirin suppository 300 mg (300 mg Rectal Not Given 02/05/22 2213)  iohexol (OMNIPAQUE) 350 MG/ML injection 100 mL (100 mLs Intravenous Contrast Given 02/05/22 2119)      IMPRESSION / MDM / [place] / ED COURSE  I reviewed the triage vital signs and the nursing notes.                              Differential diagnosis includes, but is not limited to, acute ischemic stroke, intracranial hemorrhage, large vessel occlusion, conversion disorder  Patient's presentation is most consistent with acute presentation with potential threat to life or bodily function.  Patient presents with acute neurodeficits worrisome for left MCA stroke syndrome.  She is maintaining her airway.  She is alert.  Vital signs unremarkable.  Proceed with code stroke protocol.  Not a candidate for TNK due to Eliquis use.   Clinical Course as of 02/05/22 2221  Tue Feb 05, 2022  2119 Imaging discussed with radiology.  Noncontrast CT head negative.  CT angio head and neck preliminary negative for LVO. [PS]    Clinical Course User Index [PS] Carrie Mew, MD    ----------------------------------------- 10:14 PM on 02/05/2022 ----------------------------------------- Evaluation discussed with neurology who notes the patient is not a candidate for intervention but with her symptoms recommends hospitalization for further stroke work-up including MRI.  On reassessment, still is not able to speak verbally.  However, she is mouthing words appropriately.  I provided her pen and paper and she uses both arms fluently to coordinate the objects and write words in a very smooth linear fashion.  With a negative work-up, this is suspicious for left MCA TIA versus a functional disorder.  Since she is on Eliquis and symptoms are dramatically improving, I will defer on getting aspirin for now out of concern for provoking spontaneous bleeding.  Case discussed with the hospitalist for further evaluation and management.   FINAL CLINICAL IMPRESSION(S) / ED DIAGNOSES   Final diagnoses:  TIA (transient ischemic attack)     Rx / DC Orders   ED Discharge Orders     None         Note:  This document was prepared using Dragon voice recognition software and may include unintentional dictation errors.   Carrie Mew, MD 02/05/22 2221

## 2022-02-05 NOTE — ED Notes (Signed)
Writer with pt to room from CT/CVA with perfusion. Tele-neurologist on screen on arrival to room.

## 2022-02-05 NOTE — ED Notes (Signed)
MD Joni Fears contacted while Probation officer in [place] for order CTA with perfusion. Order placed and scan initiated.

## 2022-02-05 NOTE — Progress Notes (Signed)
Code Stroke activated @ 2107.  Pt already in CT.  Returned from CT @ 2119.  Telespecialist on camera @ 2120. LKWT 2000.  mRS 0.  NIHSS 8.  No TNK per [facility] d/t Eliquis use.

## 2022-02-06 ENCOUNTER — Other Ambulatory Visit: Payer: Self-pay

## 2022-02-06 ENCOUNTER — Observation Stay: Payer: Medicare Other

## 2022-02-06 ENCOUNTER — Observation Stay
Admit: 2022-02-06 | Discharge: 2022-02-06 | Disposition: A | Discharge status: Home / Self Care | Payer: Medicare Other | Attending: Internal Medicine | Admitting: Internal Medicine

## 2022-02-06 DIAGNOSIS — I639 Cerebral infarction, unspecified: Secondary | ICD-10-CM

## 2022-02-06 DIAGNOSIS — E782 Mixed hyperlipidemia: Secondary | ICD-10-CM

## 2022-02-06 DIAGNOSIS — K219 Gastro-esophageal reflux disease without esophagitis: Secondary | ICD-10-CM

## 2022-02-06 DIAGNOSIS — F419 Anxiety disorder, unspecified: Secondary | ICD-10-CM

## 2022-02-06 LAB — HEMOGLOBIN A1C
Hgb A1c MFr Bld: 5.3 % (ref 4.8–5.6)
Mean Plasma Glucose: 105.41 mg/dL

## 2022-02-06 LAB — ECHOCARDIOGRAM COMPLETE
AR max vel: 2.49 cm2
AV Area VTI: 2.85 cm2
AV Area mean vel: 2.53 cm2
AV Mean grad: 2 mmHg
AV Peak grad: 3.7 mmHg
Ao pk vel: 0.96 m/s
Area-P 1/2: 4.25 cm2
S' Lateral: 3.93 cm
Single Plane A4C EF: 55.5 %

## 2022-02-06 LAB — CBG MONITORING, ED: Glucose-Capillary: 133 mg/dL — ABNORMAL HIGH (ref 70–99)

## 2022-02-06 LAB — LIPID PANEL
Cholesterol: 206 mg/dL — ABNORMAL HIGH (ref 0–200)
HDL: 58 mg/dL (ref >40)
LDL Cholesterol: 124 mg/dL — ABNORMAL HIGH (ref 0–99)
Total CHOL/HDL Ratio: 3.6 RATIO
Triglycerides: 121 mg/dL (ref <150)
VLDL: 24 mg/dL (ref 0–40)

## 2022-02-06 MED ORDER — TEMAZEPAM 7.5 MG PO CAPS: 15.0000 mg | ORAL_CAPSULE | Freq: Every day | ORAL | Status: DC

## 2022-02-06 MED ORDER — LORAZEPAM 1 MG PO TABS: 1.0000 mg | ORAL_TABLET | Freq: Every day | ORAL | Status: DC

## 2022-02-06 MED ORDER — GABAPENTIN 300 MG PO CAPS
600.0000 mg | ORAL_CAPSULE | Freq: Every day | ORAL | Status: DC
  Administered 2022-02-06: 600 mg via ORAL
  Filled 2022-02-06: qty 2

## 2022-02-06 MED ORDER — APIXABAN 5 MG PO TABS
5.0000 mg | ORAL_TABLET | Freq: Two times a day (BID) | ORAL | Status: DC
  Administered 2022-02-06 – 2022-02-07 (×3): 5 mg via ORAL
  Filled 2022-02-06 (×3): qty 1

## 2022-02-06 MED ORDER — CYANOCOBALAMIN 500 MCG PO TBDP: ORAL_TABLET | Freq: Every morning | ORAL | Status: DC

## 2022-02-06 MED ORDER — STROKE: EARLY STAGES OF RECOVERY BOOK: Freq: Once | Status: AC

## 2022-02-06 MED ORDER — ACETAMINOPHEN 325 MG PO TABS
650.0000 mg | ORAL_TABLET | Freq: Four times a day (QID) | ORAL | Status: DC | PRN
  Administered 2022-02-06 (×2): 650 mg via ORAL
  Filled 2022-02-06 (×2): qty 2

## 2022-02-06 MED ORDER — LEVOTHYROXINE SODIUM 25 MCG PO TABS
125.0000 ug | ORAL_TABLET | Freq: Every day | ORAL | Status: DC
  Administered 2022-02-07: 125 ug via ORAL
  Filled 2022-02-06: qty 1

## 2022-02-06 MED ORDER — LORAZEPAM 1 MG PO TABS
1.0000 mg | ORAL_TABLET | Freq: Every evening | ORAL | Status: DC | PRN
  Administered 2022-02-06: 1 mg via ORAL
  Filled 2022-02-06: qty 1

## 2022-02-06 MED ORDER — TRAZODONE HCL 50 MG PO TABS
50.0000 mg | ORAL_TABLET | Freq: Every day | ORAL | Status: DC
  Administered 2022-02-06: 50 mg via ORAL
  Filled 2022-02-06: qty 1

## 2022-02-06 MED ORDER — PANTOPRAZOLE SODIUM 40 MG PO TBEC
40.0000 mg | DELAYED_RELEASE_TABLET | Freq: Every day | ORAL | Status: DC
  Administered 2022-02-06 – 2022-02-07 (×2): 40 mg via ORAL
  Filled 2022-02-06 (×2): qty 1

## 2022-02-06 NOTE — Evaluation (Signed)
Physical Therapy Evaluation Patient Details Name: [name] [name] MRN: [id number] DOB: 1950-12-12 Today's Date: 02/06/2022  History of Present Illness  Pt is a 71 y.o. 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 MD assessment on admission included possible acute ischemic stroke and AKI. Of note, per MD impression from subsequent MRI includes No acute intracranial abnormality.  Clinical Impression  Pt was pleasant and motivated to participate during the session and put forth good effort throughout. Pt has difficulty formulating sentences and occasionally able to but needing extra time. Able to follow commands and understands all questions being asked of her. SpO2 WNL on room air and HR in mid 40s at rest; MD gave OK for patient to participate with therapy as long as HR remains >/= 40s; HR as high as mid 80s after activity and no other adverse symptoms reported/observed. Pt able to complete supine to sit w/ CGA with extra time and effort to complete and min cuing for sequencing. Upon sitting pt reports increases in dizziness w/ BP at 152/63 but symptoms improved as session progressed. Pt able to complete sit to stand w/ CGA with extra effort. Pt ambulated 26f using RW w/ CGA and able to take steps forwards/backwards with slow cautious gait; able to perform step to pattern when cued to walk as normal. Pt will benefit from OPPT upon discharge to safely address deficits listed in patient problem list for decreased caregiver assistance and eventual return to PLOF.       Recommendations for follow up therapy are one component of a multi-disciplinary discharge planning process, led by the attending physician.  Recommendations may be updated based on patient status, additional functional  criteria and insurance authorization.  Follow Up Recommendations Outpatient PT      Assistance Recommended at Discharge Intermittent Supervision/Assistance  Patient can return home with the following  A little help with walking and/or transfers;A little help with bathing/dressing/bathroom;Assistance with cooking/housework;Assist for transportation;Help with stairs or ramp for entrance    Equipment Recommendations BSC/3in1  Recommendations for Other Services       Functional Status Assessment Patient has had a recent decline in their functional status and demonstrates the ability to make significant improvements in function in a reasonable and predictable amount of time.     Precautions / Restrictions Precautions Precautions: Fall Restrictions Weight Bearing Restrictions: No      Mobility  Bed Mobility Overal bed mobility: Needs Assistance Bed Mobility: Supine to Sit     Supine to sit: Min guard     General bed mobility comments: extra time and effort to complete, min cuing for sequencing    Transfers Overall transfer level: Needs assistance Equipment used: Rolling walker (2 wheels) Transfers: Sit to/from Stand Sit to Stand: Min guard           General transfer comment: extra time and effort to complete; min cuing for hand placement    Ambulation/Gait Ambulation/Gait assistance: Min guard Gait Distance (Feet): 15 Feet Assistive device: Rolling walker (2 wheels) Gait Pattern/deviations: Step-through pattern, Step-to pattern, Decreased step length - right, Decreased step length - left Gait velocity: decreased     General Gait Details: very slow cautious gait and able to take steps forwards/backwards but no LOB; able to walk with step through pattern when cued to walk as normal  Stairs  Wheelchair Mobility    Modified Rankin (Stroke Patients Only)       Balance Overall balance assessment: Needs assistance Sitting-balance support: Feet  supported Sitting balance-Leahy Scale: Good     Standing balance support: Bilateral upper extremity supported, During functional activity Standing balance-Leahy Scale: Fair                               Pertinent Vitals/Pain Pain Assessment Pain Assessment: No/denies pain    Home Living Family/patient expects to be discharged to:: Private residence Living Arrangements: Spouse/significant other Available Help at Discharge: Family;Available 24 hours/day Type of Home: House Home Access: Stairs to enter Entrance Stairs-Rails:  (unclear on rails) Entrance Stairs-Number of Steps: 2   Home Layout: One level Home Equipment: Cane - single point      Prior Function Prior Level of Function : Independent/Modified Independent             Mobility Comments: Ind community ambulator using no AD, no hx of falls ADLs Comments: Ind w/ ADLs and does some IADLs. She goes out to eat often and husband takes her to appointments.     Hand Dominance        Extremity/Trunk Assessment   Upper Extremity Assessment Upper Extremity Assessment: Generalized weakness;Overall WFL for tasks assessed;RUE deficits/detail RUE Deficits / Details: functional during session but showed some mild deficits for strength and coordination    Lower Extremity Assessment Lower Extremity Assessment: Defer to PT evaluation RLE Deficits / Details: RLE MMT 2/5; LLE MMT WFL. Of note, pt steady/stable on RLE during SL stance with functional tasks RLE Sensation: decreased light touch       Communication   Communication: Expressive difficulties  Cognition Arousal/Alertness: Awake/alert Behavior During Therapy: WFL for tasks assessed/performed Overall Cognitive Status: Within Functional Limits for tasks assessed                                 General Comments: diffiulty formulating sentences but occasionally able to. able to follow commands and understands questions and directions being  asked        General Comments      Exercises General Exercises - Lower Extremity Ankle Circles/Pumps: Strengthening, Both, 10 reps Quad Sets: Strengthening, Both, 10 reps Gluteal Sets: Strengthening, Both, 10 reps Long Arc Quad: Strengthening, Both, 10 reps Heel Slides: Strengthening, Both, 10 reps   Assessment/Plan    PT Assessment Patient needs continued PT services  PT Problem List Decreased strength;Decreased mobility;Decreased activity tolerance;Decreased balance;Decreased knowledge of use of DME       PT Treatment Interventions DME instruction;Therapeutic exercise;Gait training;Stair training;Functional mobility training;Therapeutic activities;Patient/family education;Balance training    PT Goals (Current goals can be found in the Care Plan section)  Acute Rehab PT Goals Patient Stated Goal: none stated PT Goal Formulation: With patient Time For Goal Achievement: 02/19/22 Potential to Achieve Goals: Good    Frequency Min 2X/week     Co-evaluation               AM-PAC PT "6 Clicks" Mobility  Outcome Measure Help needed turning from your back to your side while in a flat bed without using bedrails?: A Little Help needed moving from lying on your back to sitting on the side of a flat bed without using bedrails?: A Little Help needed moving to and from a bed to a chair (including a wheelchair)?: A  Little Help needed standing up from a chair using your arms (e.g., wheelchair or bedside chair)?: A Little Help needed to walk in hospital room?: A Little Help needed climbing 3-5 steps with a railing? : A Little 6 Click Score: 18    End of Session Equipment Utilized During Treatment: Gait belt Activity Tolerance: Patient tolerated treatment well Patient left: in chair;with call bell/phone within reach;with chair alarm set;with family/visitor present Nurse Communication: Mobility status PT Visit Diagnosis: Unsteadiness on feet (R26.81);Difficulty in walking, not  elsewhere classified (R26.2);Muscle weakness (generalized) (M62.81)    Time: 0981-1914 PT Time Calculation (min) (ACUTE ONLY): 46 min   Charges:              Turner Daniels, SPT  02/06/2022, 1:57 PM

## 2022-02-06 NOTE — Evaluation (Signed)
Occupational Therapy Evaluation Patient Details Name: [name] [name] MRN: [id number] DOB: [birth year] Today's Date: 02/06/2022   History of Present Illness Pt is a 71 y.o. 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 MD assessment includes possible acute ischemic stroke and AKI   Clinical Impression   Pt seated in recliner chair with son and husband present in room. Pt is agreeable to OT assessment and intervention. Pt reports living at home with husband at baseline. She does not use AD and is able to perform self care tasks without assistance PTA. Pt endorses going out to eat often and have supporting family if she needs assistance. Pt displays decreased coordination and strength in R UE but these findings are not consistent with functional tasks seen later in session where pt performs toileting, LB clothing management, and grooming tasks in standing with supervision and no coordination difficulty. Pt ambulates with RW for 32' and then progresses to ambulation of another 150' without use of AD and supervision before returning to room. Pt seated in recliner chair and given 2 moderate difficulty mazes and paper tasks with pen to work on R hand coordination in room. All needs within reach and family remains present.      Recommendations for follow up therapy are one component of a multi-disciplinary discharge planning process, led by the attending physician.  Recommendations may be updated based on patient status, additional functional criteria and insurance authorization.   Follow Up Recommendations  Outpatient OT    Assistance Recommended at Discharge Intermittent Supervision/Assistance  Patient can return home with the following A little help with walking and/or transfers;A  little help with bathing/dressing/bathroom;Assist for transportation;Assistance with cooking/housework    Functional Status Assessment  Patient has had a recent decline in their functional status and demonstrates the ability to make significant improvements in function in a reasonable and predictable amount of time.  Equipment Recommendations  None recommended by OT       Precautions / Restrictions Precautions Precautions: Fall Restrictions Weight Bearing Restrictions: No      Mobility Bed Mobility               General bed mobility comments: seated in recliner chair    Transfers Overall transfer level: Needs assistance Equipment used: Rolling walker (2 wheels), None Transfers: Sit to/from Stand Sit to Stand: Min guard                  Balance Overall balance assessment: Needs assistance Sitting-balance support: Feet supported Sitting balance-Leahy Scale: Good     Standing balance support: Bilateral upper extremity supported, During functional activity Standing balance-Leahy Scale: Fair                             ADL either performed or assessed with clinical judgement   ADL                                         General ADL Comments: min A progressing to supervision during session for functional mobility, toileting, functional transfers, and grooming tasks in standing.     Vision Patient Visual Report: No change from baseline  Pertinent Vitals/Pain Pain Assessment Pain Assessment: No/denies pain        Extremity/Trunk Assessment Upper Extremity Assessment Upper Extremity Assessment: Generalized weakness;Overall WFL for tasks assessed;RUE deficits/detail RUE Deficits / Details: functional during session but showed some mild deficits for strength and coordination   Lower Extremity Assessment Lower Extremity Assessment: Defer to PT evaluation RLE Deficits / Details: RLE MMT 2/5; LLE MMT WFL RLE  Sensation: decreased light touch       Communication Communication Communication: Expressive difficulties   Cognition Arousal/Alertness: Awake/alert Behavior During Therapy: WFL for tasks assessed/performed Overall Cognitive Status: Within Functional Limits for tasks assessed                                                  Home Living Family/patient expects to be discharged to:: Private residence Living Arrangements: Spouse/significant other Available Help at Discharge: Family;Available 24 hours/day Type of Home: House Home Access: Stairs to enter [employer] of Steps: 2 Entrance Stairs-Rails:  (unclear on rails) Home Layout: One level     Bathroom Shower/Tub: Teacher, early years/pre: Standard Bathroom Accessibility: Yes   Home Equipment: Cane - single point          Prior Functioning/Environment Prior Level of Function : Independent/Modified Independent             Mobility Comments: Ind community ambulator using no AD, no hx of falls ADLs Comments: Ind w/ ADLs and does some IADLs. She goes out to eat often and husband takes her to appointments.        OT Problem List: Decreased strength;Decreased coordination;Decreased activity tolerance;Decreased safety awareness;Impaired balance (sitting and/or standing)      OT Treatment/Interventions: Self-care/ADL training;Therapeutic exercise;Therapeutic activities;DME and/or AE instruction;Manual therapy;Balance training;Patient/family education;Energy conservation    OT Goals(Current goals can be found in the care plan section) Acute Rehab OT Goals Patient Stated Goal: to go home OT Goal Formulation: With patient/family Time For Goal Achievement: 02/20/22 Potential to Achieve Goals: Good ADL Goals Pt Will Perform Eating: Independently;sitting Pt Will Perform Lower Body Dressing: with modified independence;sit to/from stand Pt Will Transfer to Toilet: with modified  independence;ambulating Pt Will Perform Toileting - Clothing Manipulation and hygiene: with modified independence;sit to/from stand  OT Frequency: Min 2X/week       AM-PAC OT "6 Clicks" Daily Activity     Outcome Measure Help from another person eating meals?: None Help from another person taking care of personal grooming?: None Help from another person toileting, which includes using toliet, bedpan, or urinal?: A Little Help from another person bathing (including washing, rinsing, drying)?: A Little Help from another person to put on and taking off regular upper body clothing?: None Help from another person to put on and taking off regular lower body clothing?: A Little 6 Click Score: 21   End of Session Equipment Utilized During Treatment: Gait belt;Rolling walker (2 wheels) Nurse Communication: Mobility status  Activity Tolerance: Patient tolerated treatment well Patient left: in chair;with call bell/phone within reach;with chair alarm set;with family/visitor present  OT Visit Diagnosis: Muscle weakness (generalized) (M62.81);Unsteadiness on feet (R26.81)                Time: 2505-3976 OT Time Calculation (min): 46 min Charges:  OT General Charges $OT Visit: 1 Visit OT Evaluation $OT Eval Moderate Complexity: 1 Mod OT Treatments $Self Care/Home Management :  8-22 mins $Therapeutic Activity: 8-22 mins  Darleen Crocker, MS, OTR/L , CBIS ascom [phone]  02/06/22, 1:12 PM

## 2022-02-06 NOTE — Progress Notes (Signed)
*  PRELIMINARY RESULTS* Echocardiogram 2D Echocardiogram has been performed.  [name] [name] [name] [name] 02/06/2022, 11:28 AM

## 2022-02-06 NOTE — Evaluation (Addendum)
Speech Language Pathology Evaluation Patient Details Name: [name] [name] MRN: [id number] DOB: Dec 11, 1950 Today's Date: 02/06/2022 Time: 1215-1300 SLP Time Calculation (min) (ACUTE ONLY): 45 min  Problem List:  Patient Active Problem List   Diagnosis Date Noted   Mixed hyperlipidemia 02/06/2022   GERD (gastroesophageal reflux disease) 02/06/2022   Anxiety 02/06/2022   Acute ischemic stroke ([place]) 02/05/2022   Left leg pain 11/21/2021   Hypocalcemia 08/65/7846   Acute metabolic encephalopathy 96/29/5284   AKI (acute kidney injury) ([place]) 11/20/2021   Acquired hypothyroidism 11/20/2021   Neuropathy 11/20/2021   DVT (deep venous thrombosis) ([place]) 09/20/2018   Pulmonary emboli ([place]) 08/18/2018   Pulmonary embolism ([place]) 08/18/2018   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 lumpectomy, skin ca on same breast    GERD (gastroesophageal reflux disease)    Hypercholesteremia    Peripheral neuropathic pain    Personal history of chemotherapy    2006/2014   Personal history of radiation therapy    2006/2014   Pulmonary emboli [facility])    Urinary incontinence    Past Surgical History:  Past Surgical History:  Procedure Laterality Date   APPENDECTOMY     CARDIAC CATHETERIZATION Left 05/23/2016   Procedure: Left Heart Cath and Coronary Angiography;  Surgeon: Isaias Cowman, MD;  Location: [facility];  Service: Cardiovascular;  Laterality: Left;   IVC FILTER INSERTION N/A 08/19/2018   Procedure: IVC FILTER INSERTION;  Surgeon: Algernon Huxley, MD;  Location: [place] [facility];  Service: Cardiovascular;  Laterality: N/A;   MASTECTOMY Right 2006/2014   rt breast removal     cancer   HPI:  Pt is a 71 y.o. 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  CT angiogram of the head and neck was negative for large vessel occlusion, CT head no acute abnormalities.  Admitted with neurology consultation.  Apparently symptoms started when she was arguing with her husband and suddenly started slurring.   MRI of the brain, no acute intracranial abnormality; 2. Moderate chronic microvascular ischemic disease;  3. Pituitary 4 mm nodular lesion.    Per Neurology note today, Patient had variable exam findings and a very inconsistent neurological exams.  Later in the day, she is taking laps around the unit, her voice is clear.  Neurological deficit on presentation do not match with the relatively normal findings on her MRI.  May have underlying anxiety or panic attack issues.   Assessment / Plan / Recommendation Clinical Impression   Patient seen for general, informal screening of cognitive-communications abilities post admit yesterday w/ concern for stroke, difficulty w/ speech-communication. MRI revealed "No acute intracranial abnormality.  2. Moderate chronic microvascular ischemic disease; 3. Pituitary 4 mm nodular lesion.". Pt presents with functional cognitive-communication abilities characterized by adequate abilities in attention, awareness, problem-solving, and memory tasks -- any mild deficits in these areas can contribute to decreased function in higher level skills, executive function tasks. Patient with was cooperative and responded easily to tasks w/ intermittent, general verbal cues. She demonstrated relative strength in verbal fluency/communication in general social conversation and tasks; mildly distracted w/ the many  acute events and hospitalization -- including sadness re: her Husband's lability when having to leave her. Pt stated Husband has Dementia.  Pt was  able to read and order food items from the menu for her Lunch meal. She communicated appropriately in conversation w/ Son and Husband while present. Pragmatics appropriate. No overt receptive/expressive language deficits noted during this session. OM function/strength and Motor Planning appeared [facility].    No immediate skilled ST services indicated at this time as pt appears to be returning to her functional baseline. Per results of the MRI noting "Moderate chronic microvascular ischemic disease", pt would benefit from f/u w/ Neurology for a Formal Cognitive evaluation post discharge and acuity of illness. F/u w/ above was had w/ both pt and Family in room. Education given on monitoring environment and giving verbal cues during cognitive-communication engagement if needed. NSG and MD updated.    SLP Assessment  SLP Recommendation/Assessment: Patient does not need any further Speech [place] [facility] SLP Visit Diagnosis: Cognitive communication deficit (R41.841)    Recommendations for follow up therapy are one component of a multi-disciplinary discharge planning process, led by the attending physician.  Recommendations may be updated based on patient status, additional functional criteria and insurance authorization.    Follow Up Recommendations  No SLP follow up    Assistance Recommended at Discharge  None  Functional Status Assessment Patient has had a recent decline in their functional status and demonstrates the ability to make significant improvements in function in a reasonable and predictable amount of time.  Frequency and Duration  (n/a)   (n/a)      SLP Evaluation Cognition  Overall Cognitive Status: Within Functional Limits for tasks assessed Arousal/Alertness: Awake/alert Orientation Level: Oriented X4 Attention: Focused;Sustained Focused Attention: Appears intact Sustained Attention: Appears intact Memory: Appears intact Awareness: Appears intact Problem Solving:  Appears intact Executive Function: Reasoning;Sequencing Reasoning: Appears intact Sequencing: Appears intact Safety/Judgment: Appears intact Comments: adequate for verbal tasks       Comprehension  Auditory Comprehension Overall Auditory Comprehension: Appears within functional limits for tasks assessed Yes/No Questions: Within Functional Limits Commands: Within Functional Limits Conversation: Simple Visual Recognition/Discrimination Discrimination: Not tested Reading Comprehension Reading Status: Within funtional limits (readers)    Expression Expression Primary Mode of Expression: Verbal Verbal Expression Overall Verbal Expression: Appears within functional limits for tasks assessed Initiation: No impairment Automatic Speech: Name;Social Response Level of Generative/Spontaneous Verbalization: Sentence Repetition: No impairment Naming: No impairment Pragmatics: No impairment Non-Verbal Means of Communication: Not applicable Written Expression Dominant Hand: Right Written Expression: Not tested   Oral / Motor  Oral Motor/Sensory Function Overall Oral Motor/Sensory Function: Within functional limits Motor Speech Overall Motor Speech: Appears within functional limits for tasks assessed Respiration: Within functional limits Phonation: Normal Resonance: Within functional limits Articulation: Within functional limitis Intelligibility: Intelligible Motor Planning: Witnin functional limits Motor Speech Errors: Not applicable               Orinda Kenner, MS, CCC-SLP Speech Language Pathologist Rehab Services; [place] [phone] (ascom) Awanda Wilcock 02/06/2022, 3:32 PM

## 2022-02-06 NOTE — Therapy (Signed)
Occupational Therapy * Physical Therapy * Speech Therapy          DATE ______12 Jul 2023_____________ PATIENT NAME____[name]_________________ PATIENT MRN________[id number]____________  DIAGNOSIS/DIAGNOSIS CODE _______ Acute ischemic stroke ([place]) I63.9  _______________ DATE OF DISCHARGE: ____12 Jul 2023__________  PRIMARY CARE PHYSICIAN _______ Rusty Aus, MD       General - Internal Medicine    [phone]    [phone]  ___________________ PCP PHONE/FAX___________________________     Dear Provider (Name: __________________   Fax: ___________________________):   I certify that I have examined this patient and that occupational/physical/speech therapy is necessary on an outpatient basis.    The patient has expressed interest in completing their recommended course of therapy at your location.  Once a formal order from the patient's primary care physician has been obtained, please contact him/her to schedule an appointment for evaluation at your earliest convenience.   [ x ]  Physical Therapy Evaluate and Treat          [  x]  Occupational Therapy Evaluate and Treat                                    [  ]  Speech Therapy Evaluate and Treat       The patient's primary care physician (listed above) must furnish and be responsible for a formal order such that the recommended services may be furnished while under the primary physician's care, and that the plan of care will be established and reviewed every 30 days (or more often if condition necessitates).

## 2022-02-06 NOTE — ED Notes (Signed)
Pt sent to MRI 

## 2022-02-06 NOTE — Consult Note (Signed)
NEURO HOSPITALIST CONSULT NOTE   Requesting physician: Dr. Sloan Leiter  Reason for Consult:Transient speech deficit  History obtained from:  Patient and Chart     HPI:                                                                                                                                          [name] [name] is an 71 y.o. 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   Per EDP Triage note: "Pt arrived via [facility] from home where pt was sitting in chair having argument with husband and then at 2000 began to attempt to say stroke but was slurred. Assessment by EMS right arm drift, aphagia, asymmetrical right sided facial droop, right leg weakness as well as weak right hand grip. Pupils equal and reactive no deviation. Pt on Eliquis. No prior reported CVA hx."  Teleneurology consult was obtained and on their exam the patient was aphasic in the context of mutism as well as with right arm drift. NIHSS was 8. She was, however, able to communicate with writing and indicated that she had no migrainous symptoms. She was able to comprehend all commands at that time.   CT head, CTA and CTP showed no acute abnormalities. She was admitted for stroke/TIA work up.   Past Medical History:  Diagnosis Date   Anxiety    Cancer ([place])    2006 right side breast ca. lumpectomy, skin ca on same breast    GERD (gastroesophageal reflux disease)    Hypercholesteremia    Peripheral neuropathic pain    Personal history of chemotherapy    2006/2014   Personal history of radiation therapy    2006/2014   Pulmonary emboli  [facility])    Urinary incontinence     Past Surgical History:  Procedure Laterality Date   APPENDECTOMY     CARDIAC CATHETERIZATION Left 05/23/2016   Procedure: Left Heart Cath and Coronary Angiography;  Surgeon: Isaias Cowman, MD;  Location: [place] [facility];  Service: Cardiovascular;  Laterality: Left;   IVC FILTER INSERTION N/A 08/19/2018   Procedure: IVC FILTER INSERTION;  Surgeon: Algernon Huxley, MD;  Location: [place] [facility];  Service: Cardiovascular;  Laterality: N/A;   MASTECTOMY Right 2006/2014   rt breast removal     cancer    Family History  Problem Relation Age of Onset   Asthma Mother    COPD Mother    CVA Father    Heart  attack Father    Coronary artery disease Father    Schizophrenia Brother    Peripheral vascular disease Brother    Breast cancer Neg Hx              Social History:  reports that she has never smoked. She has never used smokeless tobacco. She reports that she does not drink alcohol and does not use drugs.  No Known Allergies  MEDICATIONS:                                                                                                                     Prior to Admission:  Medications Prior to Admission  Medication Sig Dispense Refill Last Dose   apixaban (ELIQUIS) 5 MG TABS tablet Take 1 tablet (5 mg total) by mouth 2 (two) times daily.   02/05/2022 at 1030   Cholecalciferol 125 MCG (5000 UT) TABS Take by mouth every morning.    Past Month   Cyanocobalamin 500 MCG TBDP Take by mouth every morning.    Past Month   gabapentin (NEURONTIN) 300 MG capsule Take 600 mg by mouth at bedtime.   02/04/2022 at 2200   levothyroxine (SYNTHROID, LEVOTHROID) 125 MCG tablet Take 125 mcg by mouth.    02/05/2022 at 1030   LORazepam (ATIVAN) 1 MG tablet Take 1 mg by mouth at bedtime.   02/04/2022 at 2200   omeprazole (PRILOSEC) 20 MG capsule Take 20 mg by mouth daily.   02/05/2022 at 1030   simvastatin (ZOCOR) 20 MG tablet Take 20 mg by mouth daily at 6 PM.    02/04/2022 at 2200   traZODone (DESYREL) 50 MG tablet Take 50 mg by mouth at bedtime.   02/04/2022 at 2200   acetaminophen (TYLENOL) 325 MG tablet Take 325 mg by mouth every 4 (four) hours as needed.   prn at prn   temazepam (RESTORIL) 15 MG capsule Take 15 mg by mouth at bedtime.   02/04/2022 at 2200   Scheduled:  [START ON 02/07/2022]  stroke: early stages of recovery book   Does not apply Once   acetaminophen  650 mg Rectal STAT   apixaban  5 mg Oral BID   sodium chloride flush  3 mL Intravenous Once   Continuous:   ROS:  She states that she is currently asymptomatic with her speech back to baseline. She states that her symptoms had resolved by the time she was transferred from the ED to the floor. Denies having had any vision changes, sensory numbness, ataxia, trouble walking or lower extremity weakness at the time of her presentation.    Blood pressure 126/73, pulse 84, temperature 97.9 F (36.6 C), resp. rate 16, height '5\' 7"'$  (1.702 m), weight 80.2 kg, SpO2 99 %.   General Examination:                                                                                                       Physical Exam  HEENT-  [place]/AT   Lungs- Respirations unlabored   Extremities- No edema   Neurological Examination Mental Status: Alert, oriented, thought content appropriate. Pleasant and cooperative with good eye contact. Affect is euthymic. Speech fluent with intact naming of all fingers except for "ring finger". Repetition intact. Comprehension for a 3-step directional command is intact. Normal speech cadence and intonation. Concentration intact when testing ability to recite the months of the year backwards. Able to perform simple math quickly and without error. No right-left confusion. No dysarthria. No neglect.  Cranial Nerves: II: PERRL. Fixates and tracks  normally.  III,IV, VI: EOMI. No nystagmus. No ptosis V: Temp sensation equal bilaterally VII: Smile symmetric VIII: Hearing intact to voice IX,X: No hypophonia or hoarseness XI: Symmetric XII: Midline tongue extension Motor: RUE: 5/5 RLE: 5/5 LUE: 5/5 LLE: 5/5 No pronator drift.  Motor response latencies to commands is normal without asymmetry.  Sensory: Temp and FT intact x 4. There is consistent extinction to LUE and LLE with testing of DSS. Deep Tendon Reflexes: 2+ and symmetric bilateral biceps, brachioradialis and patellae Plantars: Right: downgoingLeft: downgoing Cerebellar: No ataxia with FNF bilaterally Gait: Deferred   Lab Results: Basic Metabolic Panel: Recent Labs  Lab 02/05/22 2120  NA 144  K 3.6  CL 113*  CO2 22  GLUCOSE 131*  BUN 27*  CREATININE 1.61*  CALCIUM 9.1    CBC: Recent Labs  Lab 02/05/22 2120  WBC 5.8  NEUTROABS 3.9  HGB 12.2  HCT 36.9  MCV 87.2  PLT 187    Cardiac Enzymes: No results for input(s): "CKTOTAL", "CKMB", "CKMBINDEX", "TROPONINI" in the last 168 hours.  Lipid Panel: Recent Labs  Lab 02/06/22 0506  CHOL 206*  TRIG 121  HDL 58  CHOLHDL 3.6  VLDL 24  LDLCALC 124*    Imaging: ECHOCARDIOGRAM COMPLETE  Result Date: 02/06/2022    ECHOCARDIOGRAM REPORT   Patient Name:   [name] [name] Date of Exam: 02/06/2022 Medical Rec #:  [id number]      Height:       66.0 in Accession #:    [id number]     Weight:       185.0 lb Date of Birth:  Jan 26, 1951     BSA:          1.935 m Patient Age:    23 years       BP:  148/72 mmHg Patient Gender: F              HR:           57 bpm. Exam Location:  [facility] Procedure: 2D Echo, Color Doppler and Cardiac Doppler Indications:     I63.9 Stroke  History:         Patient has prior history of Echocardiogram examinations. Risk                  Factors:HCL.  Sonographer:     Charmayne Sheer Referring Phys:  [id number] OLADAPO ADEFESO Diagnosing Phys: Serafina Royals MD  Sonographer Comments: Suboptimal  apical window and suboptimal subcostal window. Image acquisition challenging due to patient body habitus. IMPRESSIONS  1. Left ventricular ejection fraction, by estimation, is 60 to 65%. The left ventricle has normal function. The left ventricle has no regional wall motion abnormalities. Left ventricular diastolic parameters were normal.  2. Right ventricular systolic function is normal. The right ventricular size is normal.  3. The mitral valve is normal in structure. Trivial mitral valve regurgitation.  4. The aortic valve is normal in structure. Aortic valve regurgitation is not visualized. FINDINGS  Left Ventricle: Left ventricular ejection fraction, by estimation, is 60 to 65%. The left ventricle has normal function. The left ventricle has no regional wall motion abnormalities. The left ventricular internal cavity size was normal in size. There is  no left ventricular hypertrophy. Left ventricular diastolic parameters were normal. Right Ventricle: The right ventricular size is normal. No increase in right ventricular wall thickness. Right ventricular systolic function is normal. Left Atrium: Left atrial size was normal in size. Right Atrium: Right atrial size was normal in size. Pericardium: There is no evidence of pericardial effusion. Mitral Valve: The mitral valve is normal in structure. Trivial mitral valve regurgitation. Tricuspid Valve: The tricuspid valve is normal in structure. Tricuspid valve regurgitation is trivial. Aortic Valve: The aortic valve is normal in structure. Aortic valve regurgitation is not visualized. Aortic valve mean gradient measures 2.0 mmHg. Aortic valve peak gradient measures 3.7 mmHg. Aortic valve area, by VTI measures 2.85 cm. Pulmonic Valve: The pulmonic valve was normal in structure. Pulmonic valve regurgitation is not visualized. Aorta: The aortic root and ascending aorta are structurally normal, with no evidence of dilitation. IAS/Shunts: No atrial level shunt detected by  color flow Doppler.  LEFT VENTRICLE PLAX 2D LVIDd:         5.21 cm      Diastology LVIDs:         3.93 cm      LV e' medial:    4.90 cm/s LV PW:         1.29 cm      LV E/e' medial:  13.9 LV IVS:        0.78 cm      LV e' lateral:   7.07 cm/s LVOT diam:     2.10 cm      LV E/e' lateral: 9.6 LV SV:         48 LV SV Index:   25 LVOT Area:     3.46 cm  LV Volumes (MOD) LV vol d, MOD A2C: 60.5 ml LV vol d, MOD A4C: 127.0 ml LV vol s, MOD A4C: 56.5 ml LV SV MOD A4C:     127.0 ml RIGHT VENTRICLE RV Basal diam:  3.35 cm RV S prime:     14.10 cm/s LEFT ATRIUM  Index        RIGHT ATRIUM           Index LA diam:      3.60 cm 1.86 cm/m   RA Area:     15.50 cm LA Vol (A2C): 20.0 ml 10.34 ml/m  RA Volume:   41.00 ml  21.19 ml/m LA Vol (A4C): 46.1 ml 23.83 ml/m  AORTIC VALVE                    PULMONIC VALVE AV Area (Vmax):    2.49 cm     PV Vmax:       0.80 m/s AV Area (Vmean):   2.53 cm     PV Peak grad:  2.6 mmHg AV Area (VTI):     2.85 cm AV Vmax:           96.40 cm/s AV Vmean:          63.000 cm/s AV VTI:            0.169 m AV Peak Grad:      3.7 mmHg AV Mean Grad:      2.0 mmHg LVOT Vmax:         69.20 cm/s LVOT Vmean:        46.000 cm/s LVOT VTI:          0.139 m LVOT/AV VTI ratio: 0.82  AORTA Ao Root diam: 3.10 cm MITRAL VALVE MV Area (PHT): 4.25 cm     SHUNTS MV Decel Time: 179 msec     Systemic VTI:  0.14 m MV E velocity: 67.95 cm/s   Systemic Diam: 2.10 cm MV A velocity: 102.85 cm/s MV E/A ratio:  0.66 Serafina Royals MD Electronically signed by Serafina Royals MD Signature Date/Time: 02/06/2022/12:20:51 PM    Final    MR BRAIN WO CONTRAST  Result Date: 02/06/2022 CLINICAL DATA:  Initial evaluation for neuro deficit, stroke suspected. EXAM: MRI HEAD WITHOUT CONTRAST TECHNIQUE: Multiplanar, multiecho pulse sequences of the brain and surrounding structures were obtained without intravenous contrast. COMPARISON:  CTs from 02/05/2022. FINDINGS: Brain: Cerebral volume within normal limits. Moderate  chronic microvascular ischemic disease noted involving the supratentorial cerebral white matter and pons. No evidence for acute or subacute infarct. Gray-white matter differentiation maintained. No areas of chronic cortical infarction. No acute or chronic intracranial blood products. No mass lesion, midline shift or mass effect. No hydrocephalus or extra-axial fluid collection. 4 mm nodular lesion present along the superior aspect of the pituitary gland (series 9, image 12), indeterminate. No significant surrounding regional mass effect. Vascular: Major intracranial vascular flow voids are maintained. Skull and upper cervical spine: Craniocervical junction normal. Bone marrow signal intensity within normal limits. No scalp soft tissue abnormality. Sinuses/Orbits: Globes and orbital soft tissues within normal limits. Mild scattered mucosal thickening noted about the ethmoidal air cells and maxillary sinuses. Trace bilateral mastoid effusions, of doubtful significance. Other: None. IMPRESSION: 1. No acute intracranial abnormality. 2. Moderate chronic microvascular ischemic disease. 3. 4 mm nodular lesion along the superior aspect of the pituitary gland, indeterminate, and of uncertain clinical significance. Correlation with pituitary function tests suggested. Additionally, further evaluation with dedicated pituitary protocol MRI, with and without contrast, could be performed for further evaluation as warranted. Electronically Signed   By: Jeannine Boga M.D.   On: 02/06/2022 02:00   CT ANGIO HEAD NECK W WO CM W PERF (CODE STROKE)  Result Date: 02/05/2022 CLINICAL DATA:  Initial evaluation for neuro deficit, stroke. EXAM: CT  ANGIOGRAPHY HEAD AND NECK CT PERFUSION BRAIN TECHNIQUE: Multidetector CT imaging of the head and neck was performed using the standard protocol during bolus administration of intravenous contrast. Multiplanar CT image reconstructions and MIPs were obtained to evaluate the vascular  anatomy. Carotid stenosis measurements (when applicable) are obtained utilizing NASCET criteria, using the distal internal carotid diameter as the denominator. Multiphase CT imaging of the brain was performed following IV bolus contrast injection. Subsequent parametric perfusion maps were calculated using RAPID software. RADIATION DOSE REDUCTION: This exam was performed according to the departmental dose-optimization program which includes automated exposure control, adjustment of the mA and/or kV according to patient size and/or use of iterative reconstruction technique. CONTRAST:  179m OMNIPAQUE IOHEXOL 350 MG/ML SOLN COMPARISON:  Prior head CT from earlier the same day. FINDINGS: CTA NECK FINDINGS Aortic arch: Visualized aortic arch normal caliber with standard 3 vessel morphology. No stenosis about the origin of the great vessels. Extensive atheromatous irregularity with moderate to severe multifocal stenoses noted involving the right subclavian artery beyond the takeoff of the right vertebral artery (series 6, image 105). Superimposed 4 mm outpouching could reflect a penetrating plaque versus small pseudoaneurysm (series 7, image 135). Right carotid system: Right CCA patent without stenosis. Mild calcified plaque about the right carotid bulb without hemodynamically significant greater than 50% stenosis. Right ICA patent distally without stenosis or dissection. Left carotid system: Left CCA patent without stenosis. Mild atheromatous change about the left carotid bulb without hemodynamically significant greater than 50% stenosis. Left ICA patent distally without stenosis or dissection. Vertebral arteries: Both vertebral arteries arise from the subclavian arteries. No proximal subclavian artery stenosis. Left vertebral artery dominant. Vertebral arteries patent without stenosis or dissection. Skeleton: No discrete or worrisome osseous lesions. Other neck: No other acute soft tissue abnormality within the neck.  Right thyroid lobe hypoplastic. Upper chest: Irregular pleuroparenchymal thickening with scarring present at the anterolateral right lung. Persistent irregular loculated collection within this area measuring up to approximate 4 cm, similar to prior. Prior right mastectomy.//Cardiomegaly partially visualized. Few small right pulmonary nodules measuring up to 5 mm partially visualized, grossly similar to previous, indeterminate. Review of the MIP images confirms the above findings CTA HEAD FINDINGS Anterior circulation: Both internal carotid arteries patent to the termini without stenosis or other abnormality. A1 segments, anterior communicating artery complex, and anterior cerebral arteries widely patent. No M1 stenosis or occlusion. No proximal MCA branch occlusion. Distal MCA branches perfused and symmetric. Posterior circulation: Both vertebral arteries patent without stenosis. Left vertebral artery dominant. Both PICA patent. Basilar patent to its distal aspect without stenosis. Superior cerebellar and posterior cerebral arteries patent bilaterally. Venous sinuses: Patent allowing for timing the contrast bolus. Anatomic variants: As above.  No aneurysm. Review of the MIP images confirms the above findings CT Brain Perfusion Findings: ASPECTS: 10 CBF (<30%) Volume: 058mPerfusion (Tmax>6.0s) volume: 16m39mismatch Volume: 16mL2mfarction Location:Negative CT perfusion with no evidence for acute ischemia or other perfusion deficit. IMPRESSION: 1. Negative CTA for large vessel occlusion or other emergent finding. 2. Negative CT perfusion with no evidence for acute ischemia or other perfusion deficit. 3. 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 Superimposed 4 mm outpouching could reflect a penetrating plaque versus small pseudoaneurysm. 4. Additional mild atherosclerotic change elsewhere within the major arterial vasculature of the head  and neck. No other hemodynamically significant or correctable stenosis. 5. Chronic pulmonary findings as detailed above, similar as compared to prior  CT from 08/22/2021. Results communicated by telephone at the time of interpretation on 02/05/2022 at approximately 9:15 p.m. to provider PHILLIP STAFFORD , who verbally acknowledged these results. Electronically Signed   By: Jeannine Boga M.D.   On: 02/05/2022 21:41   CT HEAD CODE STROKE WO CONTRAST  Result Date: 02/05/2022 CLINICAL DATA:  Code stroke. Initial evaluation for neuro deficit, stroke suspected. EXAM: CT HEAD WITHOUT CONTRAST TECHNIQUE: Contiguous axial images were obtained from the base of the skull through the vertex without intravenous contrast. RADIATION DOSE REDUCTION: This exam was performed according to the departmental dose-optimization program which includes automated exposure control, adjustment of the mA and/or kV according to patient size and/or use of iterative reconstruction technique. COMPARISON:  None Available. FINDINGS: Brain: Age-related cerebral atrophy with chronic small vessel ischemic disease. No acute intracranial hemorrhage. No visible large vessel territory infarct. No mass lesion, mass effect or midline shift. No hydrocephalus or extra-axial fluid collection. Vascular: No hyperdense vessel. Skull: Scalp soft tissues and calvarium within normal limits. Sinuses/Orbits: Globes and orbital soft tissues within normal limits. Paranasal sinuses are largely clear. No mastoid effusion. Other: None. ASPECTS [facility] Stroke Program Early CT Score) - Ganglionic level infarction (caudate, lentiform nuclei, internal capsule, insula, M1-M3 cortex): 7 - Supraganglionic infarction (M4-M6 cortex): 3 Total score (0-10 with 10 being normal): 10 IMPRESSION: 1. No acute intracranial abnormality. 2. ASPECTS is 10. 3. Underlying atrophy with chronic small vessel ischemic disease. Critical Value/emergent results were called by telephone at the  time of interpretation on 02/05/2022 at 9:20 pm to provider PHILLIP STAFFORD , who verbally acknowledged these results. Electronically Signed   By: Jeannine Boga M.D.   On: 02/05/2022 21:23     Assessment: 71 year old female presenting with fluctuating speech deficit in conjunction with right facial droop and right hand/arm incoordination. Symptoms are all now resolved.  1. Only abnormality on exam is consistent extinction to LUE and LLE with testing of DSS on multiple trials. Speech output, speech comprehension, expression of ideas and affect are normal.  2. CT head: No acute intracranial abnormality. Underlying atrophy with chronic small vessel ischemic disease. 3. MRI brain: No acute intracranial abnormality. Moderate chronic microvascular ischemic disease. 4 mm nodular lesion along the superior aspect of the pituitary gland, indeterminate, and of uncertain clinical significance.  4. TTE: LVEF 60 to 65% with no regional LV wall motion abnormalities. No mural thrombus or valvular vegetation mentioned in the report.  5. CTA of head and neck: Negative CTA for large vessel occlusion or other emergent finding. 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 Superimposed 4 mm outpouching could reflect a penetrating plaque versus small pseudoaneurysm. Additional mild atherosclerotic change elsewhere within the major arterial vasculature of the head and neck. No other hemodynamically significant or correctable stenosis. 6. CTP: Negative CT perfusion with no evidence for acute ischemia or other perfusion deficit.  7. EKG: Sinus rhythm with occasional Premature ventricular complexes; Right bundle branch block 8. Overall presentation is most consistent with stuttering TIA involving the left MCA territory.  9. Stroke risk factors: History of cancer and PE   Recommendations: 1. Continue apixaban.  2. High-dose  atorvastatin 3. BP management 4. Will need outpatient Neurology follow up.    Electronically signed: Dr. Kerney Elbe 02/06/2022, 4:33 PM

## 2022-02-06 NOTE — Progress Notes (Signed)
Chaplain offered prayer and ministry of presence as staff worked with patient.

## 2022-02-06 NOTE — Therapy (Signed)
  Transition of Care [facility]) Screening Note   Patient Details  Name: [name] [name] Date of Birth: Jun 24, 1951   Transition of Care [facility]) CM/SW Contact:    Pete Pelt, RN Phone Number: 02/06/2022, 4:27 PM    Transition of Care Department [facility]) has reviewed patient and no TOC needs have been identified at this time. We will continue to monitor patient advancement through interdisciplinary progression rounds. If new patient transition needs arise, please place a TOC consult.

## 2022-02-06 NOTE — ED Notes (Signed)
Informed RN bed assigned 

## 2022-02-06 NOTE — ED Notes (Signed)
Pt in bed, resting comfortably, breathing easy and unlabored, call bell at the bedside, will continue to monitor.

## 2022-02-06 NOTE — ED Notes (Signed)
Secure chat sent to doctor about patients daughter Hermine Messick concerns

## 2022-02-06 NOTE — Progress Notes (Signed)
PROGRESS NOTE    [name] [name]  TXM:[id number] DOB: 1951/02/07 DOA: 02/05/2022 PCP: Rusty Aus, MD    Brief Narrative:  71 year old female with history of hyperlipidemia, GERD, neuropathy, restless leg syndrome, history of PE and DVT on Eliquis and insomnia presented with sudden onset right-sided weakness and aphasia.  In the emergency room she was tachypneic.  Blood pressure stable.  CT angiogram of the head and neck was negative for large vessel occlusion, CT head no acute abnormalities.  Admitted with neurology consultation. Apparently symptoms started when she was arguing with her husband and suddenly started slurring.  Assessment & Plan:   Acute ischemic stroke: Clinical findings, right-sided weakness and aphasia CT head findings, no acute findings MRI of the brain, no acute intracranial abnormality.  Moderate chronic microvascular ischemic disease.  Pituitary 4 mm nodular lesion. CT angiogram head and neck, no large vessel occlusion.  No evidence of acute ischemia or perfusion deficit. 2D echocardiogram, normal. Antiplatelet therapy, patient on Eliquis at home.  Discontinue aspirin.  Go back on Eliquis. LDL 124 .  On simvastatin at home.  Will increase to atorvastatin 40 mg daily. Hemoglobin A1c, 5.3 Seen by telemetry neurology Will be seen by neurology in-house.  History of hyperlipidemia: On Zocor, changed to atorvastatin.  Hypothyroidism, on Synthroid,  GERD: On Protonix  PE and DVT: We will resume Eliquis.  CKD stage IIIb: At about baseline.  Patient had variable exam findings and a very inconsistent neurological exams. Later in the day, she is taking laps around the unit, her voice is clear.  Neurological deficit on presentation do not match with the relatively normal findings on her MRI. May have underlying anxiety or panic attack issues.  Case discussed with neurology, once cleared neurologically, will discharge and send outpatient psychiatric  referral.   DVT prophylaxis:   Eliquis   Code Status: Full code Family Communication: Husband and son at the bedside Disposition Plan: Status is: Observation The patient will require care spanning > 2 midnights and should be moved to inpatient because: Significant neurodeficits, inpatient investigations     Consultants:  Neurology  Procedures:  None  Antimicrobials:  None   Subjective: Patient seen and examined.  When I examined her in the emergency room she was stuttering, tearful that she cannot talk.  She had variable exam on her right upper extremity, sometimes right side was weak sometimes left side was weak. I examined her in the medical floor again, initially walked with a walker then with support and then independently. Her speech is clear now.  Objective: Vitals:   02/05/22 2200 02/05/22 2230 02/05/22 2300 02/06/22 0300  BP: (!) 159/97 (!) 163/91 (!) 172/84 (!) 148/72  Pulse: 78 82 85 71  Resp:   (!) 65 13  Temp:      TempSrc:      SpO2: 100% 100% 97% 99%   No intake or output data in the 24 hours ending 02/06/22 0724 There were no vitals filed for this visit.  Examination:  General exam: Looks comfortable walking around in the hallway with therapies. Patient was very tearful and anxious in the morning but looks better now. Respiratory system: No added sounds. Cardiovascular system: S1 & S2 heard, RRR. No JVD, murmurs, rubs, gallops or clicks. No pedal edema. Gastrointestinal system: Abdomen is nondistended, soft and nontender. No organomegaly or masses felt. Normal bowel sounds heard. Central nervous system: Alert and oriented.  Stuttering and unable to talk in sentences in the morning. Speech is clear by  afternoon. Right upper extremity was weak and she was unable to make fist in the morning. At the same time, she was able to use right hand to grab on objects as well as towels.    Data Reviewed: I have personally reviewed following labs and imaging  studies  CBC: Recent Labs  Lab 02/05/22 2120  WBC 5.8  NEUTROABS 3.9  HGB 12.2  HCT 36.9  MCV 87.2  PLT 938   Basic Metabolic Panel: Recent Labs  Lab 02/05/22 2120  NA 144  K 3.6  CL 113*  CO2 22  GLUCOSE 131*  BUN 27*  CREATININE 1.61*  CALCIUM 9.1   GFR: Estimated Creatinine Clearance: 34.5 mL/min (A) (by C-G formula based on SCr of 1.61 mg/dL (H)). Liver Function Tests: Recent Labs  Lab 02/05/22 2120  AST 18  ALT 13  ALKPHOS 69  BILITOT 0.5  PROT 6.9  ALBUMIN 3.7   No results for input(s): "LIPASE", "AMYLASE" in the last 168 hours. No results for input(s): "AMMONIA" in the last 168 hours. Coagulation Profile: Recent Labs  Lab 02/05/22 2120  INR 1.0   Cardiac Enzymes: No results for input(s): "CKTOTAL", "CKMB", "CKMBINDEX", "TROPONINI" in the last 168 hours. BNP (last 3 results) No results for input(s): "PROBNP" in the last 8760 hours. HbA1C: Recent Labs    02/05/22 2120  HGBA1C 5.3   CBG: Recent Labs  Lab 02/05/22 2101  GLUCAP 133*   Lipid Profile: Recent Labs    02/06/22 0506  CHOL 206*  HDL 58  LDLCALC 124*  TRIG 121  CHOLHDL 3.6   Thyroid Function Tests: No results for input(s): "TSH", "T4TOTAL", "FREET4", "T3FREE", "THYROIDAB" in the last 72 hours. Anemia Panel: No results for input(s): "VITAMINB12", "FOLATE", "FERRITIN", "TIBC", "IRON", "RETICCTPCT" in the last 72 hours. Sepsis Labs: No results for input(s): "PROCALCITON", "LATICACIDVEN" in the last 168 hours.  No results found for this or any previous visit (from the past 240 hour(s)).       Radiology Studies: MR BRAIN WO CONTRAST  Result Date: 02/06/2022 CLINICAL DATA:  Initial evaluation for neuro deficit, stroke suspected. EXAM: MRI HEAD WITHOUT CONTRAST TECHNIQUE: Multiplanar, multiecho pulse sequences of the brain and surrounding structures were obtained without intravenous contrast. COMPARISON:  CTs from 02/05/2022. FINDINGS: Brain: Cerebral volume within normal  limits. Moderate chronic microvascular ischemic disease noted involving the supratentorial cerebral white matter and pons. No evidence for acute or subacute infarct. Gray-white matter differentiation maintained. No areas of chronic cortical infarction. No acute or chronic intracranial blood products. No mass lesion, midline shift or mass effect. No hydrocephalus or extra-axial fluid collection. 4 mm nodular lesion present along the superior aspect of the pituitary gland (series 9, image 12), indeterminate. No significant surrounding regional mass effect. Vascular: Major intracranial vascular flow voids are maintained. Skull and upper cervical spine: Craniocervical junction normal. Bone marrow signal intensity within normal limits. No scalp soft tissue abnormality. Sinuses/Orbits: Globes and orbital soft tissues within normal limits. Mild scattered mucosal thickening noted about the ethmoidal air cells and maxillary sinuses. Trace bilateral mastoid effusions, of doubtful significance. Other: None. IMPRESSION: 1. No acute intracranial abnormality. 2. Moderate chronic microvascular ischemic disease. 3. 4 mm nodular lesion along the superior aspect of the pituitary gland, indeterminate, and of uncertain clinical significance. Correlation with pituitary function tests suggested. Additionally, further evaluation with dedicated pituitary protocol MRI, with and without contrast, could be performed for further evaluation as warranted. Electronically Signed   By: Jeannine Boga M.D.   On: 02/06/2022  02:00   CT ANGIO HEAD NECK W WO CM W PERF (CODE STROKE)  Result Date: 02/05/2022 CLINICAL DATA:  Initial evaluation for neuro deficit, stroke. EXAM: CT ANGIOGRAPHY HEAD AND NECK CT PERFUSION BRAIN TECHNIQUE: Multidetector CT imaging of the head and neck was performed using the standard protocol during bolus administration of intravenous contrast. Multiplanar CT image reconstructions and MIPs were obtained to evaluate the  vascular anatomy. Carotid stenosis measurements (when applicable) are obtained utilizing NASCET criteria, using the distal internal carotid diameter as the denominator. Multiphase CT imaging of the brain was performed following IV bolus contrast injection. Subsequent parametric perfusion maps were calculated using RAPID software. RADIATION DOSE REDUCTION: This exam was performed according to the departmental dose-optimization program which includes automated exposure control, adjustment of the mA and/or kV according to patient size and/or use of iterative reconstruction technique. CONTRAST:  144m OMNIPAQUE IOHEXOL 350 MG/ML SOLN COMPARISON:  Prior head CT from earlier the same day. FINDINGS: CTA NECK FINDINGS Aortic arch: Visualized aortic arch normal caliber with standard 3 vessel morphology. No stenosis about the origin of the great vessels. Extensive atheromatous irregularity with moderate to severe multifocal stenoses noted involving the right subclavian artery beyond the takeoff of the right vertebral artery (series 6, image 105). Superimposed 4 mm outpouching could reflect a penetrating plaque versus small pseudoaneurysm (series 7, image 135). Right carotid system: Right CCA patent without stenosis. Mild calcified plaque about the right carotid bulb without hemodynamically significant greater than 50% stenosis. Right ICA patent distally without stenosis or dissection. Left carotid system: Left CCA patent without stenosis. Mild atheromatous change about the left carotid bulb without hemodynamically significant greater than 50% stenosis. Left ICA patent distally without stenosis or dissection. Vertebral arteries: Both vertebral arteries arise from the subclavian arteries. No proximal subclavian artery stenosis. Left vertebral artery dominant. Vertebral arteries patent without stenosis or dissection. Skeleton: No discrete or worrisome osseous lesions. Other neck: No other acute soft tissue abnormality within the  neck. Right thyroid lobe hypoplastic. Upper chest: Irregular pleuroparenchymal thickening with scarring present at the anterolateral right lung. Persistent irregular loculated collection within this area measuring up to approximate 4 cm, similar to prior. Prior right mastectomy.//Cardiomegaly partially visualized. Few small right pulmonary nodules measuring up to 5 mm partially visualized, grossly similar to previous, indeterminate. Review of the MIP images confirms the above findings CTA HEAD FINDINGS Anterior circulation: Both internal carotid arteries patent to the termini without stenosis or other abnormality. A1 segments, anterior communicating artery complex, and anterior cerebral arteries widely patent. No M1 stenosis or occlusion. No proximal MCA branch occlusion. Distal MCA branches perfused and symmetric. Posterior circulation: Both vertebral arteries patent without stenosis. Left vertebral artery dominant. Both PICA patent. Basilar patent to its distal aspect without stenosis. Superior cerebellar and posterior cerebral arteries patent bilaterally. Venous sinuses: Patent allowing for timing the contrast bolus. Anatomic variants: As above.  No aneurysm. Review of the MIP images confirms the above findings CT Brain Perfusion Findings: ASPECTS: 10 CBF (<30%) Volume: 077mPerfusion (Tmax>6.0s) volume: 49m35mismatch Volume: 49mL24mfarction Location:Negative CT perfusion with no evidence for acute ischemia or other perfusion deficit. IMPRESSION: 1. Negative CTA for large vessel occlusion or other emergent finding. 2. Negative CT perfusion with no evidence for acute ischemia or other perfusion deficit. 3. 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 Superimposed 4 mm outpouching could reflect a penetrating plaque versus small pseudoaneurysm. 4. Additional mild atherosclerotic change elsewhere within  the major arterial vasculature of  the head and neck. No other hemodynamically significant or correctable stenosis. 5. Chronic pulmonary findings as detailed above, similar as compared to prior CT from 08/22/2021. Results communicated by telephone at the time of interpretation on 02/05/2022 at approximately 9:15 p.m. to provider PHILLIP STAFFORD , who verbally acknowledged these results. Electronically Signed   By: Jeannine Boga M.D.   On: 02/05/2022 21:41   CT HEAD CODE STROKE WO CONTRAST  Result Date: 02/05/2022 CLINICAL DATA:  Code stroke. Initial evaluation for neuro deficit, stroke suspected. EXAM: CT HEAD WITHOUT CONTRAST TECHNIQUE: Contiguous axial images were obtained from the base of the skull through the vertex without intravenous contrast. RADIATION DOSE REDUCTION: This exam was performed according to the departmental dose-optimization program which includes automated exposure control, adjustment of the mA and/or kV according to patient size and/or use of iterative reconstruction technique. COMPARISON:  None Available. FINDINGS: Brain: Age-related cerebral atrophy with chronic small vessel ischemic disease. No acute intracranial hemorrhage. No visible large vessel territory infarct. No mass lesion, mass effect or midline shift. No hydrocephalus or extra-axial fluid collection. Vascular: No hyperdense vessel. Skull: Scalp soft tissues and calvarium within normal limits. Sinuses/Orbits: Globes and orbital soft tissues within normal limits. Paranasal sinuses are largely clear. No mastoid effusion. Other: None. ASPECTS [facility] Stroke Program Early CT Score) - Ganglionic level infarction (caudate, lentiform nuclei, internal capsule, insula, M1-M3 cortex): 7 - Supraganglionic infarction (M4-M6 cortex): 3 Total score (0-10 with 10 being normal): 10 IMPRESSION: 1. No acute intracranial abnormality. 2. ASPECTS is 10. 3. Underlying atrophy with chronic small vessel ischemic disease. Critical Value/emergent results were called by telephone  at the time of interpretation on 02/05/2022 at 9:20 pm to provider PHILLIP STAFFORD , who verbally acknowledged these results. Electronically Signed   By: Jeannine Boga M.D.   On: 02/05/2022 21:23        Scheduled Meds:  [START ON 02/07/2022]  stroke: early stages of recovery book   Does not apply Once   acetaminophen  650 mg Rectal STAT   aspirin  300 mg Rectal Once   sodium chloride flush  3 mL Intravenous Once   Continuous Infusions:   LOS: 0 days    Time spent: 35 minutes    Barb Merino, MD [facility] Pager [phone]

## 2022-02-06 NOTE — ED Notes (Signed)
Family updated as to patient's status. This RN spoke with Belenda Cruise, patients daughter

## 2022-02-07 DIAGNOSIS — I639 Cerebral infarction, unspecified: Secondary | ICD-10-CM | POA: Diagnosis not present

## 2022-02-07 MED ORDER — ATORVASTATIN CALCIUM 40 MG PO TABS: 40.0000 mg | ORAL_TABLET | Freq: Every day | ORAL | 11 refills | Status: DC

## 2022-02-07 NOTE — Discharge Summary (Signed)
Physician Discharge Summary  [name] [name] QQI:[id number] DOB: 1950/12/11 DOA: 02/05/2022  PCP: Rusty Aus, MD  Admit date: 02/05/2022 Discharge date: 02/07/2022  Admitted From: Home Disposition: Home  Recommendations for Outpatient Follow-up:  Follow up with PCP in 1-2 weeks Outpatient neurology follow-up  Home Health: None Equipment/Devices: None  Discharge Condition: Stable CODE STATUS: Full code Diet recommendation: Low-salt diet  Discharge summary: 71 year old female with history of hyperlipidemia, GERD, neuropathy, restless leg syndrome, history of PE and DVT on Eliquis and insomnia presented with sudden onset right-sided weakness and aphasia.  In the emergency room she was tachypneic.  Blood pressure stable.  CT angiogram of the head and neck was negative for large vessel occlusion, CT head no acute abnormalities.  Admitted with neurology consultation. Patient continues to have stuttering 12 to 14 hours then suddenly improved and speech became normal.  Transient ischemic attack: Staggering TIA suspected. Clinical findings, right-sided weakness and aphasia, stuttering. CT head findings, no acute findings MRI of the brain, no acute intracranial abnormality.  Moderate chronic microvascular ischemic disease.  Pituitary 4 mm nodular lesion. CT angiogram head and neck, no large vessel occlusion.  No evidence of acute ischemia or perfusion deficit. 2D echocardiogram, normal. Antiplatelet therapy, patient on Eliquis at home.  Go back on Eliquis. LDL 124 .  On simvastatin at home.  Will increase to atorvastatin 40 mg daily. Hemoglobin A1c, 5.3, no indication for treatment. Seen by neurology who recommended continue anticoagulation and high intensity statin.  History of hyperlipidemia: On Zocor, changed to atorvastatin.  Hypothyroidism, on Synthroid,   GERD: On Protonix   PE and DVT: We will resume Eliquis.   CKD stage IIIb: At about baseline.   Patient had rapid  improvement of his speech.  She does have some remaining right distal muscle weakness otherwise neurologically mostly negative findings now.  Discharged with outpatient follow-up.  Referral sent to neurology at [facility].   Discharge Diagnoses:  Principal Problem:   Acute ischemic stroke [facility]) Active Problems:   Pulmonary embolism (HCC)   DVT (deep venous thrombosis) (HCC)   AKI (acute kidney injury) ([place])   Acquired hypothyroidism   Mixed hyperlipidemia   GERD (gastroesophageal reflux disease)   Anxiety    Discharge Instructions  Discharge Instructions     Diet - low sodium heart healthy   Complete by: As directed    Increase activity slowly   Complete by: As directed       Allergies as of 02/07/2022   No Known Allergies      Medication List     STOP taking these medications    LORazepam 1 MG tablet Commonly known as: ATIVAN   simvastatin 20 MG tablet Commonly known as: ZOCOR       TAKE these medications    acetaminophen 325 MG tablet Commonly known as: TYLENOL Take 325 mg by mouth every 4 (four) hours as needed.   apixaban 5 MG Tabs tablet Commonly known as: ELIQUIS Take 1 tablet (5 mg total) by mouth 2 (two) times daily.   atorvastatin 40 MG tablet Commonly known as: Lipitor Take 1 tablet (40 mg total) by mouth daily.   Cholecalciferol 125 MCG (5000 UT) Tabs Take by mouth every morning.   Cyanocobalamin 500 MCG Tbdp Take by mouth every morning.   gabapentin 300 MG capsule Commonly known as: NEURONTIN Take 600 mg by mouth at bedtime.   levothyroxine 125 MCG tablet Commonly known as: SYNTHROID Take 125 mcg by mouth.   omeprazole 20  MG capsule Commonly known as: PRILOSEC Take 20 mg by mouth daily.   temazepam 15 MG capsule Commonly known as: RESTORIL Take 15 mg by mouth at bedtime.   traZODone 50 MG tablet Commonly known as: DESYREL Take 50 mg by mouth at bedtime.               Durable Medical Equipment  (From  admission, onward)           Start     Ordered   02/06/22 2146  For home use only DME Walker rolling  Once       Question Answer Comment  Walker: With [place]   Patient needs a walker to treat with the following condition Generalized weakness      02/06/22 2145   02/06/22 2146  For home use only DME 3 n 1  Once        02/06/22 2145            No Known Allergies  Consultations: Neurology   Procedures/Studies: ECHOCARDIOGRAM COMPLETE  Result Date: 02/06/2022    ECHOCARDIOGRAM REPORT   Patient Name:   [name] [name] Date of Exam: 02/06/2022 Medical Rec #:  [id number]      Height:       66.0 in Accession #:    [id number]     Weight:       185.0 lb Date of Birth:  12/09/1950     BSA:          1.935 m Patient Age:    34 years       BP:           148/72 mmHg Patient Gender: F              HR:           57 bpm. Exam Location:  [facility] Procedure: 2D Echo, Color Doppler and Cardiac Doppler Indications:     I63.9 Stroke  History:         Patient has prior history of Echocardiogram examinations. Risk                  Factors:HCL.  Sonographer:     Charmayne Sheer Referring Phys:  [id number] OLADAPO ADEFESO Diagnosing Phys: Serafina Royals MD  Sonographer Comments: Suboptimal apical window and suboptimal subcostal window. Image acquisition challenging due to patient body habitus. IMPRESSIONS  1. Left ventricular ejection fraction, by estimation, is 60 to 65%. The left ventricle has normal function. The left ventricle has no regional wall motion abnormalities. Left ventricular diastolic parameters were normal.  2. Right ventricular systolic function is normal. The right ventricular size is normal.  3. The mitral valve is normal in structure. Trivial mitral valve regurgitation.  4. The aortic valve is normal in structure. Aortic valve regurgitation is not visualized. FINDINGS  Left Ventricle: Left ventricular ejection fraction, by estimation, is 60 to 65%. The left ventricle has normal function. The left  ventricle has no regional wall motion abnormalities. The left ventricular internal cavity size was normal in size. There is  no left ventricular hypertrophy. Left ventricular diastolic parameters were normal. Right Ventricle: The right ventricular size is normal. No increase in right ventricular wall thickness. Right ventricular systolic function is normal. Left Atrium: Left atrial size was normal in size. Right Atrium: Right atrial size was normal in size. Pericardium: There is no evidence of pericardial effusion. Mitral Valve: The mitral valve is normal in structure. Trivial mitral valve regurgitation. Tricuspid Valve: The  tricuspid valve is normal in structure. Tricuspid valve regurgitation is trivial. Aortic Valve: The aortic valve is normal in structure. Aortic valve regurgitation is not visualized. Aortic valve mean gradient measures 2.0 mmHg. Aortic valve peak gradient measures 3.7 mmHg. Aortic valve area, by VTI measures 2.85 cm. Pulmonic Valve: The pulmonic valve was normal in structure. Pulmonic valve regurgitation is not visualized. Aorta: The aortic root and ascending aorta are structurally normal, with no evidence of dilitation. IAS/Shunts: No atrial level shunt detected by color flow Doppler.  LEFT VENTRICLE PLAX 2D LVIDd:         5.21 cm      Diastology LVIDs:         3.93 cm      LV e' medial:    4.90 cm/s LV PW:         1.29 cm      LV E/e' medial:  13.9 LV IVS:        0.78 cm      LV e' lateral:   7.07 cm/s LVOT diam:     2.10 cm      LV E/e' lateral: 9.6 LV SV:         48 LV SV Index:   25 LVOT Area:     3.46 cm  LV Volumes (MOD) LV vol d, MOD A2C: 60.5 ml LV vol d, MOD A4C: 127.0 ml LV vol s, MOD A4C: 56.5 ml LV SV MOD A4C:     127.0 ml RIGHT VENTRICLE RV Basal diam:  3.35 cm RV S prime:     14.10 cm/s LEFT ATRIUM           Index        RIGHT ATRIUM           Index LA diam:      3.60 cm 1.86 cm/m   RA Area:     15.50 cm LA Vol (A2C): 20.0 ml 10.34 ml/m  RA Volume:   41.00 ml  21.19 ml/m LA  Vol (A4C): 46.1 ml 23.83 ml/m  AORTIC VALVE                    PULMONIC VALVE AV Area (Vmax):    2.49 cm     PV Vmax:       0.80 m/s AV Area (Vmean):   2.53 cm     PV Peak grad:  2.6 mmHg AV Area (VTI):     2.85 cm AV Vmax:           96.40 cm/s AV Vmean:          63.000 cm/s AV VTI:            0.169 m AV Peak Grad:      3.7 mmHg AV Mean Grad:      2.0 mmHg LVOT Vmax:         69.20 cm/s LVOT Vmean:        46.000 cm/s LVOT VTI:          0.139 m LVOT/AV VTI ratio: 0.82  AORTA Ao Root diam: 3.10 cm MITRAL VALVE MV Area (PHT): 4.25 cm     SHUNTS MV Decel Time: 179 msec     Systemic VTI:  0.14 m MV E velocity: 67.95 cm/s   Systemic Diam: 2.10 cm MV A velocity: 102.85 cm/s MV E/A ratio:  0.66 Serafina Royals MD Electronically signed by Serafina Royals MD Signature Date/Time: 02/06/2022/12:20:51 PM    Final  MR BRAIN WO CONTRAST  Result Date: 02/06/2022 CLINICAL DATA:  Initial evaluation for neuro deficit, stroke suspected. EXAM: MRI HEAD WITHOUT CONTRAST TECHNIQUE: Multiplanar, multiecho pulse sequences of the brain and surrounding structures were obtained without intravenous contrast. COMPARISON:  CTs from 02/05/2022. FINDINGS: Brain: Cerebral volume within normal limits. Moderate chronic microvascular ischemic disease noted involving the supratentorial cerebral white matter and pons. No evidence for acute or subacute infarct. Gray-white matter differentiation maintained. No areas of chronic cortical infarction. No acute or chronic intracranial blood products. No mass lesion, midline shift or mass effect. No hydrocephalus or extra-axial fluid collection. 4 mm nodular lesion present along the superior aspect of the pituitary gland (series 9, image 12), indeterminate. No significant surrounding regional mass effect. Vascular: Major intracranial vascular flow voids are maintained. Skull and upper cervical spine: Craniocervical junction normal. Bone marrow signal intensity within normal limits. No scalp soft tissue  abnormality. Sinuses/Orbits: Globes and orbital soft tissues within normal limits. Mild scattered mucosal thickening noted about the ethmoidal air cells and maxillary sinuses. Trace bilateral mastoid effusions, of doubtful significance. Other: None. IMPRESSION: 1. No acute intracranial abnormality. 2. Moderate chronic microvascular ischemic disease. 3. 4 mm nodular lesion along the superior aspect of the pituitary gland, indeterminate, and of uncertain clinical significance. Correlation with pituitary function tests suggested. Additionally, further evaluation with dedicated pituitary protocol MRI, with and without contrast, could be performed for further evaluation as warranted. Electronically Signed   By: Jeannine Boga M.D.   On: 02/06/2022 02:00   CT ANGIO HEAD NECK W WO CM W PERF (CODE STROKE)  Result Date: 02/05/2022 CLINICAL DATA:  Initial evaluation for neuro deficit, stroke. EXAM: CT ANGIOGRAPHY HEAD AND NECK CT PERFUSION BRAIN TECHNIQUE: Multidetector CT imaging of the head and neck was performed using the standard protocol during bolus administration of intravenous contrast. Multiplanar CT image reconstructions and MIPs were obtained to evaluate the vascular anatomy. Carotid stenosis measurements (when applicable) are obtained utilizing NASCET criteria, using the distal internal carotid diameter as the denominator. Multiphase CT imaging of the brain was performed following IV bolus contrast injection. Subsequent parametric perfusion maps were calculated using RAPID software. RADIATION DOSE REDUCTION: This exam was performed according to the departmental dose-optimization program which includes automated exposure control, adjustment of the mA and/or kV according to patient size and/or use of iterative reconstruction technique. CONTRAST:  11m OMNIPAQUE IOHEXOL 350 MG/ML SOLN COMPARISON:  Prior head CT from earlier the same day. FINDINGS: CTA NECK FINDINGS Aortic arch: Visualized aortic arch  normal caliber with standard 3 vessel morphology. No stenosis about the origin of the great vessels. Extensive atheromatous irregularity with moderate to severe multifocal stenoses noted involving the right subclavian artery beyond the takeoff of the right vertebral artery (series 6, image 105). Superimposed 4 mm outpouching could reflect a penetrating plaque versus small pseudoaneurysm (series 7, image 135). Right carotid system: Right CCA patent without stenosis. Mild calcified plaque about the right carotid bulb without hemodynamically significant greater than 50% stenosis. Right ICA patent distally without stenosis or dissection. Left carotid system: Left CCA patent without stenosis. Mild atheromatous change about the left carotid bulb without hemodynamically significant greater than 50% stenosis. Left ICA patent distally without stenosis or dissection. Vertebral arteries: Both vertebral arteries arise from the subclavian arteries. No proximal subclavian artery stenosis. Left vertebral artery dominant. Vertebral arteries patent without stenosis or dissection. Skeleton: No discrete or worrisome osseous lesions. Other neck: No other acute soft tissue abnormality within the neck. Right thyroid lobe hypoplastic. Upper  chest: Irregular pleuroparenchymal thickening with scarring present at the anterolateral right lung. Persistent irregular loculated collection within this area measuring up to approximate 4 cm, similar to prior. Prior right mastectomy.//Cardiomegaly partially visualized. Few small right pulmonary nodules measuring up to 5 mm partially visualized, grossly similar to previous, indeterminate. Review of the MIP images confirms the above findings CTA HEAD FINDINGS Anterior circulation: Both internal carotid arteries patent to the termini without stenosis or other abnormality. A1 segments, anterior communicating artery complex, and anterior cerebral arteries widely patent. No M1 stenosis or occlusion. No  proximal MCA branch occlusion. Distal MCA branches perfused and symmetric. Posterior circulation: Both vertebral arteries patent without stenosis. Left vertebral artery dominant. Both PICA patent. Basilar patent to its distal aspect without stenosis. Superior cerebellar and posterior cerebral arteries patent bilaterally. Venous sinuses: Patent allowing for timing the contrast bolus. Anatomic variants: As above.  No aneurysm. Review of the MIP images confirms the above findings CT Brain Perfusion Findings: ASPECTS: 10 CBF (<30%) Volume: 42m Perfusion (Tmax>6.0s) volume: 053mMismatch Volume: 22m63mnfarction Location:Negative CT perfusion with no evidence for acute ischemia or other perfusion deficit. IMPRESSION: 1. Negative CTA for large vessel occlusion or other emergent finding. 2. Negative CT perfusion with no evidence for acute ischemia or other perfusion deficit. 3. 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 Superimposed 4 mm outpouching could reflect a penetrating plaque versus small pseudoaneurysm. 4. Additional mild atherosclerotic change elsewhere within the major arterial vasculature of the head and neck. No other hemodynamically significant or correctable stenosis. 5. Chronic pulmonary findings as detailed above, similar as compared to prior CT from 08/22/2021. Results communicated by telephone at the time of interpretation on 02/05/2022 at approximately 9:15 p.m. to provider PHILLIP STAFFORD , who verbally acknowledged these results. Electronically Signed   By: BenJeannine BogaD.   On: 02/05/2022 21:41   CT HEAD CODE STROKE WO CONTRAST  Result Date: 02/05/2022 CLINICAL DATA:  Code stroke. Initial evaluation for neuro deficit, stroke suspected. EXAM: CT HEAD WITHOUT CONTRAST TECHNIQUE: Contiguous axial images were obtained from the base of the skull through the vertex without intravenous contrast. RADIATION DOSE  REDUCTION: This exam was performed according to the departmental dose-optimization program which includes automated exposure control, adjustment of the mA and/or kV according to patient size and/or use of iterative reconstruction technique. COMPARISON:  None Available. FINDINGS: Brain: Age-related cerebral atrophy with chronic small vessel ischemic disease. No acute intracranial hemorrhage. No visible large vessel territory infarct. No mass lesion, mass effect or midline shift. No hydrocephalus or extra-axial fluid collection. Vascular: No hyperdense vessel. Skull: Scalp soft tissues and calvarium within normal limits. Sinuses/Orbits: Globes and orbital soft tissues within normal limits. Paranasal sinuses are largely clear. No mastoid effusion. Other: None. ASPECTS ([facility] Program Early CT Score) - Ganglionic level infarction (caudate, lentiform nuclei, internal capsule, insula, M1-M3 cortex): 7 - Supraganglionic infarction (M4-M6 cortex): 3 Total score (0-10 with 10 being normal): 10 IMPRESSION: 1. No acute intracranial abnormality. 2. ASPECTS is 10. 3. Underlying atrophy with chronic small vessel ischemic disease. Critical Value/emergent results were called by telephone at the time of interpretation on 02/05/2022 at 9:20 pm to provider PHILLIP STAFFORD , who verbally acknowledged these results. Electronically Signed   By: BenJeannine BogaD.   On: 02/05/2022 21:23   CT HEAD WO CONTRAST  Result Date: 01/23/2022 CLINICAL DATA:  Dizziness, non-specific EXAM: CT HEAD WITHOUT CONTRAST TECHNIQUE: Contiguous axial images were obtained  from the base of the skull through the vertex without intravenous contrast. RADIATION DOSE REDUCTION: This exam was performed according to the departmental dose-optimization program which includes automated exposure control, adjustment of the mA and/or kV according to patient size and/or use of iterative reconstruction technique. COMPARISON:  11/20/2021 FINDINGS: Brain:  Normal anatomic configuration. Parenchymal volume loss is commensurate with the patient's age. Stable mild periventricular white matter changes are present likely reflecting the sequela of small vessel ischemia. Tiny remote lacunar infarcts noted within the anterior limb the right internal capsule. No abnormal intra or extra-axial mass lesion or fluid collection. No abnormal mass effect or midline shift. No evidence of acute intracranial hemorrhage or infarct. Ventricular size is normal. Cerebellum unremarkable. Vascular: No asymmetric hyperdense vasculature at the skull base. Skull: Intact Sinuses/Orbits: Paranasal sinuses are clear. Orbits are unremarkable. Other: Mastoid air cells and middle ear cavities are clear. IMPRESSION: No acute intracranial abnormality. Stable senescent changes. Electronically Signed   By: Fidela Salisbury M.D.   On: 01/23/2022 20:41   (Echo, Carotid, EGD, Colonoscopy, ERCP)    Subjective: Patient seen and examined.  No overnight events.  Eager to go home.  Getting up and walking around but using a walker in case for safety.  Denies any sleeping issues overnight.   Discharge Exam: Vitals:   02/07/22 0420 02/07/22 0746  BP: 128/64 114/88  Pulse: 83 88  Resp: 16 16  Temp: 97.8 F (36.6 C) 97.9 F (36.6 C)  SpO2: 95% 97%   Vitals:   02/06/22 1954 02/07/22 0007 02/07/22 0420 02/07/22 0746  BP: (!) 142/81 113/71 128/64 114/88  Pulse: 80 91 83 88  Resp: '16 16 16 16  '$ Temp: 98.2 F (36.8 C) 97.6 F (36.4 C) 97.8 F (36.6 C) 97.9 F (36.6 C)  TempSrc: Oral     SpO2: 95% 96% 95% 97%  Weight:      Height:        General: Pt is alert, awake, not in acute distress Cardiovascular: RRR, S1/S2 +, no rubs, no gallops Respiratory: CTA bilaterally, no wheezing, no rhonchi Abdominal: Soft, NT, ND, bowel sounds + Extremities: no edema, no cyanosis Pleasant.  Conversant.  Denies any delusions hallucinations.  Denies any depression symptoms. Speech is fluent.  Thoughts are  clear. Cranial nerves with no deficits. Motor and sensory exam, right distal hand grip strength slightly less than left otherwise normal.    The results of significant diagnostics from this hospitalization (including imaging, microbiology, ancillary and laboratory) are listed below for reference.     Microbiology: No results found for this or any previous visit (from the past 240 hour(s)).   Labs: BNP (last 3 results) No results for input(s): "BNP" in the last 8760 hours. Basic Metabolic Panel: Recent Labs  Lab 02/05/22 2120  NA 144  K 3.6  CL 113*  CO2 22  GLUCOSE 131*  BUN 27*  CREATININE 1.61*  CALCIUM 9.1   Liver Function Tests: Recent Labs  Lab 02/05/22 2120  AST 18  ALT 13  ALKPHOS 69  BILITOT 0.5  PROT 6.9  ALBUMIN 3.7   No results for input(s): "LIPASE", "AMYLASE" in the last 168 hours. No results for input(s): "AMMONIA" in the last 168 hours. CBC: Recent Labs  Lab 02/05/22 2120  WBC 5.8  NEUTROABS 3.9  HGB 12.2  HCT 36.9  MCV 87.2  PLT 187   Cardiac Enzymes: No results for input(s): "CKTOTAL", "CKMB", "CKMBINDEX", "TROPONINI" in the last 168 hours. BNP: Invalid input(s): "POCBNP" CBG:  Recent Labs  Lab 02/05/22 2101  GLUCAP 133*   D-Dimer No results for input(s): "DDIMER" in the last 72 hours. Hgb A1c Recent Labs    02/05/22 2120  HGBA1C 5.3   Lipid Profile Recent Labs    02/06/22 0506  CHOL 206*  HDL 58  LDLCALC 124*  TRIG 121  CHOLHDL 3.6   Thyroid function studies No results for input(s): "TSH", "T4TOTAL", "T3FREE", "THYROIDAB" in the last 72 hours.  Invalid input(s): "FREET3" Anemia work up No results for input(s): "VITAMINB12", "FOLATE", "FERRITIN", "TIBC", "IRON", "RETICCTPCT" in the last 72 hours. Urinalysis    Component Value Date/Time   COLORURINE COLORLESS (A) 11/20/2021 0321   APPEARANCEUR CLEAR (A) 11/20/2021 0321   LABSPEC 1.008 11/20/2021 0321   PHURINE 5.0 11/20/2021 0321   GLUCOSEU NEGATIVE 11/20/2021  0321   HGBUR NEGATIVE 11/20/2021 0321   BILIRUBINUR NEGATIVE 11/20/2021 0321   KETONESUR NEGATIVE 11/20/2021 0321   PROTEINUR NEGATIVE 11/20/2021 0321   NITRITE NEGATIVE 11/20/2021 0321   LEUKOCYTESUR NEGATIVE 11/20/2021 0321   Sepsis Labs Recent Labs  Lab 02/05/22 2120  WBC 5.8   Microbiology No results found for this or any previous visit (from the past 240 hour(s)).   Time coordinating discharge: 35 minutes  SIGNED:   Barb Merino, MD  [facility] 02/07/2022, 8:52 AM

## 2022-02-07 NOTE — Progress Notes (Signed)
Pt discharged home with family. PIV and tele removed. Pt belongings returned. Discharge packet reviewed with patient, verbalizes understanding.

## 2022-02-07 NOTE — Plan of Care (Signed)
  Problem: Education: Goal: Knowledge of General Education information will improve Description: Including pain rating scale, medication(s)/side effects and non-pharmacologic comfort measures 02/07/2022 0914 by Mancel Bale, RN Outcome: Completed/Met 02/07/2022 0907 by Mancel Bale, RN Outcome: Progressing   Problem: Health Behavior/Discharge Planning: Goal: Ability to manage health-related needs will improve 02/07/2022 0914 by Mancel Bale, RN Outcome: Completed/Met 02/07/2022 0907 by Mancel Bale, RN Outcome: Progressing   Problem: Clinical Measurements: Goal: Ability to maintain clinical measurements within normal limits will improve 02/07/2022 0914 by Mancel Bale, RN Outcome: Completed/Met 02/07/2022 0907 by Mancel Bale, RN Outcome: Progressing Goal: Will remain free from infection 02/07/2022 0914 by Mancel Bale, RN Outcome: Completed/Met 02/07/2022 0907 by Mancel Bale, RN Outcome: Progressing Goal: Diagnostic test results will improve 02/07/2022 0914 by Mancel Bale, RN Outcome: Completed/Met 02/07/2022 0907 by Mancel Bale, RN Outcome: Progressing Goal: Respiratory complications will improve 02/07/2022 0914 by Mancel Bale, RN Outcome: Completed/Met 02/07/2022 0907 by Mancel Bale, RN Outcome: Progressing Goal: Cardiovascular complication will be avoided 02/07/2022 0914 by Mancel Bale, RN Outcome: Completed/Met 02/07/2022 0907 by Mancel Bale, RN Outcome: Progressing   Problem: Activity: Goal: Risk for activity intolerance will decrease 02/07/2022 0914 by Mancel Bale, RN Outcome: Completed/Met 02/07/2022 0907 by Mancel Bale, RN Outcome: Progressing   Problem: Nutrition: Goal: Adequate nutrition will be maintained 02/07/2022 0914 by Mancel Bale, RN Outcome: Completed/Met 02/07/2022 0907 by Mancel Bale, RN Outcome: Progressing   Problem: Coping: Goal: Level of anxiety will decrease 02/07/2022  0914 by Mancel Bale, RN Outcome: Completed/Met 02/07/2022 0907 by Mancel Bale, RN Outcome: Progressing   Problem: Elimination: Goal: Will not experience complications related to bowel motility 02/07/2022 0914 by Mancel Bale, RN Outcome: Completed/Met 02/07/2022 0907 by Mancel Bale, RN Outcome: Progressing Goal: Will not experience complications related to urinary retention 02/07/2022 0914 by Mancel Bale, RN Outcome: Completed/Met 02/07/2022 0907 by Mancel Bale, RN Outcome: Progressing   Problem: Pain Managment: Goal: General experience of comfort will improve 02/07/2022 0914 by Mancel Bale, RN Outcome: Completed/Met 02/07/2022 0907 by Mancel Bale, RN Outcome: Progressing   Problem: Safety: Goal: Ability to remain free from injury will improve 02/07/2022 0914 by Mancel Bale, RN Outcome: Completed/Met 02/07/2022 0907 by Mancel Bale, RN Outcome: Progressing   Problem: Skin Integrity: Goal: Risk for impaired skin integrity will decrease 02/07/2022 0914 by Mancel Bale, RN Outcome: Completed/Met 02/07/2022 0907 by Mancel Bale, RN Outcome: Progressing

## 2022-02-07 NOTE — Plan of Care (Signed)

## 2022-06-14 ENCOUNTER — Other Ambulatory Visit: Payer: Self-pay | Admitting: Internal Medicine

## 2022-06-14 DIAGNOSIS — D352 Benign neoplasm of pituitary gland: Secondary | ICD-10-CM

## 2022-07-02 ENCOUNTER — Ambulatory Visit
Admission: RE | Admit: 2022-07-02 | Discharge: 2022-07-02 | Disposition: A | Discharge status: Home / Self Care | Payer: Medicare Other | Source: Ambulatory Visit | Attending: Internal Medicine | Admitting: Internal Medicine

## 2022-07-02 DIAGNOSIS — D352 Benign neoplasm of pituitary gland: Secondary | ICD-10-CM | POA: Insufficient documentation

## 2023-03-20 ENCOUNTER — Emergency Department: Payer: Medicare Other

## 2023-03-20 ENCOUNTER — Emergency Department
Admission: EM | Admit: 2023-03-20 | Discharge: 2023-03-20 | Disposition: A | Discharge status: Home / Self Care | Payer: Medicare Other | Attending: Emergency Medicine | Admitting: Emergency Medicine

## 2023-03-20 ENCOUNTER — Other Ambulatory Visit: Payer: Self-pay

## 2023-03-20 ENCOUNTER — Ambulatory Visit: Payer: Medicare Other

## 2023-03-20 DIAGNOSIS — R531 Weakness: Secondary | ICD-10-CM | POA: Insufficient documentation

## 2023-03-20 DIAGNOSIS — Z923 Personal history of irradiation: Secondary | ICD-10-CM | POA: Diagnosis not present

## 2023-03-20 DIAGNOSIS — R2990 Unspecified symptoms and signs involving the nervous system: Secondary | ICD-10-CM

## 2023-03-20 DIAGNOSIS — R569 Unspecified convulsions: Secondary | ICD-10-CM

## 2023-03-20 DIAGNOSIS — F0390 Unspecified dementia without behavioral disturbance: Secondary | ICD-10-CM | POA: Diagnosis not present

## 2023-03-20 DIAGNOSIS — R4701 Aphasia: Secondary | ICD-10-CM | POA: Insufficient documentation

## 2023-03-20 DIAGNOSIS — Z86711 Personal history of pulmonary embolism: Secondary | ICD-10-CM | POA: Insufficient documentation

## 2023-03-20 DIAGNOSIS — Z853 Personal history of malignant neoplasm of breast: Secondary | ICD-10-CM | POA: Insufficient documentation

## 2023-03-20 DIAGNOSIS — Z7901 Long term (current) use of anticoagulants: Secondary | ICD-10-CM | POA: Insufficient documentation

## 2023-03-20 DIAGNOSIS — Z9221 Personal history of antineoplastic chemotherapy: Secondary | ICD-10-CM | POA: Diagnosis not present

## 2023-03-20 DIAGNOSIS — Z79899 Other long term (current) drug therapy: Secondary | ICD-10-CM | POA: Diagnosis not present

## 2023-03-20 DIAGNOSIS — R7309 Other abnormal glucose: Secondary | ICD-10-CM | POA: Insufficient documentation

## 2023-03-20 DIAGNOSIS — R4789 Other speech disturbances: Secondary | ICD-10-CM

## 2023-03-20 DIAGNOSIS — E039 Hypothyroidism, unspecified: Secondary | ICD-10-CM | POA: Diagnosis not present

## 2023-03-20 DIAGNOSIS — F0394 Unspecified dementia, unspecified severity, with anxiety: Secondary | ICD-10-CM

## 2023-03-20 LAB — CBC
HCT: 41.5 % (ref 36.0–46.0)
Hemoglobin: 13.4 g/dL (ref 12.0–15.0)
MCH: 27.5 pg (ref 26.0–34.0)
MCHC: 32.3 g/dL (ref 30.0–36.0)
MCV: 85.2 fL (ref 80.0–100.0)
Platelets: 184 10*3/uL (ref 150–400)
RBC: 4.87 MIL/uL (ref 3.87–5.11)
RDW: 14.6 % (ref 11.5–15.5)
WBC: 5.5 10*3/uL (ref 4.0–10.5)
nRBC: 0 % (ref 0.0–0.2)

## 2023-03-20 LAB — COMPREHENSIVE METABOLIC PANEL
ALT: 15 U/L (ref 0–44)
AST: 22 U/L (ref 15–41)
Albumin: 3.8 g/dL (ref 3.5–5.0)
Alkaline Phosphatase: 88 U/L (ref 38–126)
Anion gap: 11 (ref 5–15)
BUN: 19 mg/dL (ref 8–23)
CO2: 23 mmol/L (ref 22–32)
Calcium: 8.9 mg/dL (ref 8.9–10.3)
Chloride: 108 mmol/L (ref 98–111)
Creatinine, Ser: 1.32 mg/dL — ABNORMAL HIGH (ref 0.44–1.00)
GFR, Estimated: 43 mL/min — ABNORMAL LOW (ref >60)
Glucose, Bld: 123 mg/dL — ABNORMAL HIGH (ref 70–99)
Potassium: 4 mmol/L (ref 3.5–5.1)
Sodium: 142 mmol/L (ref 135–145)
Total Bilirubin: 1.1 mg/dL (ref 0.3–1.2)
Total Protein: 7.4 g/dL (ref 6.5–8.1)

## 2023-03-20 LAB — I-STAT CREATININE, ED: Creatinine, Ser: 1.4 mg/dL — ABNORMAL HIGH (ref 0.44–1.00)

## 2023-03-20 LAB — DIFFERENTIAL
Abs Immature Granulocytes: 0.03 10*3/uL (ref 0.00–0.07)
Basophils Absolute: 0 10*3/uL (ref 0.0–0.1)
Basophils Relative: 1 %
Eosinophils Absolute: 0.1 10*3/uL (ref 0.0–0.5)
Eosinophils Relative: 1 %
Immature Granulocytes: 1 %
Lymphocytes Relative: 16 %
Lymphs Abs: 0.9 10*3/uL (ref 0.7–4.0)
Monocytes Absolute: 0.3 10*3/uL (ref 0.1–1.0)
Monocytes Relative: 6 %
Neutro Abs: 4.2 10*3/uL (ref 1.7–7.7)
Neutrophils Relative %: 75 %

## 2023-03-20 LAB — PROTIME-INR
INR: 1.1 (ref 0.8–1.2)
Prothrombin Time: 13.9 seconds (ref 11.4–15.2)

## 2023-03-20 LAB — ETHANOL: Alcohol, Ethyl (B): <10 mg/dL (ref <10)

## 2023-03-20 LAB — CBG MONITORING, ED: Glucose-Capillary: 121 mg/dL — ABNORMAL HIGH (ref 70–99)

## 2023-03-20 LAB — APTT: aPTT: 27 seconds (ref 24–36)

## 2023-03-20 MED ORDER — LEVOTHYROXINE SODIUM 50 MCG PO TABS: 125.0000 ug | ORAL_TABLET | Freq: Every day | ORAL | Status: DC

## 2023-03-20 MED ORDER — ACETAMINOPHEN 325 MG PO TABS: 650.0000 mg | ORAL_TABLET | ORAL | Status: DC | PRN

## 2023-03-20 MED ORDER — ACETAMINOPHEN 325 MG RE SUPP: 650.0000 mg | RECTAL | Status: DC | PRN

## 2023-03-20 MED ORDER — SENNOSIDES-DOCUSATE SODIUM 8.6-50 MG PO TABS: 1.0000 | ORAL_TABLET | Freq: Every evening | ORAL | Status: DC | PRN

## 2023-03-20 MED ORDER — ACETAMINOPHEN 160 MG/5ML PO SOLN: 650.0000 mg | ORAL | Status: DC | PRN

## 2023-03-20 MED ORDER — GADOBUTROL 1 MMOL/ML IV SOLN
8.0000 mL | Freq: Once | INTRAVENOUS | Status: AC | PRN
  Administered 2023-03-20: 8 mL via INTRAVENOUS

## 2023-03-20 MED ORDER — ENOXAPARIN SODIUM 40 MG/0.4ML IJ SOSY: 40.0000 mg | PREFILLED_SYRINGE | INTRAMUSCULAR | Status: DC

## 2023-03-20 MED ORDER — STROKE: EARLY STAGES OF RECOVERY BOOK: Freq: Once | Status: DC

## 2023-03-20 NOTE — ED Triage Notes (Signed)
Pt comes in from home via [facility] with complaints of mild right arm drift, and mild right leg drift. Pt's last known well was 0930. Code Stroke activated by EMS, pt arrives with a LVO score of 2. Pt has a history of strokes, but no lingering deficits. Pt is currently taking eloquis, and has no complaints of pain at this time.

## 2023-03-20 NOTE — Code Documentation (Signed)
Stroke Response Nurse Documentation Code Documentation  [name] [name] is a 72 y.o. female arriving to [facility] via [facility] on 03/20/2023 with past medical hx of cancer, GERD, hypercholesteremia, peripheral neuropathic pain, pulmonary emboli, urinary incontinence. On Eliquis (apixaban) daily. Code stroke was activated by EMS.   Patient from home where she was LKW at 0930 and now complaining of right sided weakness and aphasia. EMS reports that patient woke up normal this morning and then had acute onset of difficulty speaking and right sided weakness.   Stroke team at the bedside on patient arrival. Labs drawn and patient cleared for CT by Dr. Larinda Buttery. Patient to CT with team. NIHSS 11, see documentation for details and code stroke times. Patient with left, right arm weakness, bilateral leg weakness, right decreased sensation, Expressive aphasia , and dysarthria  on exam. The following imaging was completed:  CT Head and MRI. Patient is not a candidate for IV Thrombolytic due to unclear last dose of Eliquis, per MD. Patient is not a candidate for IR due to exam not consistent with LVO, per MD.   Care Plan: every 4 hour NIHSS, vital signs as ordered. Swallow screen per order   Bedside handoff with ED RN Vet.    Wille Glaser  Stroke Response RN

## 2023-03-20 NOTE — Progress Notes (Signed)
CODE STROKE- PHARMACY COMMUNICATION   Time CODE STROKE called/page received:1052  Time response to CODE STROKE was made (in person): 1056  Time Stroke Kit retrieved from Pyxis: not required  Name of Provider/Nurse contacted:Bhagat, MD & Tresa Endo, RN  Past Medical History:  Diagnosis Date   Anxiety    Cancer (HCC)    2006 right side breast ca. lumpectomy, skin ca on same breast    GERD (gastroesophageal reflux disease)    Hypercholesteremia    Peripheral neuropathic pain    Personal history of chemotherapy    2006/2014   Personal history of radiation therapy    2006/2014   Pulmonary emboli [facility])    Urinary incontinence    Prior to Admission medications   Medication Sig Start Date End Date Taking? Authorizing Provider  acetaminophen (TYLENOL) 325 MG tablet Take 325 mg by mouth every 4 (four) hours as needed.   Yes [provider]  apixaban (ELIQUIS) 5 MG TABS tablet Take 1 tablet (5 mg total) by mouth 2 (two) times daily. 11/26/21  Yes Lurene Shadow, MD  atorvastatin (LIPITOR) 40 MG tablet Take 1 tablet (40 mg total) by mouth daily. 02/07/22 03/20/23 Yes Dorcas Carrow, MD  cetirizine (ZYRTEC) 10 MG tablet Take 10 mg by mouth at bedtime.   Yes [provider]  Cholecalciferol 125 MCG (5000 UT) TABS Take 5,000 Units by mouth every morning.   Yes [provider]  Cyanocobalamin 500 MCG TBDP Take 500 mcg by mouth every morning.   Yes [provider]  fluticasone (FLONASE) 50 MCG/ACT nasal spray Place 2 sprays into both nostrils daily as needed for allergies or rhinitis. 07/06/19  Yes [provider]  gabapentin (NEURONTIN) 300 MG capsule Take 600 mg by mouth at bedtime.   Yes [provider]  levothyroxine (SYNTHROID, LEVOTHROID) 125 MCG tablet Take 125 mcg by mouth.  12/19/17 03/20/23 Yes [provider]  memantine (NAMENDA) 5 MG tablet Take 5 mg by mouth 2 (two) times daily. 11/20/22  Yes [provider]  omeprazole  (PRILOSEC) 20 MG capsule Take 20 mg by mouth daily.   Yes [provider]  temazepam (RESTORIL) 15 MG capsule Take 15 mg by mouth at bedtime. 11/28/21  Yes [provider]  traZODone (DESYREL) 50 MG tablet Take 50 mg by mouth at bedtime. 10/16/21  Yes [provider]    Lowella Bandy ,PharmD Clinical Pharmacist  03/20/2023  11:38 AM

## 2023-03-20 NOTE — Progress Notes (Signed)
Eeg done 

## 2023-03-20 NOTE — Procedures (Signed)
Patient Name: [name] [name]  MRN: [id number]  Epilepsy Attending: Charlsie Quest  Referring Physician/Provider: Gordy Councilman, MD  Date: 03/20/2023 Duration: 27.18 mins  Patient history: 71yo f with aphasia and right sided weakness. EEG to evaluate for seizure.  Level of alertness: Awake  AEDs during EEG study: None  Technical aspects: This EEG study was done with scalp electrodes positioned according to the 10-20 International system of electrode placement. Electrical activity was reviewed with band pass filter of 1-70Hz , sensitivity of 7 uV/mm, display speed of 30mm/sec with a 60Hz  notched filter applied as appropriate. EEG data were recorded continuously and digitally stored.  Video monitoring was available and reviewed as appropriate.  Description: The posterior dominant rhythm consists of 8-9 Hz activity of moderate voltage (25-35 uV) seen predominantly in posterior head regions, symmetric and reactive to eye opening and eye closing. Hyperventilation and photic stimulation were not performed.     IMPRESSION: This study is within normal limits. No seizures or epileptiform discharges were seen throughout the recording.  A normal interictal EEG does not exclude the diagnosis of epilepsy.  [name] [name]

## 2023-03-20 NOTE — Consult Note (Signed)
Neurology Consultation Reason for Consult: Aphasia and right sided weakness Requesting Physician: Chesley Noon   CC: Aphasia and right sided weakness   History is obtained from: Chart review and EMS, could not reach husband or son for collateral despite attempts  HPI: [name] [name] is a 72 y.o. 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  Of note she presented 02/06/2022 with very similar symptoms with negative stroke workup, inconsistent neurological examination and suddenly resolving symptoms that started during an argument with her husband and she was diagnosed with a left MCA stuttering TIA.   Per review of outpatient neurology notes her last MoCA (on her last visit 6 months ago, 09/20/2022) was 18/30 and there was significant concerns about her social situation (living with her husband with a diagnosis of dementia as well) with her outpatient neurologist expressing concerns about medication adherence, as well as concerns of poor insight into her memory loss and limitations. Long standing ongoing paranoia/delusions relating to strained relationships with her family were also noted.  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 Able to look after own affairs without assistance, but unable to carry out all previous activities.     3 - Moderate disability. Requires some help, but able to walk unassisted.  ROS: Unable to obtain due to altered mental status.   Past Medical History:  Diagnosis Date   Anxiety    Cancer (HCC)    2006 right side breast ca. lumpectomy, skin ca on same breast    GERD (gastroesophageal reflux disease)    Hypercholesteremia    Peripheral neuropathic pain    Personal history of chemotherapy    2006/2014   Personal history of radiation therapy    2006/2014   Pulmonary emboli [facility])    Urinary incontinence    Past Surgical History:  Procedure Laterality Date   APPENDECTOMY     CARDIAC CATHETERIZATION Left 05/23/2016   Procedure: Left Heart Cath and Coronary Angiography;  Surgeon: Marcina Millard, MD;  Location: [facility] INVASIVE CV LAB;  Service: Cardiovascular;  Laterality: Left;   IVC FILTER INSERTION N/A 08/19/2018   Procedure: IVC FILTER INSERTION;  Surgeon: Annice Needy, MD;  Location: [facility] INVASIVE CV LAB;  Service: Cardiovascular;  Laterality: N/A;   MASTECTOMY Right 2006/2014   rt breast removal     cancer   Current Outpatient Medications  Medication Instructions   acetaminophen (TYLENOL) 325 mg, Oral, Every 4 hours PRN   apixaban (ELIQUIS) 5 mg, Oral, 2 times daily   atorvastatin (LIPITOR) 40 mg, Oral, Daily   cetirizine (ZYRTEC) 10 mg, Oral, Daily at bedtime   Cholecalciferol 5,000 Units, Oral, Every morning   Cyanocobalamin 500 mcg, Oral, Every morning   fluticasone (FLONASE) 50 MCG/ACT nasal spray 2 sprays, Each Nare, Daily  PRN   gabapentin (NEURONTIN) 600 mg, Oral, Daily at bedtime   levothyroxine (SYNTHROID) 125 mcg, Oral   memantine (NAMENDA) 5 mg, Oral, 2 times daily   omeprazole (PRILOSEC) 20 mg, Oral, Daily   temazepam (RESTORIL) 15 mg, Oral, Daily at bedtime   traZODone (DESYREL) 50 mg, Oral, Daily at bedtime    Family History  Problem Relation Age of Onset   Asthma Mother    COPD Mother    CVA Father    Heart attack Father    Coronary artery disease Father    Schizophrenia Brother    Peripheral vascular disease Brother    Breast cancer Neg Hx      Social History:  reports that she has never smoked. She has never used smokeless tobacco. She reports that she does not drink alcohol and does not use drugs.   Exam: Current vital signs: There were no vitals taken for this visit. Vital signs in last 24 hours:     Physical Exam  Constitutional: Appears well-developed and well-nourished.  Psych: Affect anxious and tearful but cooperative, slow to respond Eyes: No scleral injection HENT: No oropharyngeal obstruction.  MSK: no joint deformities.  Cardiovascular: Perfusing extremities well Respiratory: Effort normal, non-labored breathing GI: Soft.  No distension. There is no tenderness.  GU: Briefs in place Skin: Warm dry and intact visible skin   Neuro: Mental Status: Patient is awake, alert, very slow speech output with mouthing words and drawing out the speech slowly, offering only the first syllable or 2 before stopping.  Correctly states her age by using her fingers to indicate 66.  No neglect.  Follows commands and answers yes/no questions correctly Cranial Nerves: II: Visual Fields are full to blink to threat. Pupils are equal and round III,IV, VI: EOMI without ptosis or diploplia.  V: Facial sensation is symmetric to eyelash brush VII: Facial movement is symmetric.  VIII: hearing is intact to voice Motor: Inconsistent use of the right upper extremity.  Flops quickly to the bed on formal testing but she is able to maintain it antigravity for an extended period of time when she is being changed for MRI.  Similarly uses both legs equally to assist with removal of her pants. Sensory: Reports reduced sensation on the right side Cerebellar: Finger-to-nose intact within limits of  effort.  Does not perform heel-to-shin Gait:  Deferred   NIHSS total 11 Score breakdown: 1 point for left arm drift, 2 points for right arm drift, 2 points for left leg drift, 2 points for right leg drift, 1 point for sensory loss on the right side, 2 points for aphasia versus functional speech disorder, 1 point for dysarthria  I have reviewed labs in epic and the results pertinent to this consultation are:  Basic Metabolic Panel: Recent Labs  Lab 03/20/23 1114 03/20/23 1116  NA 142  --   K 4.0  --   CL 108  --   CO2 23  --   GLUCOSE 123*  --   BUN 19  --   CREATININE 1.32* 1.40*  CALCIUM 8.9  --     CBC: Recent Labs  Lab 03/20/23 1114  WBC 5.5  NEUTROABS 4.2  HGB 13.4  HCT 41.5  MCV 85.2  PLT 184    Coagulation Studies: Recent Labs    03/20/23 1114  LABPROT 13.9  INR 1.1      I have reviewed the images obtained:  Head CT personally reviewed, no acute abnormality though there is significant  chronic microvascular disease that can reduce sensitivity of head CT  Emergent recommendations: - Given renal function and overall 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  However this is a diagnosis of exclusion and therefore an acute left MCA syndrome was excluded with MRI imaging.  I will also obtain EEG given patient's with dementia are at high risk of seizure  Recommendations: -EEG -Neurology will follow-up full imaging when completed as well as EEG -If these studies are negative, inpatient neurology will sign off and recommend consideration of psychiatric  evaluation; from a neurological perspective would continue outpatient follow-up  Brooke Dare MD-PhD [facility] [phone] [facility] coverage for [facility] is from 8 AM to 4 AM in-house and 4 PM to 8 PM by telephone/video. 8 PM to 8 AM emergent questions or overnight urgent questions should be addressed to Teleneurology On-call or [facility] neurohospitalist; contact information can be found on AMION    Total critical care time: 50 minutes   Critical care time was exclusive of separately billable procedures and treating other patients.   Critical care was necessary to treat or prevent imminent or life-threatening deterioration.   Critical care was time spent personally by me on the following activities: development of treatment plan with patient and/or surrogate as well as nursing, discussions with consultants/primary team, evaluation of patient's response to treatment, examination of patient, obtaining history from patient or surrogate, ordering and performing treatments and interventions, ordering and review of laboratory studies, ordering and review of radiographic studies, and re-evaluation of patient's condition as needed, as documented above.

## 2023-03-20 NOTE — ED Provider Notes (Addendum)
[facility] Provider Note    Event Date/Time   First MD Initiated Contact with Patient 03/20/23 1057     (approximate)   History   Chief Complaint Code Stroke  HPI  [name] [name] is a 72 y.o. female with past medical history of hyperlipidemia, PE on Eliquis, GERD, and breast cancer who presents to the ED for stroke.  Per EMS, patient was doing well this morning up until 930, when husband noticed some right-sided weakness along with patient having difficulty getting words out.  EMS was called and code stroke activated prior to arrival.     Physical Exam   Triage Vital Signs: ED Triage Vitals  Encounter Vitals Group     BP      Systolic BP Percentile      Diastolic BP Percentile      Pulse      Resp      Temp      Temp src      SpO2      Weight      Height      Head Circumference      Peak Flow      Pain Score      Pain Loc      Pain Education      Exclude from Growth Chart     Most recent vital signs: Vitals:   03/20/23 1055 03/20/23 1234  BP: (!) 170/110 (!) 181/83  Pulse: (!) 55 67  Resp: 17 18  Temp: 98.2 F (36.8 C)   SpO2: 100% 100%    Constitutional: Alert and oriented. Eyes: Conjunctivae are normal. Head: Atraumatic. Nose: No congestion/rhinnorhea. Mouth/Throat: Mucous membranes are moist.  Cardiovascular: Normal rate, regular rhythm. Grossly normal heart sounds.  2+ radial pulses bilaterally. Respiratory: Normal respiratory effort.  No retractions. Lungs CTAB. Gastrointestinal: Soft and nontender. No distention. Musculoskeletal: No lower extremity tenderness nor edema.  Neurologic: Expressive aphasia noted.  4+ out of 5 strength noted on right upper and right lower extremities, 5 out of 5 strength noted in left upper and lower extremities.    ED Results / Procedures / Treatments   Labs (all labs ordered are listed, but only abnormal results are displayed) Labs Reviewed  COMPREHENSIVE METABOLIC PANEL - Abnormal;  Notable for the following components:      Result Value   Glucose, Bld 123 (*)    Creatinine, Ser 1.32 (*)    GFR, Estimated 43 (*)    All other components within normal limits  I-STAT CREATININE, ED - Abnormal; Notable for the following components:   Creatinine, Ser 1.40 (*)    All other components within normal limits  CBG MONITORING, ED - Abnormal; Notable for the following components:   Glucose-Capillary 121 (*)    All other components within normal limits  CBC  PROTIME-INR  APTT  DIFFERENTIAL  ETHANOL  URINE DRUG SCREEN, QUALITATIVE ([facility] ONLY)  URINALYSIS, ROUTINE W REFLEX MICROSCOPIC   RADIOLOGY CT head reviewed and interpreted by me with no hemorrhage or midline shift.  PROCEDURES:  Critical Care performed: No  Procedures   MEDICATIONS ORDERED IN ED: Medications  gadobutrol (GADAVIST) 1 MMOL/ML injection 8 mL (8 mLs Intravenous Contrast Given 03/20/23 1203)     IMPRESSION / MDM / ASSESSMENT AND PLAN / ED COURSE  I reviewed the triage vital signs and the nursing notes.  72 y.o. female with past medical history of hyperlipidemia, PE on Eliquis, GERD, and breast cancer who presents to the ED complaining of acute onset right-sided weakness with difficulty getting words out since 930 this morning.  Patient's presentation is most consistent with acute presentation with potential threat to life or bodily function.  Differential diagnosis includes, but is not limited to, stroke, TIA, intracranial hemorrhage, anemia, electrolyte abnormality, AKI, complicated migraine.  Patient nontoxic-appearing and in no acute distress, vital signs are unremarkable.  She has some subtle right-sided weakness along with apparent expressive aphasia, code stroke activated prior to arrival and patient taken immediately to the CT scanner.  Imaging and lab results are pending at this time, patient being evaluated by Dr. Iver Nestle of neurology.  CT head is negative for  acute process, labs show stable CKD without acute electrolyte abnormality, anemia, or leukocytosis.  LFTs and coags are also unremarkable.  MRI negative for acute stroke, per neurology patient to undergo EEG to rule out seizure.  Neurology favors functional neurologic disorder, however patient continues to have significant right-sided weakness and is unable to walk.  She will require admission to the hospital for further workup.  Case discussed with hospitalist for admission.  Shortly after admission was discussed, I had another discussion with patient's family, including daughter-in-law who is a Engineer, civil (consulting).  They feel that patient likely had a panic attack contributing to her symptoms today and prefer that she be discharged home as they feel she will only become more anxious and agitated if admitted to the hospital.  Given unremarkable workup, including negative EEG and MRI, this seems reasonable.  I did inform family that I had concerns patient would be unable to walk at home, however they state that they feel comfortable caring for the patient.  They are requesting referral for psychiatric evaluation and I have provided contact information for [facility].  Family counseled to have patient return to the ED for new worsening symptoms, patient and family agree with plan.      FINAL CLINICAL IMPRESSION(S) / ED DIAGNOSES   Final diagnoses:  Right sided weakness  Aphasia     Rx / DC Orders   ED Discharge Orders     None        Note:  This document was prepared using Dragon voice recognition software and may include unintentional dictation errors.   Chesley Noon, MD 03/20/23 1252    Chesley Noon, MD 03/20/23 [phone]

## 2023-03-20 NOTE — ED Notes (Signed)
1117 Cleared by Dr Dorothyann Peng to end call.  Advises she will reactive if needed.  Pt is requiring MRI for additional imaging due to inability to gain IV access for CTA

## 2023-03-20 NOTE — ED Notes (Addendum)
1052 Cart activation.  Stroke coordinator with pt at time of activation 1053 Neuro paged 1054 Dr Dorothyann Peng at bedside and travels with pt to CT. Bhaget performing NIH

## 2023-08-21 ENCOUNTER — Encounter (INDEPENDENT_AMBULATORY_CARE_PROVIDER_SITE_OTHER): Payer: Self-pay

## 2023-12-10 IMAGING — CR DG CHEST 2V
2 series · 2 of 2 positions shown · non-contrast
Comparison: Chest x-ray 08/18/2018 and chest CT 08/22/2021

CLINICAL DATA: Fatigue, weakness and dizziness. Recent COVID
diagnosis.

EXAM:
CHEST - 2 VIEW

[chest lat]
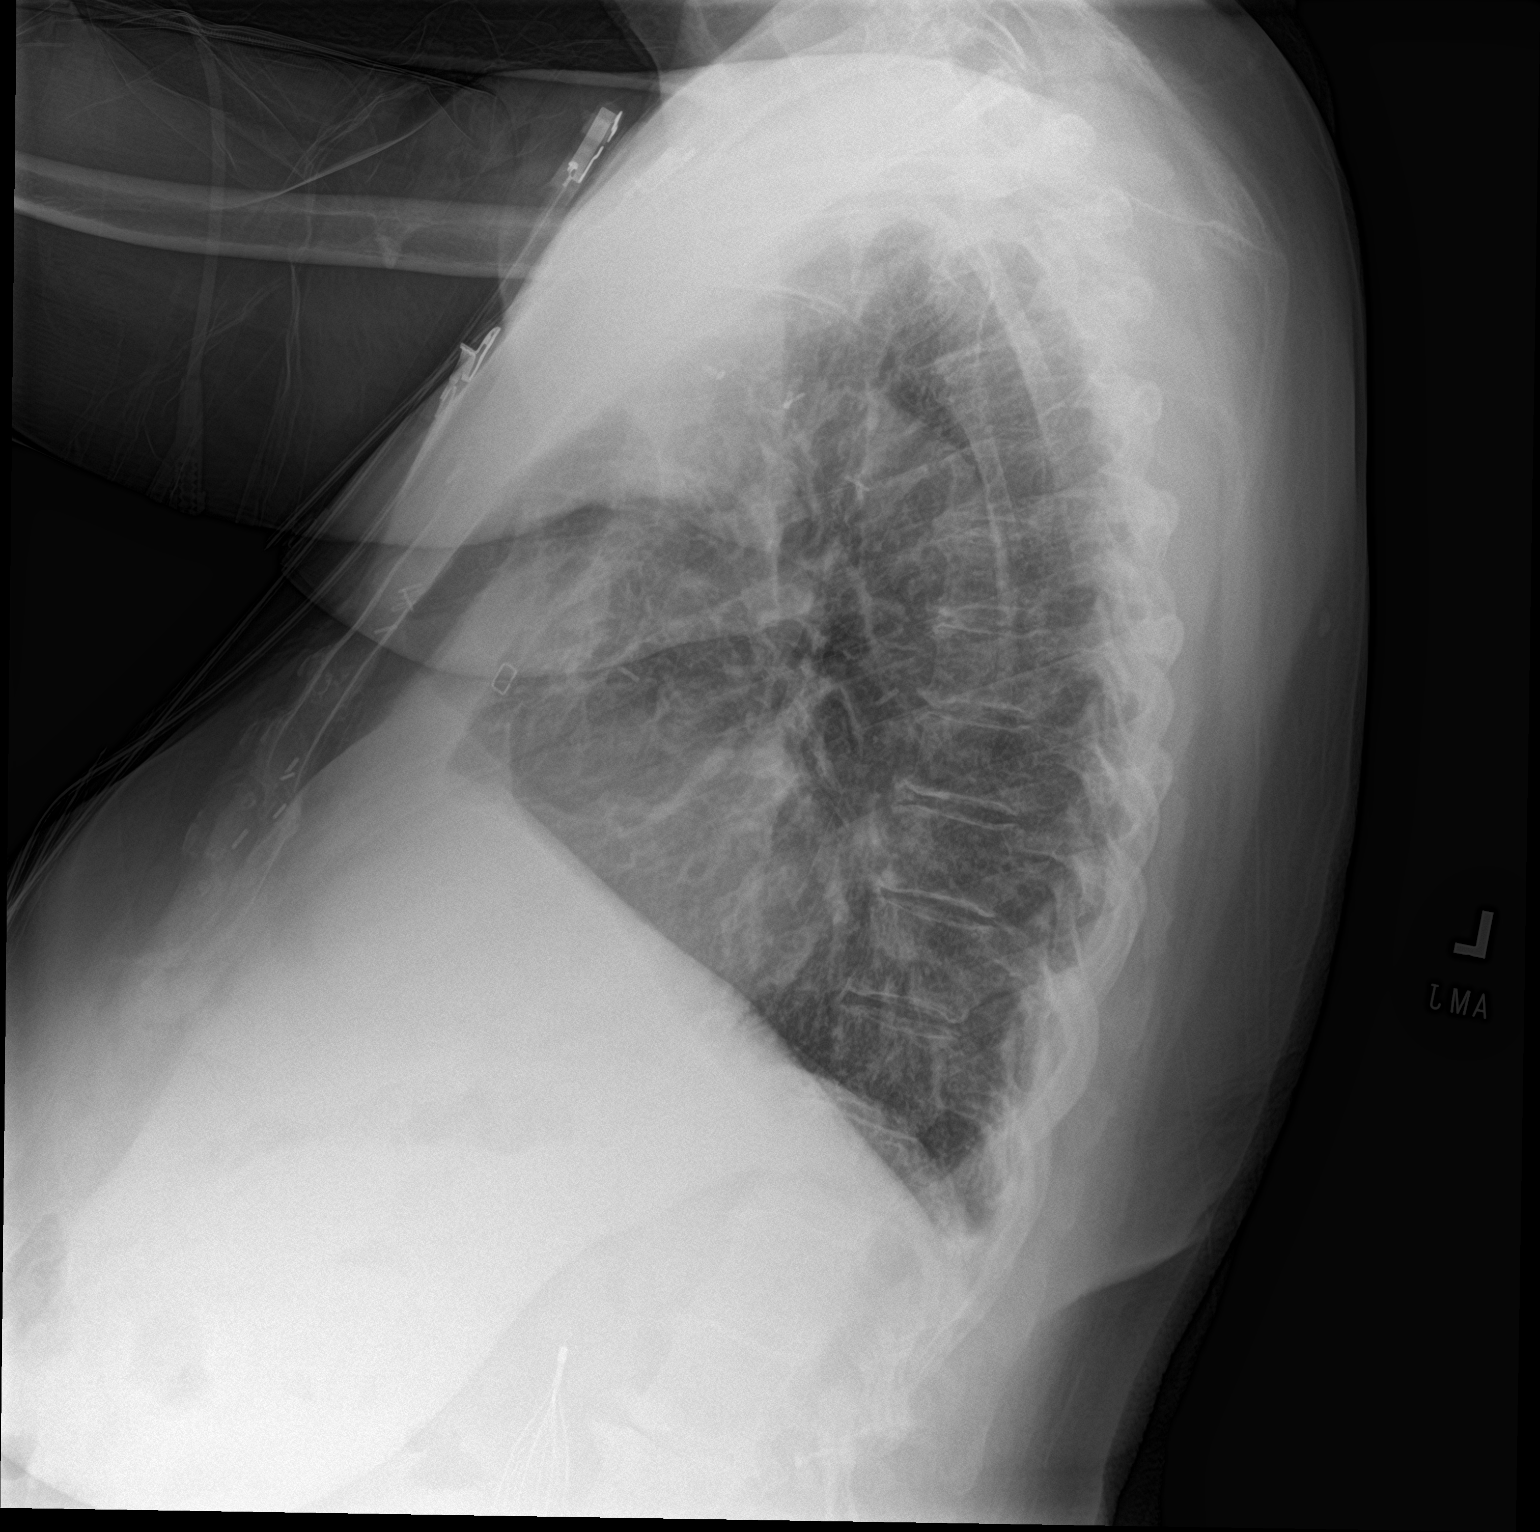

[chest ap]
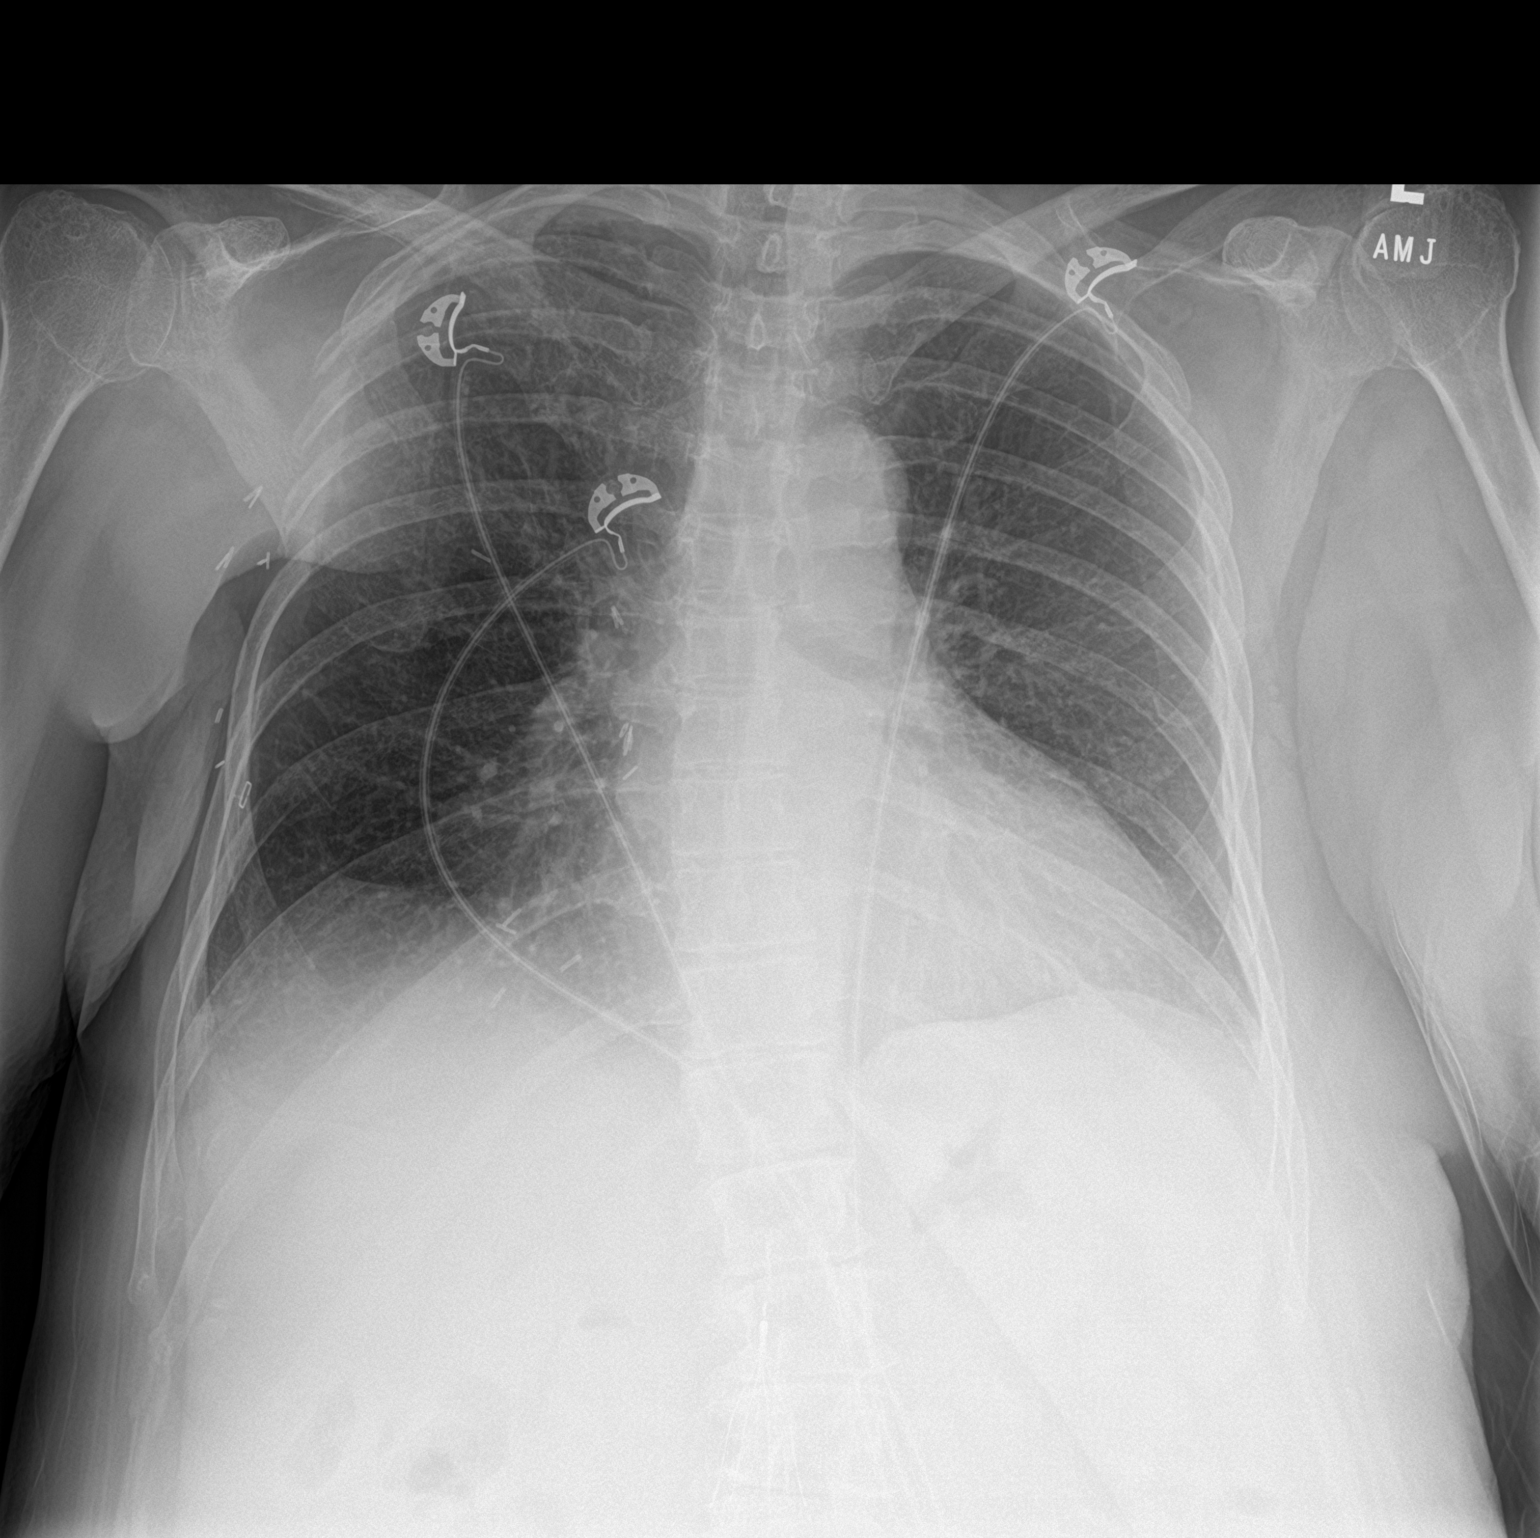

[2 of 2 positions shown; findings below may reference images not displayed]

FINDINGS: The cardiac silhouette, mediastinal and hilar contours are within
normal limits given the AP projection. Stable postoperative changes
involving the right upper hemithorax and status post right
mastectomy. The lungs are clear of an acute process. No pulmonary
lesions or pleural effusions. The bony thorax is stable.
IMPRESSION: No acute cardiopulmonary findings.

Stable postoperative changes involving the right hemithorax.

## 2023-12-10 IMAGING — CT CT HEAD W/O CM
4 series · 17 of 47 positions shown, 19 images · non-contrast
Comparison: None.

CLINICAL DATA: Mental status change



[Series 2: head bone · axial · 0.39mm/px · z∈[-132,-78]mm · 4 of 78 slices shown]
[im 8/78  bone]
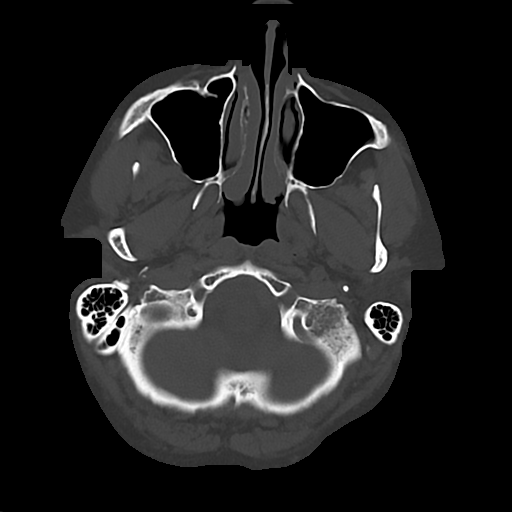
[im 16/78  bone]
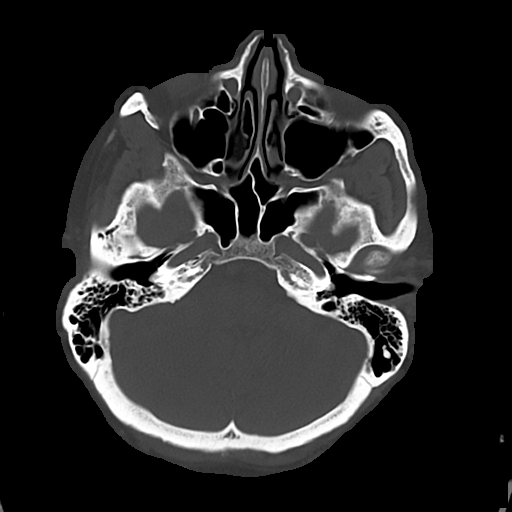
[im 24/78  bone]
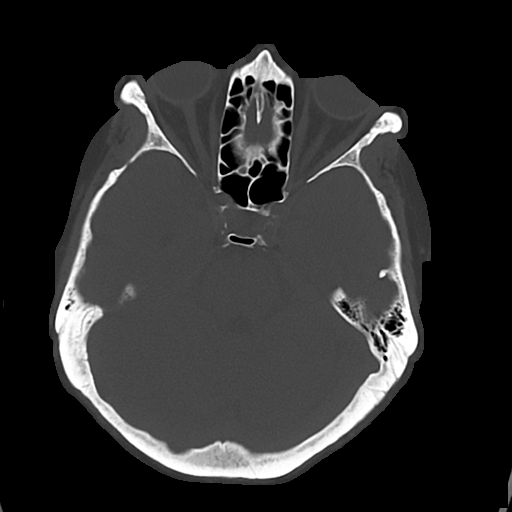
[im 35/78  bone]
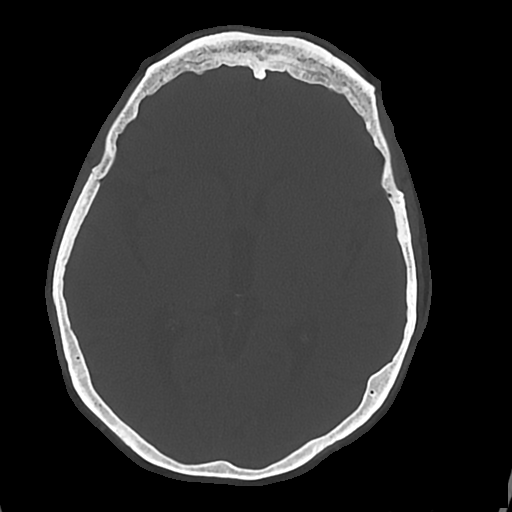

[Series 3: head wo · axial · 0.39mm/px · z∈[-132,-16]mm · 7 of 31 slices shown, 9 images]
[im 4/31  brain]
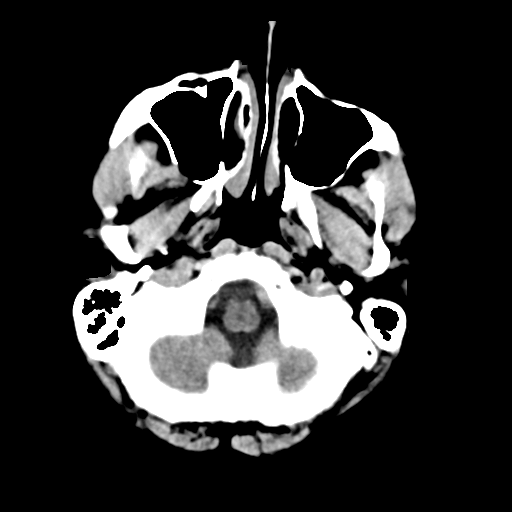
[im 4/31  bone]
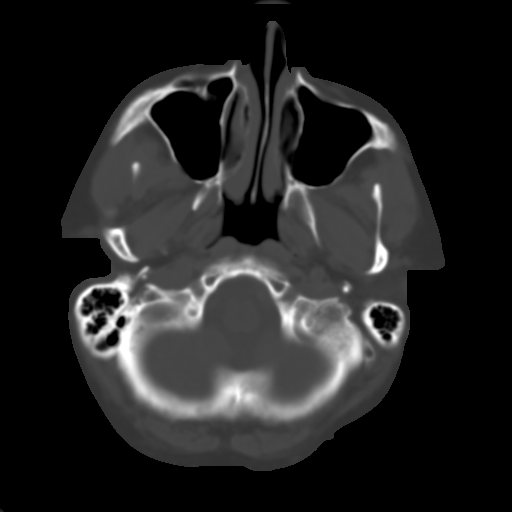
[im 8/31  brain]
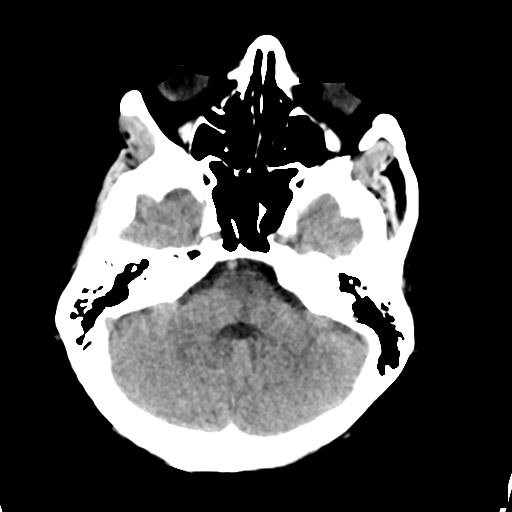
[im 12/31  brain]
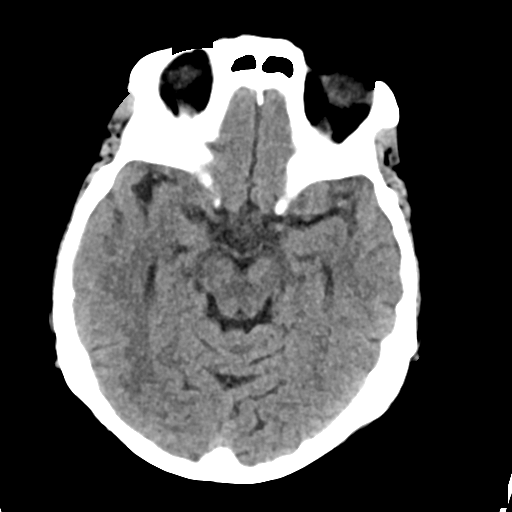
[im 16/31  brain]
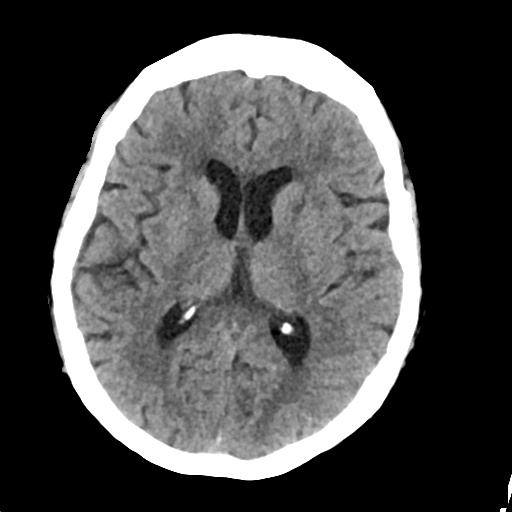
[im 19/31  brain]
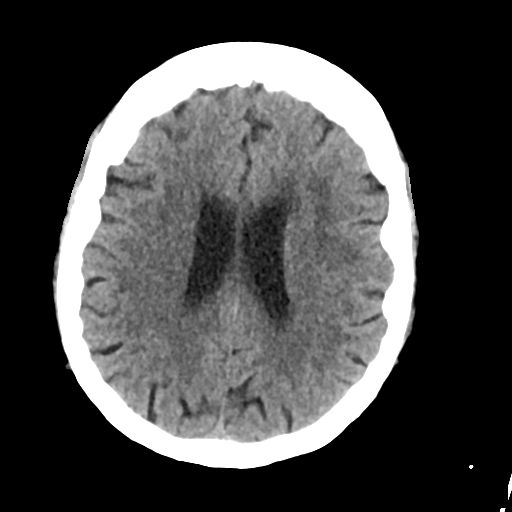
[im 19/31  bone]
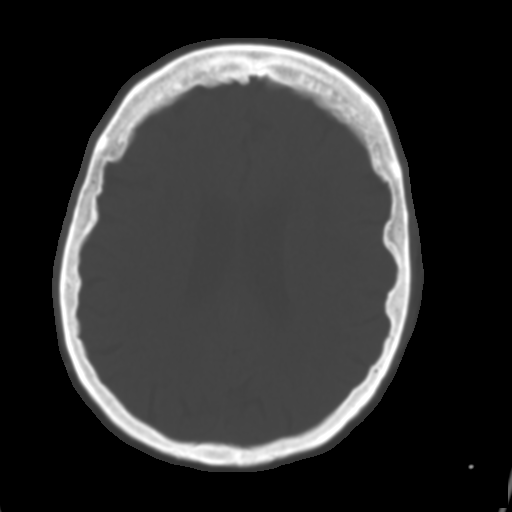
[im 23/31  brain]
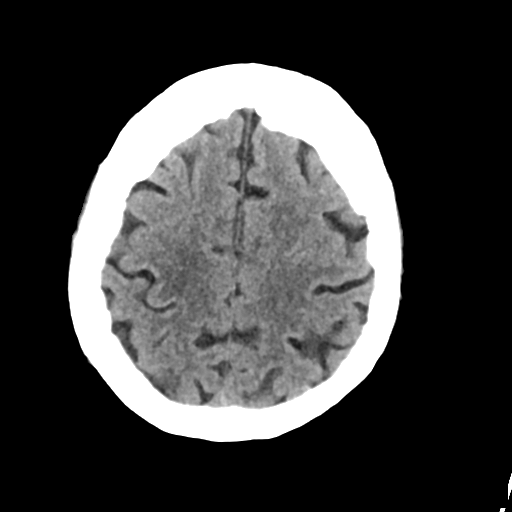
[im 27/31  brain]
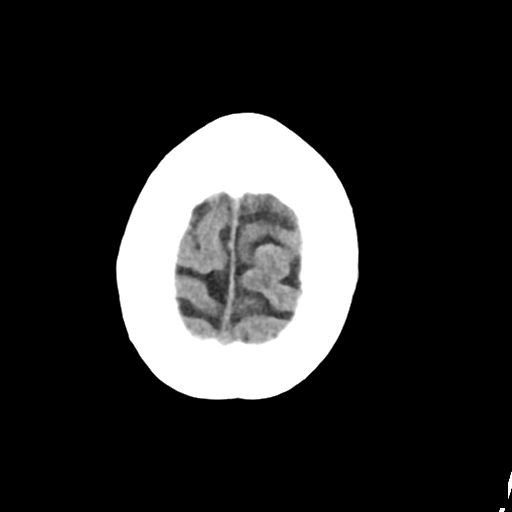

[Series 4: cor soft · coronal · 0.32mm/px · 3 of 65 slices shown]
[im 22/65  brain]
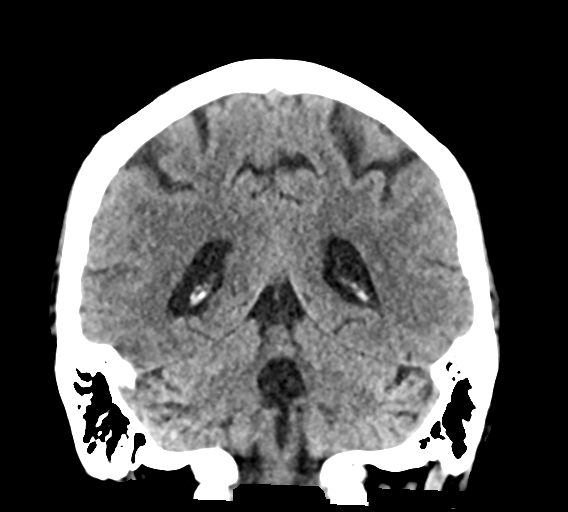
[im 29/65  brain]
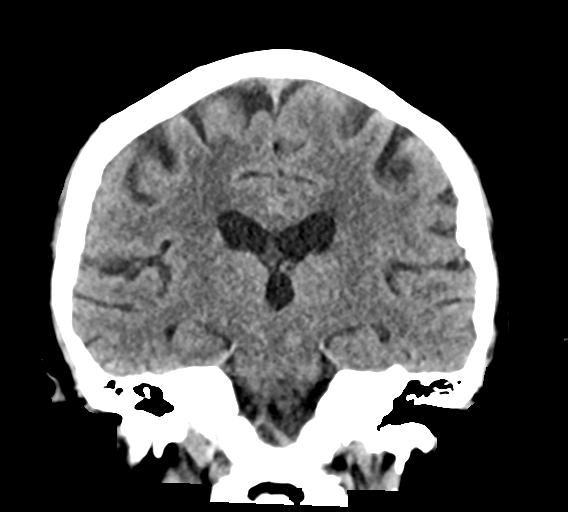
[im 36/65  brain]
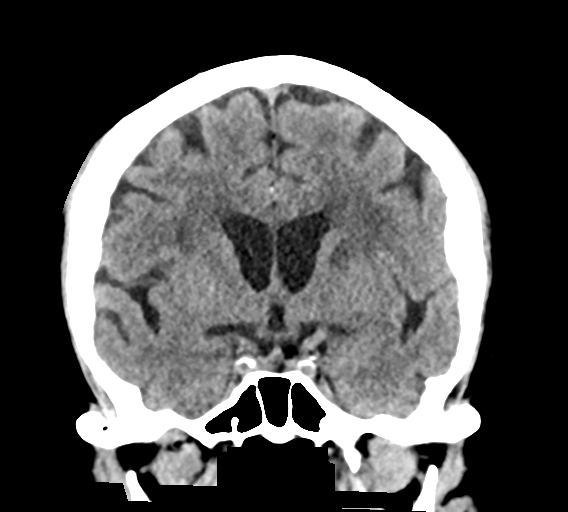

[Series 5: sag soft · sagittal · 0.34mm/px · 3 of 62 slices shown]
[im 21/62  brain]
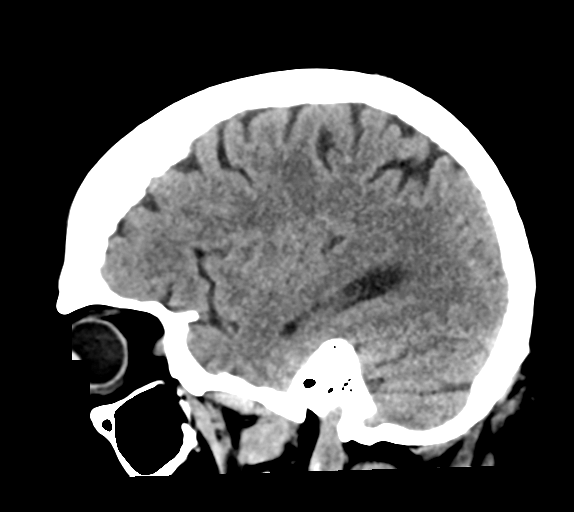
[im 31/62  brain]
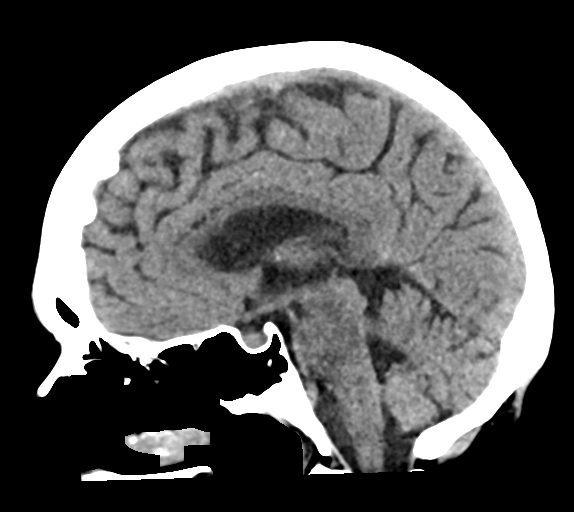
[im 41/62  brain]
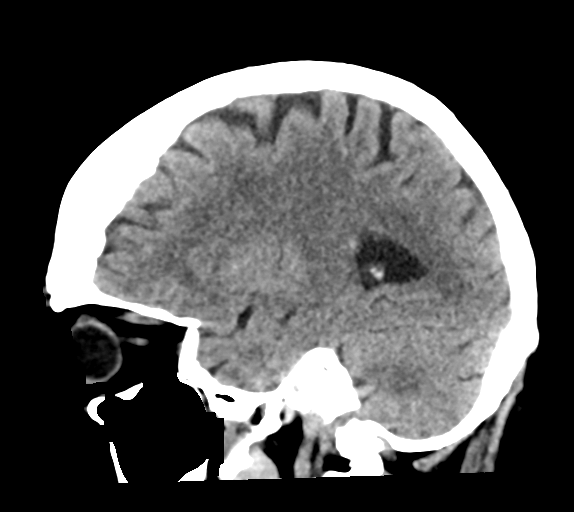

[17 of 47 positions shown; findings below may reference images not displayed]

FINDINGS: Brain: No acute intracranial hemorrhage, mass effect, or herniation.
No extra-axial fluid collections. No evidence of acute territorial
infarct. No hydrocephalus. Patchy hypodensities in the
periventricular and subcortical white matter, likely secondary to
chronic microvascular ischemic changes.

Vascular: No hyperdense vessel or unexpected calcification.

Skull: Normal. Negative for fracture or focal lesion.

Sinuses/Orbits: No acute finding.

Other: None.
IMPRESSION: Chronic changes with no acute intracranial process identified.

## 2023-12-10 IMAGING — CT CT ABD-PELV W/O CM
2 of 4 series · 16 of 46 positions shown, 18 images · non-contrast
Comparison: CT 08/22/2021, CT 08/18/2018.

CLINICAL DATA: Kidney failure, acute renal failure



[Series 2: ap without · axial · non-contrast · 0.82mm/px · z∈[-862,-406]mm · 13 of 103 slices shown, 15 images]
[im 6/103  soft-tissue]
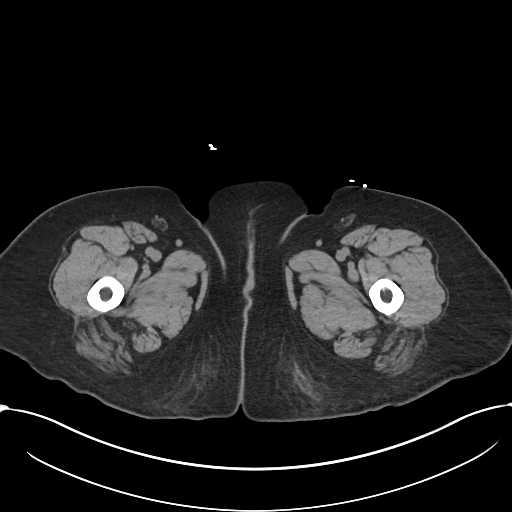
[im 6/103  bone]
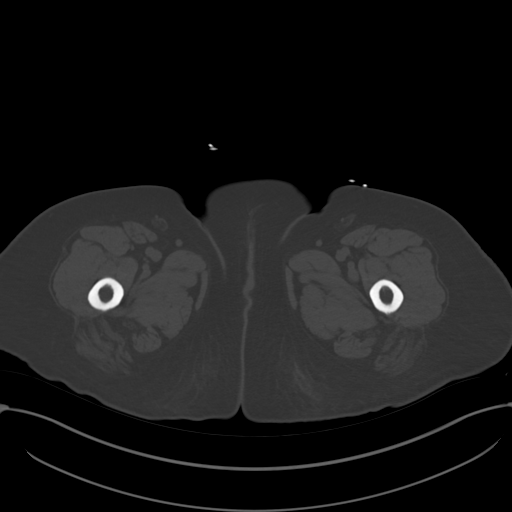
[im 17/103  soft-tissue]
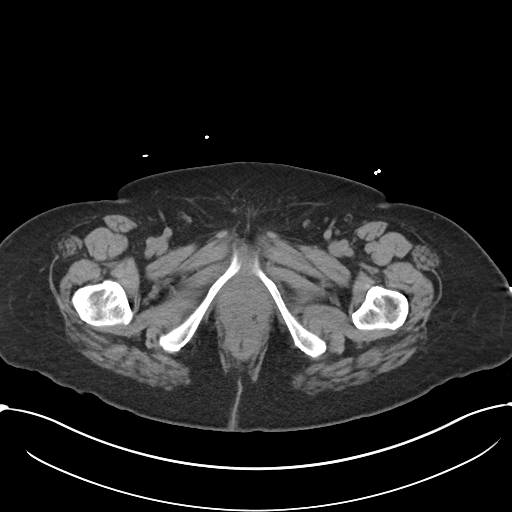
[im 22/103  soft-tissue]
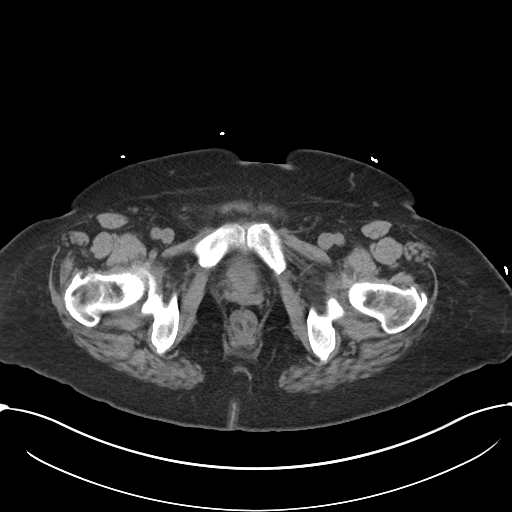
[im 27/103  soft-tissue]
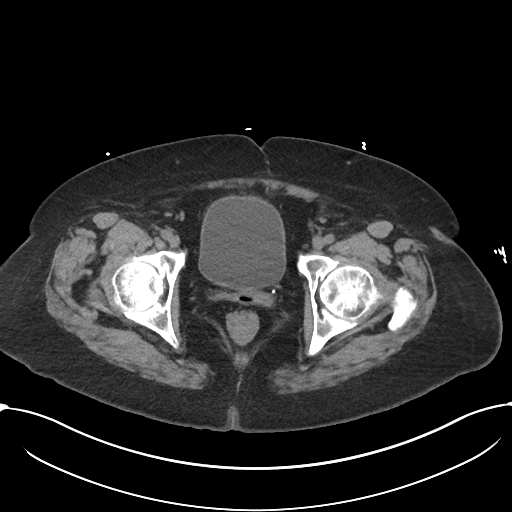
[im 38/103  soft-tissue]
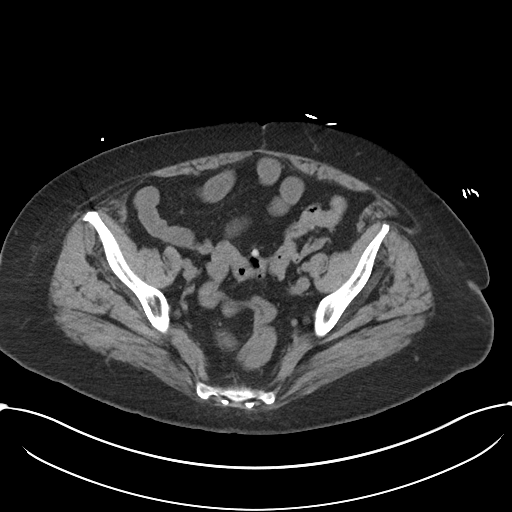
[im 43/103  soft-tissue]
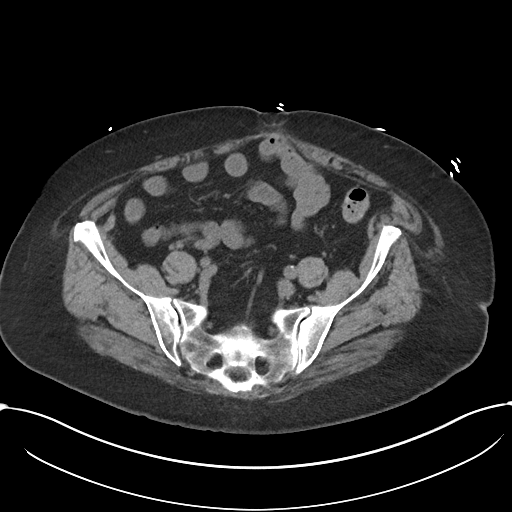
[im 54/103  soft-tissue]
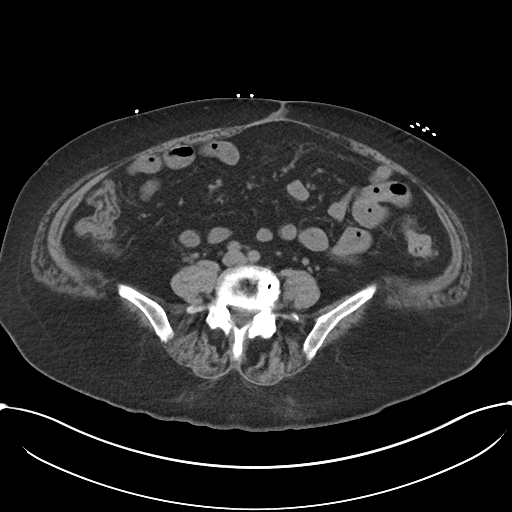
[im 60/103  soft-tissue]
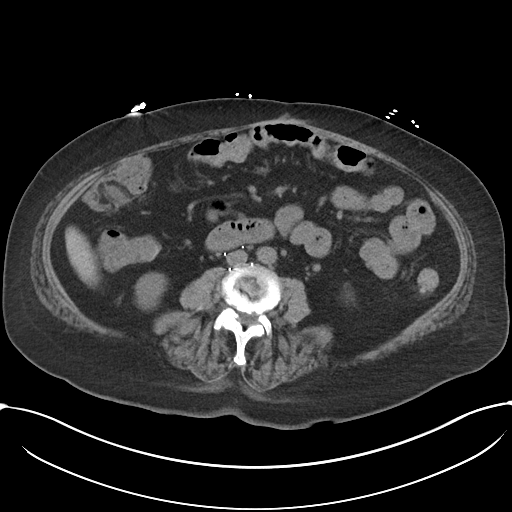
[im 65/103  soft-tissue]
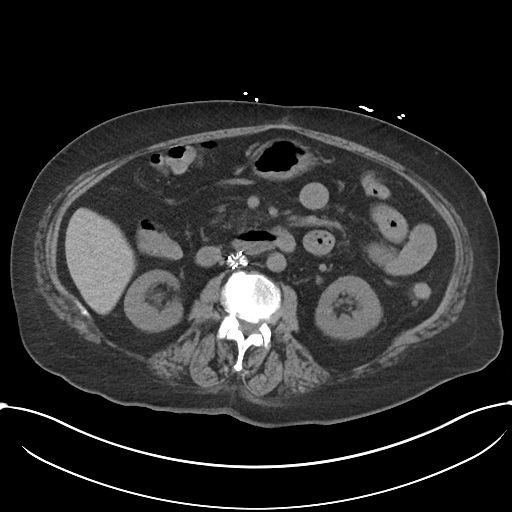
[im 65/103  bone]
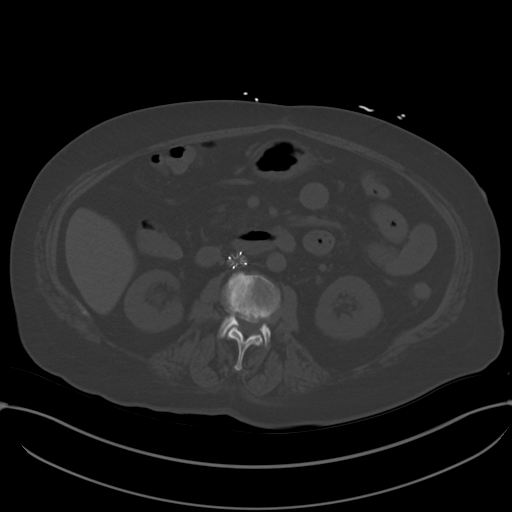
[im 76/103  soft-tissue]
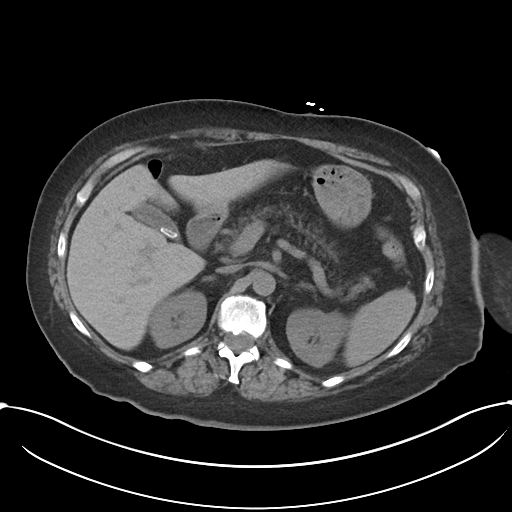
[im 81/103  soft-tissue]
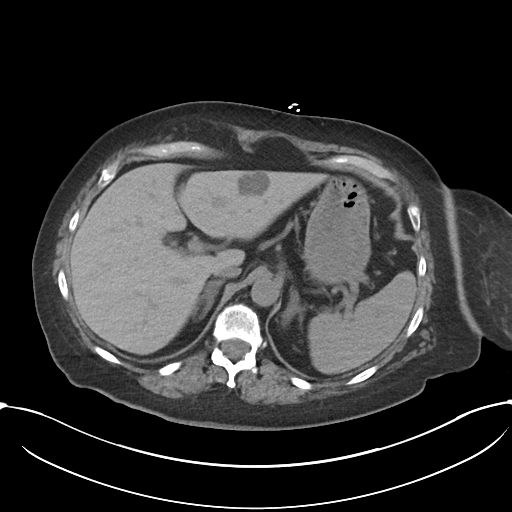
[im 86/103  soft-tissue]
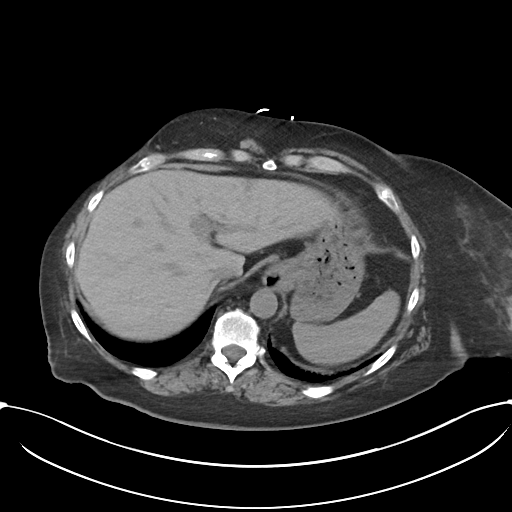
[im 97/103  soft-tissue]
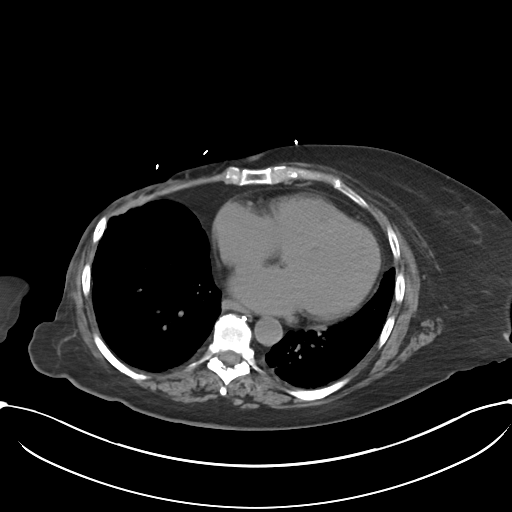

[Series 4: cor · coronal · 0.85mm/px · 3 of 95 slices shown]
[im 32/95  soft-tissue]
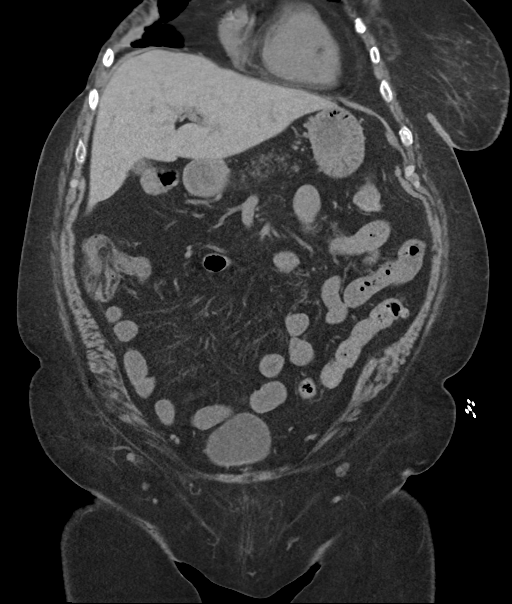
[im 42/95  soft-tissue]
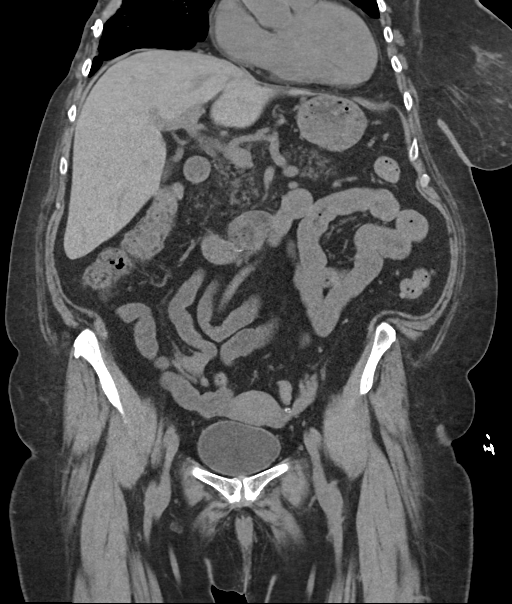
[im 53/95  soft-tissue]
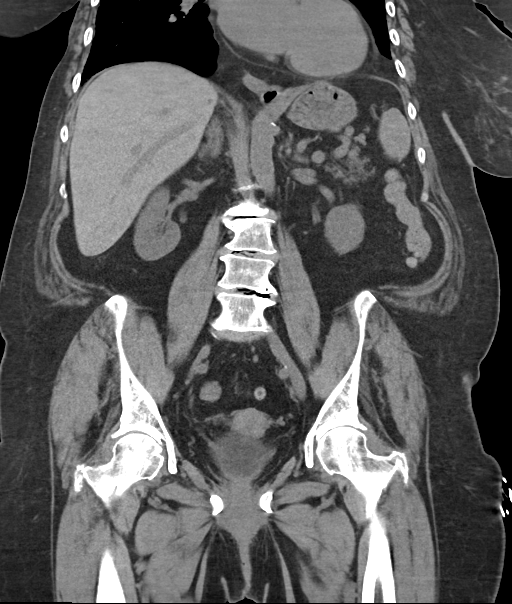

[16 of 46 positions shown; findings below may reference images not displayed]

FINDINGS: Lower chest: Prior right mastectomy. Similar scarring in the right
middle lobe. Unchanged small nodules in the periphery of the right
lower lobe largest measuring 4 mm dating back to July 2018
(series 6, image 3). These are benign. No acute findings seen in the
lower chest.

Hepatobiliary: Unchanged left hepatic cyst. No other parenchymal
abnormality. Cholelithiasis. No cholecystitis.

Pancreas: Fatty atrophy.  No ductal dilation.

Spleen: Normal in size without focal abnormality.

Adrenals/Urinary Tract: Unchanged 1.4 cm left adrenal myelolipoma.
No hydronephrosis or nephrolithiasis. Moderate bladder distension.

Stomach/Bowel: Small hiatal hernia. The stomach is otherwise
unremarkable. There is no evidence of bowel obstruction.Prior
appendectomy. There is no acute inflammatory process involving the
bowel. Colonic diverticulosis. No diverticulitis.

Vascular/Lymphatic: There is an IVC filter in place with multiple
legs protruding through the IVC lumen. Minimal aortoiliac
atherosclerotic calcifications. No AAA. No lymphadenopathy.

Reproductive: Unremarkable.

Other: Diastasis recti and small umbilical hernia containing a
nonobstructed loop of small bowel. No ascites. No free air.

Musculoskeletal: No acute osseous abnormality. No suspicious lytic
or blastic lesions. Multilevel degenerative changes of the spine.
Moderate to severe bilateral hip osteoarthritis.
IMPRESSION: No acute findings in the abdomen or pelvis. No hydronephrosis or
nephroureterolithiasis.

Small umbilical hernia containing a nonobstructed loop of small
bowel.

Cholelithiasis.  Colonic diverticulosis.

## 2023-12-16 ENCOUNTER — Encounter (INDEPENDENT_AMBULATORY_CARE_PROVIDER_SITE_OTHER): Payer: Self-pay

## 2024-02-23 ENCOUNTER — Emergency Department

## 2024-02-23 ENCOUNTER — Other Ambulatory Visit: Payer: Self-pay

## 2024-02-23 ENCOUNTER — Encounter: Payer: Self-pay | Admitting: Emergency Medicine

## 2024-02-23 ENCOUNTER — Inpatient Hospital Stay
Admission: EM | Admit: 2024-02-23 | Discharge: 2024-02-27 | DRG: 083 | Disposition: A | Discharge status: Skilled Nursing Facility | Attending: Hospitalist | Admitting: Hospitalist

## 2024-02-23 DIAGNOSIS — Y92009 Unspecified place in unspecified non-institutional (private) residence as the place of occurrence of the external cause: Secondary | ICD-10-CM | POA: Diagnosis not present

## 2024-02-23 DIAGNOSIS — E785 Hyperlipidemia, unspecified: Secondary | ICD-10-CM | POA: Diagnosis present

## 2024-02-23 DIAGNOSIS — I2602 Saddle embolus of pulmonary artery with acute cor pulmonale: Secondary | ICD-10-CM

## 2024-02-23 DIAGNOSIS — Z853 Personal history of malignant neoplasm of breast: Secondary | ICD-10-CM

## 2024-02-23 DIAGNOSIS — E876 Hypokalemia: Secondary | ICD-10-CM | POA: Diagnosis not present

## 2024-02-23 DIAGNOSIS — R404 Transient alteration of awareness: Secondary | ICD-10-CM | POA: Diagnosis not present

## 2024-02-23 DIAGNOSIS — R55 Syncope and collapse: Principal | ICD-10-CM | POA: Diagnosis present

## 2024-02-23 DIAGNOSIS — G309 Alzheimer's disease, unspecified: Secondary | ICD-10-CM | POA: Diagnosis present

## 2024-02-23 DIAGNOSIS — Z95828 Presence of other vascular implants and grafts: Secondary | ICD-10-CM

## 2024-02-23 DIAGNOSIS — S0012XA Contusion of left eyelid and periocular area, initial encounter: Secondary | ICD-10-CM

## 2024-02-23 DIAGNOSIS — Z9221 Personal history of antineoplastic chemotherapy: Secondary | ICD-10-CM

## 2024-02-23 DIAGNOSIS — Z823 Family history of stroke: Secondary | ICD-10-CM

## 2024-02-23 DIAGNOSIS — I639 Cerebral infarction, unspecified: Secondary | ICD-10-CM | POA: Diagnosis present

## 2024-02-23 DIAGNOSIS — E871 Hypo-osmolality and hyponatremia: Secondary | ICD-10-CM | POA: Diagnosis not present

## 2024-02-23 DIAGNOSIS — Z8249 Family history of ischemic heart disease and other diseases of the circulatory system: Secondary | ICD-10-CM

## 2024-02-23 DIAGNOSIS — Z7901 Long term (current) use of anticoagulants: Secondary | ICD-10-CM

## 2024-02-23 DIAGNOSIS — I2699 Other pulmonary embolism without acute cor pulmonale: Secondary | ICD-10-CM | POA: Diagnosis present

## 2024-02-23 DIAGNOSIS — S062XAA Diffuse traumatic brain injury with loss of consciousness status unknown, initial encounter: Secondary | ICD-10-CM | POA: Diagnosis not present

## 2024-02-23 DIAGNOSIS — N1832 Chronic kidney disease, stage 3b: Secondary | ICD-10-CM | POA: Diagnosis present

## 2024-02-23 DIAGNOSIS — R4701 Aphasia: Secondary | ICD-10-CM | POA: Diagnosis present

## 2024-02-23 DIAGNOSIS — Z923 Personal history of irradiation: Secondary | ICD-10-CM

## 2024-02-23 DIAGNOSIS — F0280 Dementia in other diseases classified elsewhere without behavioral disturbance: Secondary | ICD-10-CM | POA: Diagnosis present

## 2024-02-23 DIAGNOSIS — Z9049 Acquired absence of other specified parts of digestive tract: Secondary | ICD-10-CM

## 2024-02-23 DIAGNOSIS — Z6827 Body mass index (BMI) 27.0-27.9, adult: Secondary | ICD-10-CM

## 2024-02-23 DIAGNOSIS — Z79899 Other long term (current) drug therapy: Secondary | ICD-10-CM

## 2024-02-23 DIAGNOSIS — M6282 Rhabdomyolysis: Secondary | ICD-10-CM | POA: Diagnosis not present

## 2024-02-23 DIAGNOSIS — E663 Overweight: Secondary | ICD-10-CM | POA: Diagnosis present

## 2024-02-23 DIAGNOSIS — E872 Acidosis, unspecified: Secondary | ICD-10-CM | POA: Diagnosis present

## 2024-02-23 DIAGNOSIS — G9340 Encephalopathy, unspecified: Secondary | ICD-10-CM | POA: Insufficient documentation

## 2024-02-23 DIAGNOSIS — Z9011 Acquired absence of right breast and nipple: Secondary | ICD-10-CM

## 2024-02-23 DIAGNOSIS — Z79890 Hormone replacement therapy (postmenopausal): Secondary | ICD-10-CM

## 2024-02-23 DIAGNOSIS — Z86711 Personal history of pulmonary embolism: Secondary | ICD-10-CM

## 2024-02-23 DIAGNOSIS — I82409 Acute embolism and thrombosis of unspecified deep veins of unspecified lower extremity: Secondary | ICD-10-CM | POA: Diagnosis present

## 2024-02-23 DIAGNOSIS — W1830XA Fall on same level, unspecified, initial encounter: Secondary | ICD-10-CM | POA: Diagnosis present

## 2024-02-23 DIAGNOSIS — W19XXXA Unspecified fall, initial encounter: Secondary | ICD-10-CM

## 2024-02-23 DIAGNOSIS — E7800 Pure hypercholesterolemia, unspecified: Secondary | ICD-10-CM | POA: Diagnosis present

## 2024-02-23 DIAGNOSIS — Z8673 Personal history of transient ischemic attack (TIA), and cerebral infarction without residual deficits: Secondary | ICD-10-CM

## 2024-02-23 DIAGNOSIS — I959 Hypotension, unspecified: Secondary | ICD-10-CM | POA: Diagnosis not present

## 2024-02-23 DIAGNOSIS — G9349 Other encephalopathy: Secondary | ICD-10-CM | POA: Diagnosis present

## 2024-02-23 DIAGNOSIS — R29713 NIHSS score 13: Secondary | ICD-10-CM | POA: Diagnosis present

## 2024-02-23 DIAGNOSIS — E039 Hypothyroidism, unspecified: Secondary | ICD-10-CM | POA: Diagnosis present

## 2024-02-23 LAB — URINALYSIS, W/ REFLEX TO CULTURE (INFECTION SUSPECTED)
Bacteria, UA: NONE SEEN
Bilirubin Urine: NEGATIVE
Glucose, UA: NEGATIVE mg/dL
Ketones, ur: 5 mg/dL — AB
Leukocytes,Ua: NEGATIVE
Nitrite: NEGATIVE
Protein, ur: NEGATIVE mg/dL
RBC / HPF: 0 RBC/hpf (ref 0–5)
Specific Gravity, Urine: 1.003 — ABNORMAL LOW (ref 1.005–1.030)
Squamous Epithelial / HPF: 0 /HPF (ref 0–5)
pH: 6 (ref 5.0–8.0)

## 2024-02-23 LAB — BASIC METABOLIC PANEL WITH GFR
Anion gap: 15 (ref 5–15)
BUN: 21 mg/dL (ref 8–23)
CO2: 18 mmol/L — ABNORMAL LOW (ref 22–32)
Calcium: 8.7 mg/dL — ABNORMAL LOW (ref 8.9–10.3)
Chloride: 94 mmol/L — ABNORMAL LOW (ref 98–111)
Creatinine, Ser: 1.48 mg/dL — ABNORMAL HIGH (ref 0.44–1.00)
GFR, Estimated: 37 mL/min — ABNORMAL LOW (ref >60)
Glucose, Bld: 104 mg/dL — ABNORMAL HIGH (ref 70–99)
Potassium: 3.5 mmol/L (ref 3.5–5.1)
Sodium: 127 mmol/L — ABNORMAL LOW (ref 135–145)

## 2024-02-23 LAB — CBC WITH DIFFERENTIAL/PLATELET
Abs Immature Granulocytes: 0.03 K/uL (ref 0.00–0.07)
Basophils Absolute: 0 K/uL (ref 0.0–0.1)
Basophils Relative: 0 %
Eosinophils Absolute: 0 K/uL (ref 0.0–0.5)
Eosinophils Relative: 0 %
HCT: 40.9 % (ref 36.0–46.0)
Hemoglobin: 13.5 g/dL (ref 12.0–15.0)
Immature Granulocytes: 0 %
Lymphocytes Relative: 6 %
Lymphs Abs: 0.6 K/uL — ABNORMAL LOW (ref 0.7–4.0)
MCH: 26.9 pg (ref 26.0–34.0)
MCHC: 33 g/dL (ref 30.0–36.0)
MCV: 81.6 fL (ref 80.0–100.0)
Monocytes Absolute: 0.7 K/uL (ref 0.1–1.0)
Monocytes Relative: 7 %
Neutro Abs: 8.8 K/uL — ABNORMAL HIGH (ref 1.7–7.7)
Neutrophils Relative %: 87 %
Platelets: 137 K/uL — ABNORMAL LOW (ref 150–400)
RBC: 5.01 MIL/uL (ref 3.87–5.11)
RDW: 14.9 % (ref 11.5–15.5)
WBC: 10.1 K/uL (ref 4.0–10.5)
nRBC: 0 % (ref 0.0–0.2)

## 2024-02-23 LAB — OSMOLALITY, URINE: Osmolality, Ur: 129 mosm/kg — ABNORMAL LOW (ref 300–900)

## 2024-02-23 LAB — CK: Total CK: 1728 U/L — ABNORMAL HIGH (ref 38–234)

## 2024-02-23 LAB — SODIUM, URINE, RANDOM: Sodium, Ur: 15 mmol/L

## 2024-02-23 MED ORDER — SODIUM CHLORIDE 1 G PO TABS
1.0000 g | ORAL_TABLET | Freq: Two times a day (BID) | ORAL | Status: DC
  Administered 2024-02-24 – 2024-02-27 (×7): 1 g via ORAL
  Filled 2024-02-23 (×8): qty 1

## 2024-02-23 MED ORDER — GABAPENTIN 300 MG PO CAPS
600.0000 mg | ORAL_CAPSULE | Freq: Every day | ORAL | Status: DC
  Administered 2024-02-24: 600 mg via ORAL
  Filled 2024-02-23: qty 2

## 2024-02-23 MED ORDER — LEVOTHYROXINE SODIUM 100 MCG PO TABS
125.0000 ug | ORAL_TABLET | Freq: Every day | ORAL | Status: DC
  Administered 2024-02-25 – 2024-02-27 (×3): 125 ug via ORAL
  Filled 2024-02-23 (×4): qty 1

## 2024-02-23 MED ORDER — VITAMIN B-12 1000 MCG PO TABS
500.0000 ug | ORAL_TABLET | Freq: Every morning | ORAL | Status: DC
  Administered 2024-02-25 – 2024-02-27 (×3): 500 ug via ORAL
  Filled 2024-02-23 (×3): qty 1

## 2024-02-23 MED ORDER — TEMAZEPAM 15 MG PO CAPS
15.0000 mg | ORAL_CAPSULE | Freq: Every day | ORAL | Status: DC
  Administered 2024-02-24: 15 mg via ORAL
  Filled 2024-02-23: qty 2

## 2024-02-23 MED ORDER — SODIUM CHLORIDE 0.9 % IV BOLUS
1000.0000 mL | Freq: Once | INTRAVENOUS | Status: AC
  Administered 2024-02-23: 1000 mL via INTRAVENOUS

## 2024-02-23 MED ORDER — ATORVASTATIN CALCIUM 20 MG PO TABS: 40.0000 mg | ORAL_TABLET | Freq: Every day | ORAL | Status: DC

## 2024-02-23 MED ORDER — DIPHENHYDRAMINE HCL 50 MG/ML IJ SOLN: 12.5000 mg | Freq: Three times a day (TID) | INTRAMUSCULAR | Status: DC | PRN

## 2024-02-23 MED ORDER — VITAMIN D 25 MCG (1000 UNIT) PO TABS
5000.0000 [IU] | ORAL_TABLET | Freq: Every morning | ORAL | Status: DC
  Administered 2024-02-25 – 2024-02-27 (×3): 5000 [IU] via ORAL
  Filled 2024-02-23 (×3): qty 5

## 2024-02-23 MED ORDER — OXYCODONE-ACETAMINOPHEN 5-325 MG PO TABS: 1.0000 | ORAL_TABLET | ORAL | Status: DC | PRN

## 2024-02-23 MED ORDER — PANTOPRAZOLE SODIUM 40 MG PO TBEC
40.0000 mg | DELAYED_RELEASE_TABLET | Freq: Every day | ORAL | Status: DC
  Administered 2024-02-25 – 2024-02-27 (×3): 40 mg via ORAL
  Filled 2024-02-23 (×3): qty 1

## 2024-02-23 MED ORDER — MEMANTINE HCL 5 MG PO TABS
5.0000 mg | ORAL_TABLET | Freq: Two times a day (BID) | ORAL | Status: DC
  Administered 2024-02-24 – 2024-02-27 (×7): 5 mg via ORAL
  Filled 2024-02-23 (×9): qty 1

## 2024-02-23 MED ORDER — TRAZODONE HCL 50 MG PO TABS
50.0000 mg | ORAL_TABLET | Freq: Every day | ORAL | Status: DC
  Administered 2024-02-24: 50 mg via ORAL
  Filled 2024-02-23: qty 1

## 2024-02-23 MED ORDER — SODIUM CHLORIDE 0.9 % IV SOLN: INTRAVENOUS | Status: DC

## 2024-02-23 MED ORDER — APIXABAN 5 MG PO TABS
5.0000 mg | ORAL_TABLET | Freq: Two times a day (BID) | ORAL | Status: DC
  Administered 2024-02-24 – 2024-02-27 (×7): 5 mg via ORAL
  Filled 2024-02-23 (×6): qty 1

## 2024-02-23 MED ORDER — ACETAMINOPHEN 325 MG PO TABS
650.0000 mg | ORAL_TABLET | Freq: Four times a day (QID) | ORAL | Status: DC | PRN
  Administered 2024-02-26: 650 mg via ORAL
  Filled 2024-02-23: qty 2

## 2024-02-23 NOTE — ED Triage Notes (Signed)
 First nurse note: Pt here via [facility] from home with c/o of falling and doesn't remember falling.   155/80 HR: 90 97% RA   CBG 125

## 2024-02-23 NOTE — H&P (Signed)
 History and Physical    [name] [name] FMW:[id number] DOB: 05-15-1951 DOA: 02/23/2024  Referring MD/NP/PA:   PCP: Cleotilde Oneil FALCON, MD   Patient coming from:  The patient is coming from home.     Chief Complaint: fall, syncope  HPI: [name] [name] is a 73 y.o. female with medical history significant of stroke, hypothyroidism, GERD, dementia, CKD-3B, PE/DVT on Eliquis , neuropathy, urinary incontinence, breast cancer (s/p of right mastectomy, chemo and radiation therapy), who presents with fall and syncope.  Per report, pt had fall and was found down in bathroom at noon today, possibly fell around 8: 32.  Patient does not know what happened to her.  She possibly passed out.  She states that she has lightheadedness and dizziness. She states that she was too weak to get up off the floor until a family member found her.  Currently no unilateral numbness or tingling in extremities.  No facial droop or slurred speech.  She obviously injured her face and head.  She has bruise around her left eye and left face.  Denies chest pain, cough, SOB.  No nausea, vomiting, diarrhea or abdominal pain.  No symptoms of UTI.  No fever or chills.    Data reviewed independently and ED Course: pt was found to have WBC 10.1, stable renal function, CK1728, sodium 127, negative UA.  Temperature normal, blood pressure 127/115, heart rate 84, RR 17, oxygen saturation 100% on room air.  CT of C-spine negative for acute injury.   CT of head  1. No evidence of an acute intracranial abnormality. 2. Prominent forehead and left periorbital hematomas, incompletely imaged. 3. Moderate chronic small vessel ischemic changes within the cerebral white matter. 4. 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   CT cervical spine:  1. No evidence of an acute cervical spine fracture. 2. Levocurvature of the cervical spine. 3. Nonspecific  reversal of the expected cervical lordosis. 4. Mild C3-C4 grade 1 anterolisthesis. 5. Cervical spondylosis as described. 6. Left perimandibular hematoma.     CT of the maxillofacial image: 1. Extensive left preseptal soft tissue swelling. No postseptal inflammatory change or retro-orbital hematoma. 2. Small left frontal supraorbital scalp hematoma. 3. Soft tissue swelling superficial to the left mandibular body. 4. No acute fracture or mandibular dislocation.   EKG: I have personally reviewed.  Sinus rhythm, QTc 503, right bundle blockade, early R wave progression.   Review of Systems:   General: no fevers, chills, no body weight gain, has fatigue. HEENT: no blurry vision, hearing changes or sore throat Respiratory: no dyspnea, coughing, wheezing CV: no chest pain, no palpitations GI: no nausea, vomiting, abdominal pain, diarrhea, constipation GU: no dysuria, burning on urination, increased urinary frequency, hematuria  Ext: has leg edema Neuro: no unilateral weakness, numbness, or tingling, no vision change or hearing loss. Has fall and syncope Skin: no rash, no skin tear. MSK: No muscle spasm, no deformity, no limitation of range of movement in spin Heme: No easy bruising.  Travel history: No recent long distant travel.   Allergy: No Known Allergies  Past Medical History:  Diagnosis Date   Anxiety    Cancer (HCC)    2006 right side breast ca. lumpectomy, skin ca on same breast    GERD (gastroesophageal reflux disease)    Hypercholesteremia    Peripheral neuropathic pain    Personal history of chemotherapy    2006/2014   Personal history  of radiation therapy    2006/2014   Pulmonary emboli [facility])    Urinary incontinence     Past Surgical History:  Procedure Laterality Date   APPENDECTOMY     CARDIAC CATHETERIZATION Left 05/23/2016   Procedure: Left Heart Cath and Coronary Angiography;  Surgeon: Marsa Dooms, MD;  Location: [facility] INVASIVE CV LAB;  Service:  Cardiovascular;  Laterality: Left;   IVC FILTER INSERTION N/A 08/19/2018   Procedure: IVC FILTER INSERTION;  Surgeon: Marea Selinda RAMAN, MD;  Location: [facility] INVASIVE CV LAB;  Service: Cardiovascular;  Laterality: N/A;   MASTECTOMY Right 2006/2014   rt breast removal     cancer    Social History:  reports that she has never smoked. She has never used smokeless tobacco. She reports that she does not drink alcohol and does not use drugs.  Family History:  Family History  Problem Relation Age of Onset   Asthma Mother    COPD Mother    CVA Father    Heart attack Father    Coronary artery disease Father    Schizophrenia Brother    Peripheral vascular disease Brother    Breast cancer Neg Hx      Prior to Admission medications   Medication Sig Start Date End Date Taking? Authorizing Provider  acetaminophen  (TYLENOL ) 325 MG tablet Take 325 mg by mouth every 4 (four) hours as needed.    [provider]  apixaban  (ELIQUIS ) 5 MG TABS tablet Take 1 tablet (5 mg total) by mouth 2 (two) times daily. 11/26/21   Jens Durand, MD  atorvastatin  (LIPITOR) 40 MG tablet Take 1 tablet (40 mg total) by mouth daily. 02/07/22 03/20/23  Ghimire, Kuber, MD  cetirizine (ZYRTEC) 10 MG tablet Take 10 mg by mouth at bedtime.    [provider]  Cholecalciferol  125 MCG (5000 UT) TABS Take 5,000 Units by mouth every morning.    [provider]  Cyanocobalamin  500 MCG TBDP Take 500 mcg by mouth every morning.    [provider]  fluticasone  (FLONASE ) 50 MCG/ACT nasal spray Place 2 sprays into both nostrils daily as needed for allergies or rhinitis. 07/06/19   [provider]  gabapentin  (NEURONTIN ) 300 MG capsule Take 600 mg by mouth at bedtime.    [provider]  levothyroxine  (SYNTHROID , LEVOTHROID) 125 MCG tablet Take 125 mcg by mouth.  12/19/17 03/20/23  [provider]  memantine  (NAMENDA ) 5 MG tablet Take 5 mg by mouth 2 (two) times daily. 11/20/22   [provider]  omeprazole (PRILOSEC) 20 MG capsule Take 20 mg by mouth daily.    [provider]  temazepam  (RESTORIL ) 15 MG capsule Take 15 mg by mouth at bedtime. 11/28/21   [provider]  traZODone  (DESYREL ) 50 MG tablet Take 50 mg by mouth at bedtime. 10/16/21   [provider]    Physical Exam: Vitals:   02/23/24 1512 02/23/24 1730 02/23/24 2321 02/23/24 2340  BP: 110/69 (!) 127/115  115/86  Pulse: 84 65  72  Resp: 17 16  16   Temp: 98.2 F (36.8 C)  98.2 F (36.8 C) 98.5 F (36.9 C)  TempSrc: Oral   Oral  SpO2: 100% 100%  100%  Weight: 80 kg     Height: 5' 7 (1.702 m)      General: Not in acute distress HEENT: has left periorbital hematoma and bruies       Eyes: PERRL, EOMI, no jaundice       ENT: No  discharge from the ears and nose, no pharynx injection, no tonsillar enlargement.        Neck: No JVD, no bruit, no mass felt. Heme: No neck lymph node enlargement. Cardiac: S1/S2, RRR, No murmurs, No gallops or rubs. Respiratory: No rales, wheezing, rhonchi or rubs. GI: Soft, nondistended, nontender, no rebound pain, no organomegaly, BS present. GU: No hematuria Ext: 2+ pitting leg edema bilaterally. 1+DP/PT pulse bilaterally. Musculoskeletal: No joint deformities, No joint redness or warmth, no limitation of ROM in spin. Skin: No rashes.  Neuro: Alert, oriented X3, cranial nerves II-XII grossly intact, moves all extremities normally.  Psych: Patient is not psychotic, no suicidal or hemocidal ideation.  Labs on Admission: I have personally reviewed following labs and imaging studies  CBC: Recent Labs  Lab 02/23/24 1801  WBC 10.1  NEUTROABS 8.8*  HGB 13.5  HCT 40.9  MCV 81.6  PLT 137*   Basic Metabolic Panel: Recent Labs  Lab 02/23/24 1801 02/23/24 2344  NA 127*  --   K 3.5  --   CL 94*  --   CO2 18*  --   GLUCOSE 104*  --   BUN 21  --   CREATININE 1.48*  --   CALCIUM  8.7*  --   MG  --  1.7  PHOS  --  2.8   GFR: Estimated  Creatinine Clearance: 36.3 mL/min (A) (by C-G formula based on SCr of 1.48 mg/dL (H)). Liver Function Tests: No results for input(s): AST, ALT, ALKPHOS, BILITOT, PROT, ALBUMIN in the last 168 hours. No results for input(s): LIPASE, AMYLASE in the last 168 hours. No results for input(s): AMMONIA in the last 168 hours. Coagulation Profile: No results for input(s): INR, PROTIME in the last 168 hours. Cardiac Enzymes: Recent Labs  Lab 02/23/24 1800  CKTOTAL 1,728*   BNP (last 3 results) No results for input(s): PROBNP in the last 8760 hours. HbA1C: No results for input(s): HGBA1C in the last 72 hours. CBG: No results for input(s): GLUCAP in the last 168 hours. Lipid Profile: No results for input(s): CHOL, HDL, LDLCALC, TRIG, CHOLHDL, LDLDIRECT in the last 72 hours. Thyroid Function Tests: No results for input(s): TSH, T4TOTAL, FREET4, T3FREE, THYROIDAB in the last 72 hours. Anemia Panel: No results for input(s): VITAMINB12, FOLATE, FERRITIN, TIBC, IRON, RETICCTPCT in the last 72 hours. Urine analysis:    Component Value Date/Time   COLORURINE STRAW (A) 02/23/2024 1801   APPEARANCEUR CLEAR (A) 02/23/2024 1801   LABSPEC 1.003 (L) 02/23/2024 1801   PHURINE 6.0 02/23/2024 1801   GLUCOSEU NEGATIVE 02/23/2024 1801   HGBUR LARGE (A) 02/23/2024 1801   BILIRUBINUR NEGATIVE 02/23/2024 1801   KETONESUR 5 (A) 02/23/2024 1801   PROTEINUR NEGATIVE 02/23/2024 1801   NITRITE NEGATIVE 02/23/2024 1801   LEUKOCYTESUR NEGATIVE 02/23/2024 1801   Sepsis Labs: @LABRCNTIP (procalcitonin:4,lacticidven:4) )No results found for this or any previous visit (from the past 240 hours).   Radiological Exams on Admission:   Assessment/Plan Principal Problem:   Rhabdomyolysis Active Problems:   Fall at home, initial encounter   possible syncope   Hyponatremia   Acquired hypothyroidism   HLD (hyperlipidemia)   Pulmonary embolism (HCC)    DVT (deep venous thrombosis) (HCC)   Stroke (HCC)   Chronic kidney disease, stage 3b (HCC)   Overweight (BMI 25.0-29.9)   Assessment and Plan:  Rhabdomyolysis: CK level 1728.  Renal function stable. - Place in telemetry bed for observation - IV fluid: 1 L normal saline, then 75 cc/h - Repeat CK level  in the morning  Fall at home, initial encounter and possible syncope: Etiology is not clear.  CT head negative for acute intracranial abnormalities (showed periorbital hematoma).  No focal neurodeficit on physical examination. The differential diagnosis is broad, including vasovagal syncope, orthostatic status. No focal neurodeficit on physical examination.  Low suspicion for stroke. - Orthostatic vital signs  - Frequent neuro checks  - IVF: as above - PT/OT eval and treat  Hyponatremia: Na 127. - Will check urine sodium, urine osmolality, serum osmolality. - check TSH - Fluid restriction - IVF: 1L NS , will continue with IV normal saline at 75 mL/h - Sodium chloride  tablet 1 g twice daily - f/u by BMP q8h - avoid over correction too fast due to risk of central pontine myelinolysis  Acquired hypothyroidism -Synthroid   HLD (hyperlipidemia) -Lipitor  Pulmonary embolism (HCC) and DVT (deep venous thrombosis) (HCC) -Temporarily hold Eliquis  tonight due to hematoma, restart Eliquis  in the morning  Stroke (HCC) -Lipitor - Start Eliquis   Chronic kidney disease, stage 3b (HCC): renal function stable.  Recent baseline creatinine 1.40 on 03/20/2023.  Her creatinine is 1.48, BUN 21, GFR 37 -Follow-up with BMP  Overweight (BMI 25.0-29.9): Body weight 80 kg, BMI 27.62 - Encourage losing weight - Exercise and healthy diet       DVT ppx: on Eliquis   Code Status: Full code   Family Communication:     not done, no family member is at bed side.    Disposition Plan:  Anticipate discharge back to previous environment  Consults called:  none  Admission status and Level of care:  Telemetry Medical:    for obs as inpt        Dispo: The patient is from: Home              Anticipated d/c is to: Home              Anticipated d/c date is: 1 day              Patient currently is not medically stable to d/c.    Severity of Illness:  The appropriate patient status for this patient is OBSERVATION. Observation status is judged to be reasonable and necessary in order to provide the required intensity of service to ensure the patient's safety. The patient's presenting symptoms, physical exam findings, and initial radiographic and laboratory data in the context of their medical condition is felt to place them at decreased risk for further clinical deterioration. Furthermore, it is anticipated that the patient will be medically stable for discharge from the hospital within 2 midnights of admission.        Date of Service 02/24/2024    Caleb Exon [facility]   If 7PM-7AM, please contact night-coverage [email or web address] 02/24/2024, 3:00 AM

## 2024-02-23 NOTE — ED Triage Notes (Signed)
 Patient to ED via [facility] from home for a fall. PT states she does not know how she fell. PT found by family face down in bathroom- unsure when she fell. Bruising noted on left eye and left side of face. On blood thinners. Hx of dementia but today much worse than normal.

## 2024-02-23 NOTE — ED Provider Notes (Signed)
 [facility] Provider Note   Event Date/Time   First MD Initiated Contact with Patient 02/23/24 1728     (approximate) History  Fall  HPI [name] [name] is a 73 y.o. female with a past medical history of Alzheimer's dementia who presents after a fall from standing in her home today.  Patient states that she does not move or losing consciousness.  Patient states that this fall occurred at approximately 830 and she was found at approximately noon today.  Patient denies any pains other than facial pain specifically around the left eye and left jaw.  Patient does not remember exactly how she fell however she does endorse getting up and feeling lightheaded before this happened.  Patient states that she was too weak to get up off the floor until a family member found her. ROS: Patient currently denies any vision changes, tinnitus, difficulty speaking, facial droop, sore throat, chest pain, shortness of breath, abdominal pain, nausea/vomiting/diarrhea, dysuria, or weakness/numbness/paresthesias in any extremity   Physical Exam  Triage Vital Signs: ED Triage Vitals [02/23/24 1512]  Encounter Vitals Group     BP 110/69     Girls Systolic BP Percentile      Girls Diastolic BP Percentile      Boys Systolic BP Percentile      Boys Diastolic BP Percentile      Pulse Rate 84     Resp 17     Temp 98.2 F (36.8 C)     Temp Source Oral     SpO2 100 %     Weight 176 lb 5.9 oz (80 kg)     Height 5' 7 (1.702 m)     Head Circumference      Peak Flow      Pain Score      Pain Loc      Pain Education      Exclude from Growth Chart    Most recent vital signs: Vitals:   02/23/24 1512 02/23/24 1730  BP: 110/69 (!) 127/115  Pulse: 84 65  Resp: 17 16  Temp: 98.2 F (36.8 C)   SpO2: 100% 100%   General: Awake, oriented x4. CV:  Good peripheral perfusion. Resp:  Normal effort. Abd:  No distention. Other:  Elderly overweight Caucasian female resting comfortably in no  acute distress.  Left periorbital ecchymosis and edema.  Edema and ecchymosis over the left mandibular region ED Results / Procedures / Treatments  Labs (all labs ordered are listed, but only abnormal results are displayed) Labs Reviewed  CBC WITH DIFFERENTIAL/PLATELET - Abnormal; Notable for the following components:      Result Value   Platelets 137 (*)    Neutro Abs 8.8 (*)    Lymphs Abs 0.6 (*)    All other components within normal limits  BASIC METABOLIC PANEL WITH GFR - Abnormal; Notable for the following components:   Sodium 127 (*)    Chloride 94 (*)    CO2 18 (*)    Glucose, Bld 104 (*)    Creatinine, Ser 1.48 (*)    Calcium  8.7 (*)    GFR, Estimated 37 (*)    All other components within normal limits  URINALYSIS, W/ REFLEX TO CULTURE (INFECTION SUSPECTED) - Abnormal; Notable for the following components:   Color, Urine STRAW (*)    APPearance CLEAR (*)    Specific Gravity, Urine 1.003 (*)    Hgb urine dipstick LARGE (*)    Ketones, ur 5 (*)  All other components within normal limits  CK - Abnormal; Notable for the following components:   Total CK 1,728 (*)    All other components within normal limits  OSMOLALITY, URINE - Abnormal; Notable for the following components:   Osmolality, Ur 129 (*)    All other components within normal limits  SODIUM, URINE, RANDOM  MAGNESIUM   PHOSPHORUS  CK  OSMOLALITY  BASIC METABOLIC PANEL WITH GFR  CBC   EKG ED ECG REPORT I, Artist MARLA Kerns, the attending physician, personally viewed and interpreted this ECG. Date: 02/23/2024 EKG Time: 1907 Rate: 86 Rhythm: normal sinus rhythm QRS Axis: normal Intervals: normal ST/T Wave abnormalities: normal Narrative Interpretation: no evidence of acute ischemia RADIOLOGY ED MD interpretation: CT of the head without contrast interpreted by me shows no evidence of acute abnormalities including no intracerebral hemorrhage, obvious masses, or significant edema CT of the cervical spine  interpreted by me does not show any evidence of acute abnormalities including no acute fracture, malalignment, height loss, or dislocation CT of the maxillofacial structures independently interpreted and shows extensive left preseptal soft tissue swelling.  There is soft tissue swelling superficial the left mandibular body - All radiology independently interpreted and agree with radiology assessment Official radiology report(s): CT Maxillofacial Wo Contrast Result Date: 02/23/2024 CLINICAL DATA:  Facial trauma, blunt EXAM: CT MAXILLOFACIAL WITHOUT CONTRAST TECHNIQUE: Multidetector CT imaging of the maxillofacial structures was performed. Multiplanar CT image reconstructions were also generated. RADIATION DOSE REDUCTION: This exam was performed according to the departmental dose-optimization program which includes automated exposure control, adjustment of the mA and/or kV according to patient size and/or use of iterative reconstruction technique. COMPARISON:  None Available. FINDINGS: Osseous: No fracture or mandibular dislocation. No destructive process. Orbits: Extensive left preseptal soft tissue swelling. No postseptal inflammatory change or retro-orbital hematoma. Ocular globes are intact and ocular lenses are ortho topically position. Extraocular musculature and optic nerves are unremarkable. Sinuses: Clear. Soft tissues: See above. Small left frontal supraorbital scalp hematoma. Soft tissue swelling superficial to the left mandibular body. Limited intracranial: No significant or unexpected finding. IMPRESSION: 1. Extensive left preseptal soft tissue swelling. No postseptal inflammatory change or retro-orbital hematoma. 2. Small left frontal supraorbital scalp hematoma. 3. Soft tissue swelling superficial to the left mandibular body. 4. No acute fracture or mandibular dislocation. Electronically Signed   By: Dorethia Molt M.D.   On: 02/23/2024 19:39   CT Head Wo Contrast Result Date:  02/23/2024 CLINICAL DATA:  Provided history: Ataxia, head trauma. Neck trauma. Additional history provided: Fall. EXAM: CT HEAD WITHOUT CONTRAST CT CERVICAL SPINE WITHOUT CONTRAST TECHNIQUE: Multidetector CT imaging of the head and cervical spine was performed following the standard protocol without intravenous contrast. Multiplanar CT image reconstructions of the cervical spine were also generated. RADIATION DOSE REDUCTION: This exam was performed according to the departmental dose-optimization program which includes automated exposure control, adjustment of the mA and/or kV according to patient size and/or use of iterative reconstruction technique. COMPARISON:  Brain MRI 03/20/2023. MRA head 03/20/2023. Head CT 03/20/2023. FINDINGS: CT HEAD FINDINGS Brain: No age-advanced or lobar predominant cerebral atrophy. Patchy and ill-defined hypoattenuation within the cerebral white matter, nonspecific but compatible with moderate chronic small vessel ischemic disease. 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 There is no acute intracranial hemorrhage. No demarcated cortical infarct. No extra-axial fluid collection. No midline shift. Vascular: No hyperdense vessel. Atherosclerotic calcifications. Skull: No calvarial fracture or aggressive  osseous lesion. Sinuses/Orbits: Prominent forehead and left periorbital hematomas, incompletely imaged. No acute finding within the orbits. No significant paranasal sinus disease at the imaged levels. CT CERVICAL SPINE FINDINGS Alignment: Levocurvature of the cervical spine. Nonspecific reversal of the expected cervical lordosis. 2 mm C3-C4 grade 1 anterolisthesis. Skull base and vertebrae: The basion-dental and atlanto-dental intervals are maintained.No evidence of acute fracture to the cervical spine. Soft tissues and spinal canal: No prevertebral fluid or swelling. No visible canal hematoma.  Left perimandibular hematoma. Disc levels: Cervical spondylosis with multilevel disc space narrowing, disc bulges/central disc protrusions, posterior disc osteophyte complexes, uncovertebral hypertrophy and facet arthropathy. Disc space narrowing is greatest at C4-C5, C5-C6 and C6-C7 (fairly advanced at these levels). No appreciable high-grade spinal canal stenosis. Multilevel bony neural foraminal narrowing. Multilevel ventral osteophytes. Degenerative changes also present at the C1-C2 articulation. Upper chest: No consolidation within the imaged lung apices. No visible pneumothorax. IMPRESSION: CT head: 1. No evidence of an acute intracranial abnormality. 2. Prominent forehead and left periorbital hematomas, incompletely imaged. 3. Moderate chronic small vessel ischemic changes within the cerebral white matter. 4. 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 CT cervical spine: 1. No evidence of an acute cervical spine fracture. 2. Levocurvature of the cervical spine. 3. Nonspecific reversal of the expected cervical lordosis. 4. Mild C3-C4 grade 1 anterolisthesis. 5. Cervical spondylosis as described. 6. Left perimandibular hematoma. Electronically Signed   By: Rockey Childs D.O.   On: 02/23/2024 16:12   CT Cervical Spine Wo Contrast Result Date: 02/23/2024 CLINICAL DATA:  Provided history: Ataxia, head trauma. Neck trauma. Additional history provided: Fall. EXAM: CT HEAD WITHOUT CONTRAST CT CERVICAL SPINE WITHOUT CONTRAST TECHNIQUE: Multidetector CT imaging of the head and cervical spine was performed following the standard protocol without intravenous contrast. Multiplanar CT image reconstructions of the cervical spine were also generated. RADIATION DOSE REDUCTION: This exam was performed according to the departmental dose-optimization program which includes automated exposure control, adjustment of the mA and/or kV  according to patient size and/or use of iterative reconstruction technique. COMPARISON:  Brain MRI 03/20/2023. MRA head 03/20/2023. Head CT 03/20/2023. FINDINGS: CT HEAD FINDINGS Brain: No age-advanced or lobar predominant cerebral atrophy. Patchy and ill-defined hypoattenuation within the cerebral white matter, nonspecific but compatible with moderate chronic small vessel ischemic disease. 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 There is no acute intracranial hemorrhage. No demarcated cortical infarct. No extra-axial fluid collection. No midline shift. Vascular: No hyperdense vessel. Atherosclerotic calcifications. Skull: No calvarial fracture or aggressive osseous lesion. Sinuses/Orbits: Prominent forehead and left periorbital hematomas, incompletely imaged. No acute finding within the orbits. No significant paranasal sinus disease at the imaged levels. CT CERVICAL SPINE FINDINGS Alignment: Levocurvature of the cervical spine. Nonspecific reversal of the expected cervical lordosis. 2 mm C3-C4 grade 1 anterolisthesis. Skull base and vertebrae: The basion-dental and atlanto-dental intervals are maintained.No evidence of acute fracture to the cervical spine. Soft tissues and spinal canal: No prevertebral fluid or swelling. No visible canal hematoma. Left perimandibular hematoma. Disc levels: Cervical spondylosis with multilevel disc space narrowing, disc bulges/central disc protrusions, posterior disc osteophyte complexes, uncovertebral hypertrophy and facet arthropathy. Disc space narrowing is greatest at C4-C5, C5-C6 and C6-C7 (fairly advanced at these levels). No appreciable high-grade spinal canal stenosis. Multilevel bony neural foraminal narrowing. Multilevel ventral osteophytes. Degenerative changes also present at the C1-C2  articulation. Upper chest: No consolidation within the imaged lung apices. No visible  pneumothorax. IMPRESSION: CT head: 1. No evidence of an acute intracranial abnormality. 2. Prominent forehead and left periorbital hematomas, incompletely imaged. 3. Moderate chronic small vessel ischemic changes within the cerebral white matter. 4. 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 CT cervical spine: 1. No evidence of an acute cervical spine fracture. 2. Levocurvature of the cervical spine. 3. Nonspecific reversal of the expected cervical lordosis. 4. Mild C3-C4 grade 1 anterolisthesis. 5. Cervical spondylosis as described. 6. Left perimandibular hematoma. Electronically Signed   By: Rockey Childs D.O.   On: 02/23/2024 16:12   PROCEDURES: Critical Care performed: No .1-3 Lead EKG Interpretation  Performed by: Jossie Artist POUR, MD Authorized by: Jossie Artist POUR, MD     Interpretation: normal     ECG rate:  61   ECG rate assessment: normal     Rhythm: sinus rhythm     Ectopy: none     Conduction: normal    MEDICATIONS ORDERED IN ED: Medications  sodium chloride  0.9 % bolus 1,000 mL (has no administration in time range)  0.9 %  sodium chloride  infusion (has no administration in time range)  sodium chloride  tablet 1 g (has no administration in time range)  diphenhydrAMINE  (BENADRYL ) injection 12.5 mg (has no administration in time range)  acetaminophen  (TYLENOL ) tablet 650 mg (has no administration in time range)  oxyCODONE -acetaminophen  (PERCOCET/ROXICET) 5-325 MG per tablet 1 tablet (has no administration in time range)   IMPRESSION / MDM / ASSESSMENT AND PLAN / ED COURSE  I reviewed the triage vital signs and the nursing notes.                             The patient is on the cardiac monitor to evaluate for evidence of arrhythmia and/or significant heart rate changes. Patient's presentation is most consistent with acute presentation with potential threat to life or bodily function. Patient  presents with complaints of syncope/presyncope ED Workup:  CBC, BMP, Troponin, ECG, CXR, head CT, neck CT, CT max face Differential diagnosis includes HF, ICH, seizure, stroke, HOCM, ACS, aortic dissection, malignant arrhythmia, or GI bleed. Findings: No evidence of acute laboratory abnormalities.  Troponin negative x1 EKG: No e/o STEMI. No evidence of Brugadas sign, delta wave, epsilon wave, significantly prolonged QTc, or malignant arrhythmia.  Disposition: Admit to medicine, telemetry bed for cardiac monitoring and cardiology review.   FINAL CLINICAL IMPRESSION(S) / ED DIAGNOSES   Final diagnoses:  Syncope, unspecified syncope type  Periorbital ecchymosis of left eye, initial encounter   Rx / DC Orders   ED Discharge Orders     None      Note:  This document was prepared using Dragon voice recognition software and may include unintentional dictation errors.   Jossie Artist POUR, MD 02/23/24 [phone]

## 2024-02-23 NOTE — ED Provider Triage Note (Signed)
 Emergency Medicine Provider Triage Evaluation Note  [name] [name] , a 74 y.o. female  was evaluated in triage.  Pt complains of fall.  Patient was brought by EMS, family is with her, son states he found her on the floor of her bathroom, loss of consciousness is  unknown.  Is taking Eliquis .  Per family patient has dementia and Alzheimer but today she is more disoriented and repeating the same phrases.  Patient denies localized pain.  Review of Systems  Positive:  Negative:   Physical Exam  BP 110/69   Pulse 84   Temp 98.2 F (36.8 C) (Oral)   Resp 17   Ht 5' 7 (1.702 m)   Wt 80 kg   SpO2 100%   BMI 27.62 kg/m  Gen:   Awake, no distress during triage vital signs are normal Resp:  Normal effort  MSK:   Moves extremities without difficulty  Other:  Face: Ecchymoses in left eye, and left labial commissure.   Medical Decision Making  Medically screening exam initiated at 3:14 PM.  Appropriate orders placed.  [name] [name] was informed that the remainder of the evaluation will be completed by another provider, this initial triage assessment does not replace that evaluation, and the importance of remaining in the ED until their evaluation is complete.  Patient was brought by emeses, after found on the floor her restroom, patient is taking Eliquis , ordered CT of the head, CT of the neck, CBC and CMP.   Janit Kast, PA-C 02/23/24 1517

## 2024-02-24 ENCOUNTER — Observation Stay

## 2024-02-24 DIAGNOSIS — Z8673 Personal history of transient ischemic attack (TIA), and cerebral infarction without residual deficits: Secondary | ICD-10-CM | POA: Diagnosis not present

## 2024-02-24 DIAGNOSIS — F0280 Dementia in other diseases classified elsewhere without behavioral disturbance: Secondary | ICD-10-CM | POA: Diagnosis present

## 2024-02-24 DIAGNOSIS — E7800 Pure hypercholesterolemia, unspecified: Secondary | ICD-10-CM | POA: Diagnosis present

## 2024-02-24 DIAGNOSIS — Z7901 Long term (current) use of anticoagulants: Secondary | ICD-10-CM | POA: Diagnosis not present

## 2024-02-24 DIAGNOSIS — Z923 Personal history of irradiation: Secondary | ICD-10-CM | POA: Diagnosis not present

## 2024-02-24 DIAGNOSIS — N1832 Chronic kidney disease, stage 3b: Secondary | ICD-10-CM | POA: Diagnosis present

## 2024-02-24 DIAGNOSIS — Z823 Family history of stroke: Secondary | ICD-10-CM | POA: Diagnosis not present

## 2024-02-24 DIAGNOSIS — G459 Transient cerebral ischemic attack, unspecified: Secondary | ICD-10-CM

## 2024-02-24 DIAGNOSIS — T796XXA Traumatic ischemia of muscle, initial encounter: Secondary | ICD-10-CM | POA: Diagnosis not present

## 2024-02-24 DIAGNOSIS — E039 Hypothyroidism, unspecified: Secondary | ICD-10-CM | POA: Diagnosis present

## 2024-02-24 DIAGNOSIS — I950 Idiopathic hypotension: Secondary | ICD-10-CM

## 2024-02-24 DIAGNOSIS — E871 Hypo-osmolality and hyponatremia: Secondary | ICD-10-CM | POA: Diagnosis present

## 2024-02-24 DIAGNOSIS — R55 Syncope and collapse: Secondary | ICD-10-CM | POA: Diagnosis present

## 2024-02-24 DIAGNOSIS — E663 Overweight: Secondary | ICD-10-CM | POA: Diagnosis present

## 2024-02-24 DIAGNOSIS — R29713 NIHSS score 13: Secondary | ICD-10-CM | POA: Diagnosis present

## 2024-02-24 DIAGNOSIS — Z853 Personal history of malignant neoplasm of breast: Secondary | ICD-10-CM | POA: Diagnosis not present

## 2024-02-24 DIAGNOSIS — E876 Hypokalemia: Secondary | ICD-10-CM | POA: Diagnosis not present

## 2024-02-24 DIAGNOSIS — R4701 Aphasia: Secondary | ICD-10-CM | POA: Diagnosis present

## 2024-02-24 DIAGNOSIS — G9349 Other encephalopathy: Secondary | ICD-10-CM | POA: Diagnosis present

## 2024-02-24 DIAGNOSIS — Y92009 Unspecified place in unspecified non-institutional (private) residence as the place of occurrence of the external cause: Secondary | ICD-10-CM | POA: Diagnosis not present

## 2024-02-24 DIAGNOSIS — Z79890 Hormone replacement therapy (postmenopausal): Secondary | ICD-10-CM | POA: Diagnosis not present

## 2024-02-24 DIAGNOSIS — Z8249 Family history of ischemic heart disease and other diseases of the circulatory system: Secondary | ICD-10-CM | POA: Diagnosis not present

## 2024-02-24 DIAGNOSIS — E872 Acidosis, unspecified: Secondary | ICD-10-CM | POA: Diagnosis present

## 2024-02-24 DIAGNOSIS — G9340 Encephalopathy, unspecified: Secondary | ICD-10-CM | POA: Insufficient documentation

## 2024-02-24 DIAGNOSIS — Z95828 Presence of other vascular implants and grafts: Secondary | ICD-10-CM | POA: Diagnosis not present

## 2024-02-24 DIAGNOSIS — Z9221 Personal history of antineoplastic chemotherapy: Secondary | ICD-10-CM | POA: Diagnosis not present

## 2024-02-24 DIAGNOSIS — G309 Alzheimer's disease, unspecified: Secondary | ICD-10-CM | POA: Diagnosis present

## 2024-02-24 DIAGNOSIS — Z9011 Acquired absence of right breast and nipple: Secondary | ICD-10-CM | POA: Diagnosis not present

## 2024-02-24 DIAGNOSIS — M6282 Rhabdomyolysis: Secondary | ICD-10-CM | POA: Diagnosis present

## 2024-02-24 DIAGNOSIS — S062XAA Diffuse traumatic brain injury with loss of consciousness status unknown, initial encounter: Secondary | ICD-10-CM | POA: Diagnosis present

## 2024-02-24 DIAGNOSIS — W1830XA Fall on same level, unspecified, initial encounter: Secondary | ICD-10-CM | POA: Diagnosis present

## 2024-02-24 LAB — BASIC METABOLIC PANEL WITH GFR
Anion gap: 9 (ref 5–15)
Anion gap: 12 (ref 5–15)
BUN: 20 mg/dL (ref 8–23)
BUN: 19 mg/dL (ref 8–23)
CO2: 22 mmol/L (ref 22–32)
CO2: 21 mmol/L — ABNORMAL LOW (ref 22–32)
Calcium: 8.2 mg/dL — ABNORMAL LOW (ref 8.9–10.3)
Calcium: 8.4 mg/dL — ABNORMAL LOW (ref 8.9–10.3)
Chloride: 104 mmol/L (ref 98–111)
Chloride: 103 mmol/L (ref 98–111)
Creatinine, Ser: 1.37 mg/dL — ABNORMAL HIGH (ref 0.44–1.00)
Creatinine, Ser: 1.5 mg/dL — ABNORMAL HIGH (ref 0.44–1.00)
GFR, Estimated: 41 mL/min — ABNORMAL LOW (ref >60)
GFR, Estimated: 36 mL/min — ABNORMAL LOW (ref >60)
Glucose, Bld: 115 mg/dL — ABNORMAL HIGH (ref 70–99)
Glucose, Bld: 115 mg/dL — ABNORMAL HIGH (ref 70–99)
Potassium: 3.1 mmol/L — ABNORMAL LOW (ref 3.5–5.1)
Potassium: 3.3 mmol/L — ABNORMAL LOW (ref 3.5–5.1)
Sodium: 135 mmol/L (ref 135–145)
Sodium: 136 mmol/L (ref 135–145)

## 2024-02-24 LAB — BLOOD GAS, ARTERIAL
Acid-base deficit: 2.2 mmol/L — ABNORMAL HIGH (ref 0.0–2.0)
Bicarbonate: 23.2 mmol/L (ref 20.0–28.0)
O2 Saturation: 93.9 %
Patient temperature: 37
pCO2 arterial: 41 mmHg (ref 32–48)
pH, Arterial: 7.36 (ref 7.35–7.45)
pO2, Arterial: 64 mmHg — ABNORMAL LOW (ref 83–108)

## 2024-02-24 LAB — CBC
HCT: 37.6 % (ref 36.0–46.0)
Hemoglobin: 12.4 g/dL (ref 12.0–15.0)
MCH: 26.2 pg (ref 26.0–34.0)
MCHC: 33 g/dL (ref 30.0–36.0)
MCV: 79.3 fL — ABNORMAL LOW (ref 80.0–100.0)
Platelets: 186 K/uL (ref 150–400)
RBC: 4.74 MIL/uL (ref 3.87–5.11)
RDW: 14.8 % (ref 11.5–15.5)
WBC: 7.7 K/uL (ref 4.0–10.5)
nRBC: 0 % (ref 0.0–0.2)

## 2024-02-24 LAB — GLUCOSE, CAPILLARY
Glucose-Capillary: 106 mg/dL — ABNORMAL HIGH (ref 70–99)
Glucose-Capillary: 113 mg/dL — ABNORMAL HIGH (ref 70–99)
Glucose-Capillary: 116 mg/dL — ABNORMAL HIGH (ref 70–99)
Glucose-Capillary: 119 mg/dL — ABNORMAL HIGH (ref 70–99)
Glucose-Capillary: 126 mg/dL — ABNORMAL HIGH (ref 70–99)
Glucose-Capillary: 89 mg/dL (ref 70–99)
Glucose-Capillary: 89 mg/dL (ref 70–99)

## 2024-02-24 LAB — TSH: TSH: 1.88 u[IU]/mL (ref 0.350–4.500)

## 2024-02-24 LAB — OSMOLALITY: Osmolality: 282 mosm/kg (ref 275–295)

## 2024-02-24 LAB — PHOSPHORUS: Phosphorus: 2.8 mg/dL (ref 2.5–4.6)

## 2024-02-24 LAB — MAGNESIUM: Magnesium: 1.7 mg/dL (ref 1.7–2.4)

## 2024-02-24 LAB — CK: Total CK: 3623 U/L — ABNORMAL HIGH (ref 38–234)

## 2024-02-24 MED ORDER — POTASSIUM CHLORIDE IN NACL 20-0.9 MEQ/L-% IV SOLN
INTRAVENOUS | Status: AC
  Filled 2024-02-24 (×3): qty 1000

## 2024-02-24 MED ORDER — CHLORHEXIDINE GLUCONATE CLOTH 2 % EX PADS
6.0000 | MEDICATED_PAD | Freq: Every day | CUTANEOUS | Status: DC
  Administered 2024-02-24 – 2024-02-26 (×3): 6 via TOPICAL

## 2024-02-24 MED ORDER — POTASSIUM CHLORIDE CRYS ER 20 MEQ PO TBCR: 40.0000 meq | EXTENDED_RELEASE_TABLET | ORAL | Status: DC

## 2024-02-24 MED ORDER — POTASSIUM CHLORIDE 10 MEQ/100ML IV SOLN
10.0000 meq | INTRAVENOUS | Status: AC
  Administered 2024-02-24 (×2): 10 meq via INTRAVENOUS
  Filled 2024-02-24 (×2): qty 100

## 2024-02-24 MED ORDER — NOREPINEPHRINE 4 MG/250ML-% IV SOLN
0.0000 ug/min | INTRAVENOUS | Status: DC
  Administered 2024-02-24: 2 ug/min via INTRAVENOUS
  Filled 2024-02-24: qty 250

## 2024-02-24 NOTE — Progress Notes (Signed)
  Inpatient Rehab Admissions Coordinator :  Per therapy recommendations, patient was screened for CIR candidacy by Ottie Glazier RN MSN.  At this time patient appears to be a potential candidate for CIR. I will place a rehab consult per protocol for full assessment. Please call me with any questions.  Ottie Glazier RN MSN Admissions Coordinator [phone]

## 2024-02-24 NOTE — Hospital Course (Signed)
 [name] [name] is a 73 y.o. female with medical history significant of stroke, hypothyroidism, GERD, dementia, CKD-3B, PE/DVT on Eliquis , neuropathy, urinary incontinence, breast cancer (s/p of right mastectomy, chemo and radiation therapy), who presents with fall and syncope.  Per the son, patient only has a mild aphasia, was walking without assist before.  She had a fall prior to her admission to the hospital. Upon arriving to hospital, patient had hyponatremia at 127, metabolic acidosis of 18, rhabdomyolysis of 3623. In the morning of 7/29, patient mental status seem to be worsening, she only respond to painful stimuli.  Has been seen by neurology, CT head did not show any acute changes.MRI of brain done 7/30 which did not show any acute finding as well.   Patient was evaluated by physical therapy and she was able to walk.  PT recommended for  acute rehab.  She is in agreement to go to [facility].

## 2024-02-24 NOTE — Evaluation (Addendum)
 Occupational Therapy Evaluation Patient Details Name: [name] [name] MRN: [id number] DOB: 05-25-1951 Today's Date: 02/24/2024   History of Present Illness   73 y/o female presented to ED on 02/23/24 after being found down. Admitted for rhabdomyolysis and acute encephalopathy. PMH: stroke, hypothyroidism, GERD, dementia, CKD-3B, PE/DVT on Eliquis , neuropathy, urinary incontinence, breast cancer (s/p of right mastectomy, chemo and radiation therapy).     Clinical Impressions Pt was seen for PT/OT co-evaluation this date. PTA, pt resides in a one level home with 2 STE (railings on the back, but not the front reported). Lives with her spouse who is available 24/7. Pt is typically IND with ambulation community distances without AD and IND with ADLs and some household chores. Has a cleaning lady come in.   Pt presents to acute OT demonstrating impaired ADL performance and functional mobility 2/2 weakness, mild balance deficits, low activity tolerance, and lethargy. Pt initially very lethargic, eyes closed and only responding to painful stimuli. Pt was lifting into long sitting by PT/OT and opened her eyes and began answering questions. She became more alert throughout the session, but remained fatigued. Oriented to person, place and month, however stated the year as 2021. Pt later in the session asking where she was, but then able to state [facility] hospital in [place]. She required Min A for all bed mobility tasks this date. Required Max A for LB dressing. Increased time and cueing needed for safety and did not always follow instructions from therapists. She was able to stand from EOB to RW with Min A x2 via pulling on RW despite cues to push up from the EOB. Min A x2 for SPT to BSC, standing peri-care and lateral steps to return to [facility]. Pt was left seated upright in bed with her lunch tray and was able to open sour cream packet and place butter on her potato.  Pt would benefit from skilled OT services to  address noted impairments and functional limitations to maximize safety and independence while minimizing falls risk and caregiver burden. Do anticipate the need for follow up OT services upon acute hospital DC.      If plan is discharge home, recommend the following:   A little help with walking and/or transfers;A lot of help with bathing/dressing/bathroom;Assistance with cooking/housework;Help with stairs or ramp for entrance;Direct supervision/assist for medications management;Direct supervision/assist for financial management     Functional Status Assessment   Patient has had a recent decline in their functional status and demonstrates the ability to make significant improvements in function in a reasonable and predictable amount of time.     Equipment Recommendations   BSC/3in1;Other (comment) (defer to next venue)     Recommendations for Other Services   Rehab consult     Precautions/Restrictions   Precautions Precautions: Fall Recall of Precautions/Restrictions: Intact Restrictions Weight Bearing Restrictions Per Provider Order: No     Mobility Bed Mobility Overal bed mobility: Needs Assistance Bed Mobility: Supine to Sit, Sit to Supine     Supine to sit: Min assist, HOB elevated, Used rails Sit to supine: Min assist   General bed mobility comments: Min A for all bed mobility with cueing for hand placement and use of bed features    Transfers Overall transfer level: Needs assistance Equipment used: Rolling walker (2 wheels) Transfers: Sit to/from Stand, Bed to chair/wheelchair/BSC Sit to Stand: Min assist, +2 physical assistance, +2 safety/equipment     Step pivot transfers: Min assist, +2 safety/equipment, +2 physical assistance     General  transfer comment: Min A x2 for STS from EOB to RW, cues for hand placement but pt still pulls on RW for all stands-difficulty following commands; able to SPT to [facility] with Min A x2 for lines/leads management and  safety      Balance Overall balance assessment: Needs assistance Sitting-balance support: Feet supported Sitting balance-Leahy Scale: Fair Sitting balance - Comments: CGA at EOB   Standing balance support: Bilateral upper extremity supported, During functional activity Standing balance-Leahy Scale: Fair Standing balance comment: Min A x2 with RW use                           ADL either performed or assessed with clinical judgement   ADL Overall ADL's : Needs assistance/impaired                     Lower Body Dressing: Maximal assistance;Sitting/lateral leans Lower Body Dressing Details (indicate cue type and reason): to don bil socks Toilet Transfer: Minimal assistance;Rolling walker (2 wheels);+2 for physical assistance;+2 for safety/equipment Toilet Transfer Details (indicate cue type and reason): cueing for sequencing and safety with hand/feet placement, pt tries to sit on rail vs turning all the way around Toileting- Clothing Manipulation and Hygiene: Supervision/safety;Sitting/lateral lean;Contact guard assist Toileting - Clothing Manipulation Details (indicate cue type and reason): on [facility]             Vision         Perception         Praxis         Pertinent Vitals/Pain Pain Assessment Pain Assessment: No/denies pain     Extremity/Trunk Assessment Upper Extremity Assessment Upper Extremity Assessment: Generalized weakness   Lower Extremity Assessment Lower Extremity Assessment: Generalized weakness       Communication Communication Communication: No apparent difficulties   Cognition Arousal: Lethargic Behavior During Therapy: WFL for tasks assessed/performed               OT - Cognition Comments: able to state name, location, month and stated year as 2021 once awakened                 Following commands: Impaired Following commands impaired: Follows one step commands with increased time     Cueing  General  Comments   Cueing Techniques: Verbal cues;Tactile cues;Gestural cues  VSS throughout session on 2L 02 via [place]   Exercises     Shoulder Instructions      Home Living Family/patient expects to be discharged to:: Private residence Living Arrangements: Spouse/significant other Available Help at Discharge: Family;Available 24 hours/day Type of Home: House Home Access: Stairs to enter [employer] of Steps: 2 Entrance Stairs-Rails:  (unsure if has rails) Home Layout: One level     Bathroom Shower/Tub: Producer, television/film/video: Standard     Home Equipment: Cane - single point;Shower seat          Prior Functioning/Environment Prior Level of Function : Independent/Modified Independent;History of Falls (last six months)             Mobility Comments: IND community ambulator without AD; 1 fall leading to admission ADLs Comments: IND with ADLs, do some household chores and has a cleaning lady; goes out for meals often, husband drives her to appts    OT Problem List: Decreased strength;Decreased activity tolerance;Impaired balance (sitting and/or standing);Decreased safety awareness   OT Treatment/Interventions: Self-care/ADL training;Therapeutic exercise;Therapeutic activities;Energy conservation;DME and/or AE instruction;Patient/family education;Balance training  OT Goals(Current goals can be found in the care plan section)   Acute Rehab OT Goals Patient Stated Goal: go home OT Goal Formulation: With patient Time For Goal Achievement: 03/09/24 Potential to Achieve Goals: Good ADL Goals Pt Will Perform Lower Body Bathing: with min assist;sitting/lateral leans;sit to/from stand;with contact guard assist Pt Will Perform Lower Body Dressing: with contact guard assist;with min assist;sit to/from stand;sitting/lateral leans Pt Will Transfer to Toilet: ambulating;bedside commode;regular height toilet;with supervision;with contact guard assist   OT  Frequency:  Min 2X/week    Co-evaluation              AM-PAC OT 6 Clicks Daily Activity     Outcome Measure Help from another person eating meals?: None Help from another person taking care of personal grooming?: A Little Help from another person toileting, which includes using toliet, bedpan, or urinal?: A Little Help from another person bathing (including washing, rinsing, drying)?: A Little Help from another person to put on and taking off regular upper body clothing?: A Little Help from another person to put on and taking off regular lower body clothing?: A Lot 6 Click Score: 18   End of Session Equipment Utilized During Treatment: Rolling walker (2 wheels);Oxygen Nurse Communication: Mobility status  Activity Tolerance: Patient tolerated treatment well Patient left: in bed;with call bell/phone within reach;with bed alarm set  OT Visit Diagnosis: Other abnormalities of gait and mobility (R26.89);Muscle weakness (generalized) (M62.81);History of falling (Z91.81)                Time: 8558-8492 OT Time Calculation (min): 26 min Charges:  OT General Charges $OT Visit: 1 Visit OT Evaluation $OT Eval Moderate Complexity: 1 Mod Brixton Franko, OTR/L 02/24/24, 3:50 PM  Duwaine FORBES Saupe 02/24/2024, 3:49 PM

## 2024-02-24 NOTE — Progress Notes (Signed)
 Critical care note:  Date of note 02/24/2024  Subjective: The patient is blood pressure went down to 53/36 and she was not responding to sternal rub.  Rapid response team was called.  No reported fever or chills.  No reported nausea or vomiting.  No reported chest pain or palpitations.  Objective: Physical admission: Generally: Acutely ill elderly Caucasian female in no respiratory distress.  She was very somnolent and difficult to arouse. Vital signs: BP 53/36 with heart rate of 82 and respiratory rate of 15 temperature 97.9 pulse extremity 93% on room air but later on desatted to 89% on room air. Head - atraumatic, normocephalic.  Pupils - equal, pinpoint and round and sluggishly reactive to light and accommodation. Extraocular movements are intact. No scleral icterus.  Oropharynx - moist mucous membranes and tongue. No pharyngeal erythema or exudate.  Neck - supple. No JVD. Carotid pulses 2+ bilaterally. No carotid bruits. No palpable thyromegaly or lymphadenopathy. Cardiovascular - regular rate and rhythm. Normal S1 and S2. No murmurs, gallops or rubs.  Lungs -slightly diminished bibasilar breath sounds with poor respiratory effort.  Abdomen - soft and nontender. Positive bowel sounds. No palpable organomegaly or masses.  Extremities - no pitting edema, clubbing or cyanosis.  Neuro - grossly non-focal. Skin - no rashes. Breast, pelvic and rectal - deferred.  Labs and notes were reviewed. EG showed pH 7.36PCO2 of 41 and PO2 of 64 with HCO3 23.2 and O2 sat of 93.9% BMP revealed hypokalemia 3.1 with creatinine 1.37 better than previous levels.  Total CK was 3623 and CBC was unremarkable.  TSH was 1.88.  Code stroke head CT scan came back stable since yesterday with no acute intracranial hemorrhage or acute infarction.  It showed left scalp and periorbital hematomas.  Assessment/plan: 1.  Hypotension with associated unresponsiveness.  Differential diagnosis including syncope and TIA.  The  patient had pinpoint pupil that were concerning for pontine CVA. - Given negative stat code stroke head CT scan we will obtain a brain MRI. - 2D echo will be obtained for further assessment of possible syncope. - Will check orthostatics. -EKG showed we will continue other current plan of care.  Authorized and performed by: Madison Peaches, MD Total critical care time:    30    minutes. Due to a high probability of clinically significant, life-threatening deterioration, the patient required my highest level of preparedness to intervene emergently and I personally spent this critical care time directly and personally managing the patient.  This critical care time included obtaining a history, examining the patient, pulse oximetry, ordering and review of studies, arranging urgent treatment with development of management plan, evaluation of patient's response to treatment, frequent reassessment, and discussions with other providers. This critical care time was performed to assess and manage the high probability of imminent, life-threatening deterioration that could result in multiorgan failure.  It was exclusive of separately billable procedures and treating other patients and teaching time.

## 2024-02-24 NOTE — Significant Event (Addendum)
 Rapid Response Event Note   Reason for Call :  Hypotension and AMS  Initial Focused Assessment:  Patient not responsive to any kind of stimulation. Pupils pin point and not reactive. BP 58/42 HR 82 . Fluid bolus started. Blood glucose  113.  O2 98% on RA. Manual BP 80/44. Dr Lawence at bedside and provider called a code stroke. LKW 0230. Patient began desating into the 65s code blue called due to patient unable to protect airway. ED Provider arrived to room and attempted to intubate patient woke up. Beagan following commands BP 111/57 HR 87 O2 100% on 2 L. Patient then transported to CT and ICU. On arrival to ICU patient intermittently lethargic and BP stable 117/59 (76) Patient will awaked to answer questions then fall back to sleep DR [facility] aware.     Interventions:  -Fluids -CT   MD Notified: Dr Lawence Call Upfz:[id number] Arrival Upfz:9554 End Time:0600  Lesley LOISE Shams, RN

## 2024-02-24 NOTE — Progress Notes (Signed)
 At [employer] went in patient room to check on her and get a orthostatic blood pressure, patient was sleeping and would not be able to get patient to stand or sit on the side of the bed. Charge nurse notified. At 0400 writer was in patient chart and notice her blood pressure was 53/36. Writer told charge nurse and went to patient room she was in deep sleep. Blood pressure was recheck 58/42 Rapid response was called MD Mansy notified Called her name no response, sternal rub was done she did not respond Neuro checks negative. Would not open eyes. MD, RRT at the bedside, Code blue called.[id number] started responding taken for CT and then to ICU. Report given to [facility] ICU nurse.

## 2024-02-24 NOTE — Progress Notes (Signed)
 Progress Note   Patient: [name] [name] DOB: 20-Dec-1950 DOA: 02/23/2024     0 DOS: the patient was seen and examined on 02/24/2024   Brief hospital course: [name] [name] is a 73 y.o. female with medical history significant of stroke, hypothyroidism, GERD, dementia, CKD-3B, PE/DVT on Eliquis , neuropathy, urinary incontinence, breast cancer (s/p of right mastectomy, chemo and radiation therapy), who presents with fall and syncope.  Per the son, patient only has a mild aphasia, was walking without assist before.  She had a fall prior to her admission to the hospital. Upon arriving to hospital, patient had hyponatremia at 127, metabolic acidosis of 18, rhabdomyolysis of 3623. In the morning of 7/29, patient mental status seem to be worsening, she only respond to painful stimuli.  Has been seen by neurology, CT head did not show any acute changes.   Principal Problem:   Rhabdomyolysis Active Problems:   Fall at home, initial encounter   possible syncope   Hyponatremia   Acquired hypothyroidism   HLD (hyperlipidemia)   Pulmonary embolism (HCC)   DVT (deep venous thrombosis) (HCC)   Stroke (HCC)   Chronic kidney disease, stage 3b (HCC)   Overweight (BMI 25.0-29.9)   Assessment and Plan: Acute encephalopathy. Likely brain contusion. Patient mental status much worse today, she only responds to painful stimuli. Airway intact. Etiology of encephalopathy is unclear at this time, due to severe elevation of CK level, patient could have a significant brain contusion/concussion. Patient has been seen by neurology.  CT head did not show any acute changes, no bleeding. Another possibility is due to polypharmacy.  Most home medicine has been discontinued. Patient also had a history of breast cancer, but has been in remission for 5 years.  Given patient has no recent neurologic changes before the fall, unlikely to have metastasis causing mental status changes.  Will leave that decision  for a brain MRI to neurology. Due to severe encephalopathy, patient prognosis is guarded.  Will continue to follow in ICU stepdown unit.  Fall at home, possible syncope. Severe rhabdomyolysis secondary to fall. Replace the potassium, continue IV fluids.  Echocardiogram obtained, continue telemetry. Patient could have had a syncope episode, if patient condition improves, she will need a ZIO recorder at discharge.  Chronic kidney disease stage IIIb. Hyponatremia Hypokalemia. Continue fluids.  Replete potassium.  History of pulm emboli and DVT. Eliquis  on hold, not able to take p.o., will continue holding until mental status is better.  Overweight BMI 27.62.  History of stroke. Does not seem to have a new stroke.  Patient being seen by neurology.    Subjective:  Patient very drowsy, only responsive to painful stimuli.  Physical Exam: Vitals:   02/23/24 2340 02/24/24 0435 02/24/24 0450 02/24/24 0515  BP: 115/86 (!) 53/36 111/74 (!) 105/52  Pulse: 72 82    Resp: 16 15    Temp: 98.5 F (36.9 C) 97.9 F (36.6 C)    TempSrc: Oral Oral    SpO2: 100% 93%    Weight:      Height:       General exam: Ill-appearing, overweight. Respiratory system: Decreased breath sounds. Respiratory effort normal. Cardiovascular system: S1 & S2 heard, RRR. No JVD, murmurs, rubs, gallops or clicks. No pedal edema. Gastrointestinal system: Abdomen is nondistended, soft and nontender. No organomegaly or masses felt. Normal bowel sounds heard. Central nervous system: Very drowsy, unresponsive to painful stimuli. Extremities: Symmetric 5 x 5 power. Skin: No rashes, lesions or ulcers  Data Reviewed:  Reviewed CT scan results, all lab results.  Family Communication: Son updated over the phone.  Disposition: Status is: Observation The patient will require care spanning > 2 midnights and should be moved to inpatient because: Severity of disease, IV treatment, altered mental status.  CRITICAL  CARE Performed by: Murvin Mana   Total critical care time: 55 minutes  Critical care time was exclusive of separately billable procedures and treating other patients.  Critical care was necessary to treat or prevent imminent or life-threatening deterioration.  Critical care was time spent personally by me on the following activities: development of treatment plan with patient and/or surrogate as well as nursing, discussions with consultants, evaluation of patient's response to treatment, examination of patient, obtaining history from patient or surrogate, ordering and performing treatments and interventions, ordering and review of laboratory studies, ordering and review of radiographic studies, pulse oximetry and re-evaluation of patient's condition.      Author: Murvin Mana, MD 02/24/2024 10:41 AM  For on call review [email or web address].

## 2024-02-24 NOTE — Evaluation (Signed)
 Physical Therapy Evaluation Patient Details Name: [name] [name] MRN: [id number] DOB: 1951/05/14 Today's Date: 02/24/2024  History of Present Illness  73 y/o female presented to ED on 02/23/24 after being found down. Admitted for rhabdomyolysis and acute encephalopathy. PMH: stroke, hypothyroidism, GERD, dementia, CKD-3B, PE/DVT on Eliquis , neuropathy, urinary incontinence, breast cancer (s/p of right mastectomy, chemo and radiation therapy).   Clinical Impression  Patient admitted with the above. PTA, patient lives with husband and was independent with no AD and hx of 1 fall which led to this admission. Patient initially stuporous and only withdrawing from painful stimuli. Once assisted away from support of bed into long sitting, patient became more alert but still lethargic. Able to follow one step commands but required frequent cueing. A&Ox3 during session. Required minA for bed mobility. Stood from EOB and BSC with minA+2 and RW, cueing for hand placement. Able to transfer to/from [facility] with minA+2 and RW then take sidesteps towards [facility] with similar assist. Placed patient in modified chair position at end of session to partake in lunch that had previously arrived when stuporous. Patient will benefit from skilled PT services during acute stay to address listed deficits. Patient will benefit from ongoing therapy at discharge to maximize functional independence and safety.       If plan is discharge home, recommend the following: A little help with walking and/or transfers;A little help with bathing/dressing/bathroom;Assistance with cooking/housework;Assist for transportation;Help with stairs or ramp for entrance   Can travel by private vehicle        Equipment Recommendations Rolling Tetsuo Coppola (2 wheels);BSC/3in1  Recommendations for Other Services  Rehab consult    Functional Status Assessment Patient has had a recent decline in their functional status and demonstrates the ability to make  significant improvements in function in a reasonable and predictable amount of time.     Precautions / Restrictions Precautions Precautions: Fall Recall of Precautions/Restrictions: Intact Restrictions Weight Bearing Restrictions Per Provider Order: No      Mobility  Bed Mobility Overal bed mobility: Needs Assistance Bed Mobility: Supine to Sit, Sit to Supine     Supine to sit: Min assist, HOB elevated, Used rails Sit to supine: Min assist   General bed mobility comments: Min A for all bed mobility with cueing for hand placement and use of bed features    Transfers Overall transfer level: Needs assistance Equipment used: Rolling Neale Marzette (2 wheels) Transfers: Sit to/from Stand, Bed to chair/wheelchair/BSC Sit to Stand: Min assist, +2 physical assistance, +2 safety/equipment   Step pivot transfers: Min assist, +2 safety/equipment, +2 physical assistance       General transfer comment: Min A x2 for STS from EOB to RW, cues for hand placement but pt still pulls on RW for all stands-difficulty following commands; able to SPT to [facility] with Min A x2 for lines/leads management and safety    Ambulation/Gait                  Stairs            Wheelchair Mobility     Tilt Bed    Modified Rankin (Stroke Patients Only)       Balance Overall balance assessment: Needs assistance Sitting-balance support: Feet supported Sitting balance-Leahy Scale: Fair     Standing balance support: Bilateral upper extremity supported, During functional activity Standing balance-Leahy Scale: Fair  Pertinent Vitals/Pain Pain Assessment Pain Assessment: No/denies pain    Home Living Family/patient expects to be discharged to:: Private residence Living Arrangements: Spouse/significant other Available Help at Discharge: Family;Available 24 hours/day Type of Home: House Home Access: Stairs to enter Entrance Stairs-Rails:  (unsure if  has rails) Entrance Stairs-Number of Steps: 2   Home Layout: One level Home Equipment: Cane - single point;Shower seat      Prior Function Prior Level of Function : Independent/Modified Independent;History of Falls (last six months)             Mobility Comments: IND community ambulator without AD; 1 fall leading to admission ADLs Comments: IND with ADLs, do some household chores and has a cleaning lady; goes out for meals often, husband drives her to appts     Extremity/Trunk Assessment   Upper Extremity Assessment Upper Extremity Assessment: Generalized weakness    Lower Extremity Assessment Lower Extremity Assessment: Generalized weakness       Communication   Communication Communication: No apparent difficulties    Cognition Arousal: Lethargic Behavior During Therapy: WFL for tasks assessed/performed   PT - Cognitive impairments: No family/caregiver present to determine baseline                       PT - Cognition Comments: initially responding to only painful stimuli to all 4 extremities with withdrawal. Eyes held open with no tracking noted. Assisted patient into long sitting which improved mentation and began to respond to verbal stimuli and carry conversation. Oriented to person, place, situation, and month but not year Following commands: Impaired Following commands impaired: Follows one step commands with increased time     Cueing Cueing Techniques: Verbal cues, Tactile cues, Gestural cues     General Comments General comments (skin integrity, edema, etc.): VSS throughout session on 2L 02 via [place]    Exercises     Assessment/Plan    PT Assessment Patient needs continued PT services  PT Problem List Decreased strength;Decreased activity tolerance;Decreased mobility;Decreased balance;Decreased coordination;Decreased knowledge of use of DME;Decreased cognition;Decreased safety awareness;Decreased knowledge of precautions;Cardiopulmonary status  limiting activity       PT Treatment Interventions DME instruction;Gait training;Functional mobility training;Therapeutic activities;Therapeutic exercise;Balance training;Neuromuscular re-education;Patient/family education    PT Goals (Current goals can be found in the Care Plan section)  Acute Rehab PT Goals Patient Stated Goal: to get stronger PT Goal Formulation: With patient Time For Goal Achievement: 03/09/24 Potential to Achieve Goals: Good    Frequency Min 3X/week     Co-evaluation               AM-PAC PT 6 Clicks Mobility  Outcome Measure Help needed turning from your back to your side while in a flat bed without using bedrails?: A Little Help needed moving from lying on your back to sitting on the side of a flat bed without using bedrails?: A Little Help needed moving to and from a bed to a chair (including a wheelchair)?: A Lot Help needed standing up from a chair using your arms (e.g., wheelchair or bedside chair)?: A Lot Help needed to walk in hospital room?: A Lot Help needed climbing 3-5 steps with a railing? : Total 6 Click Score: 13    End of Session Equipment Utilized During Treatment: Oxygen Activity Tolerance: Patient tolerated treatment well Patient left: in bed;with call bell/phone within reach;with bed alarm set Nurse Communication: Mobility status PT Visit Diagnosis: Unsteadiness on feet (R26.81);History of falling (Z91.81);Muscle weakness (generalized) (M62.81);Other abnormalities  of gait and mobility (R26.89)    Time: 8559-8492 PT Time Calculation (min) (ACUTE ONLY): 27 min   Charges:   PT Evaluation $PT Eval Moderate Complexity: 1 Mod   PT General Charges $$ ACUTE PT VISIT: 1 Visit         Maryanne Finder, PT, DPT Physical Therapist - [facility]  [facility]   Kebrina Friend A Pluma Diniz 02/24/2024, 3:51 PM

## 2024-02-24 NOTE — Consult Note (Signed)
 [facility] Tele[facility]urology Consult Services   Patient Name:   [name] [name], [name] [name] Date of Birth:   [birth year] Identification Number:   MRN - [id number] Date of Service:   02/24/2024 05:33:46  Diagnosis:       G93.49 - Encephalopathy Multifactorial  Impression:      Patient is a 73 year old lady history of stroke, PE DVT on Eliquis  who was admitted with fall and syncope.    Assessment  Encephalopathy  Likely related to hypotension    - Treat Underlying Infection, severe electrolyte, acid base, endocrine or circulatory disturbances  - Avoid sedating medication  - Core temp > 36 degrees celsius  - MAP 65 - 70  - Institute appropriate supportive measures  - Adequate hydration and oxygenation, minimize physical restraints  - Orienting stimuli to avoid day and night confusion  - If symptoms do no resolve consider MRI brain      Our recommendations are outlined below.  Recommendations:        Stroke/Telemetry Floor       Neuro Checks (Q2)       Bedside Swallow Eval       DVT Prophylaxis       IV Fluids, Normal Saline       Head of Bed 30 Degrees       Euglycemia and Avoid Hyperthermia (PRN Acetaminophen )  Sign Out:       Discussed with Rapid Response Team    ------------------------------------------------------------------------------  Advanced Imaging: Advanced Imaging Deferred because:  Stroke not suspected with clinical presentation and exam   Metrics: Last Known Well: 02/24/2024 02:30:05 Dispatch Time: 02/24/2024 05:33:46 Initial Response Time: 02/24/2024 05:35:37 Symptoms: hard to arouse. Initial patient interaction: 02/24/2024 05:35:44 NIHSS Assessment Completed: 02/24/2024 05:51:40 Patient is not a candidate for Thrombolytic. Thrombolytic Medical Decision: 02/24/2024 05:51:42 Patient was not deemed candidate for Thrombolytic because of following reasons: other diagnosis suspected Hypotension----.  CT Head: CT head unremarkable for acute  infarction or hemorrhage per Radiology: ------------------------------------  Primary Provider Notified of Diagnostic Impression and Management Plan on: 02/24/2024 05:53:13 Spoke With: Rapid Response team Able to Reach 02/24/2024 05:53:13    ------------------------------------------------------------------------------  History of Present Illness: Patient is a 73 year old Female.  Inpatient stroke alert was called for symptoms of hard to arouse. Patient is a 73 year old lady history of stroke, PE DVT on Eliquis  who was admitted with fall and syncope. Neurology consulted because patient is hard to arouse. She was last seen normal at 2:30 AM. Nurse noticed that her blood pressure was 53/36. When patient was assessed, she was difficult to arouse. CT has been done which is negative. Neurology is consulted to send management.    Past Medical History:      Hyperlipidemia      There is no history of Hypertension  Medications:  Anticoagulant use:  Yes Eliquis  No Antiplatelet use Reviewed EMR for current medications  Allergies:  Reviewed  Social History: Smoking: No Alcohol Use: No Drug Use: No  Family History:  There is no family history of premature cerebrovascular disease pertinent to this consultation  ROS : 14 Points Review of Systems was performed and was negative except mentioned in HPI.  Past Surgical History: There Is No Surgical History Contributory To Today's Visit     Examination: BP(53/36), Pulse(82), 1A: Level of Consciousness - Arouses to minor stimulation + 1 1B: Ask Month and Age - Could Not Answer Either Question Correctly + 2 1C: Blink Eyes & Squeeze Hands - Performs Both Tasks + 0 2:  Test Horizontal Extraocular Movements - Normal + 0 3: Test Visual Fields - No Visual Loss + 0 4: Test Facial Palsy (Use Grimace if Obtunded) - Normal symmetry + 0 5A: Test Left Arm Motor Drift - Some Effort Against Gravity + 2 5B: Test Right Arm Motor Drift - Some  Effort Against Gravity + 2 6A: Test Left Leg Motor Drift - Some Effort Against Gravity + 2 6B: Test Right Leg Motor Drift - Some Effort Against Gravity + 2 7: Test Limb Ataxia (FNF/Heel-Shin) - No Ataxia + 0 8: Test Sensation - Normal; No sensory loss + 0 9: Test Language/Aphasia - Mild-Moderate Aphasia: Some Obvious Changes, Without Significant Limitation + 1 10: Test Dysarthria - Mild-Moderate Dysarthria: Slurring but can be understood + 1 11: Test Extinction/Inattention - No abnormality + 0  NIHSS Score: 13   Pre-Morbid Modified Rankin Scale: Unable to assess  Spoke with : Rapid Response team I reviewed the available imaging via Rapid and initiated discussion with the primary provider  This consult was conducted in real time using interactive audio and Immunologist. Patient was informed of the technology being used for this visit and agreed to proceed. Patient located in hospital and provider located at home/office setting.   Patient is being evaluated for possible acute neurologic impairment and high probability of imminent or life-threatening deterioration. I spent total of 35 minutes providing care to this patient, including time for face to face visit via telemedicine, review of medical records, imaging studies and discussion of findings with providers, the patient and/or family.   Dr Marzetta Lemmings   [facility] For Inpatient follow-up with [facility] physician please call [facility] at [phone]. As we are not an outpatient service for any post hospital discharge needs please contact the hospital for assistance. If you have any questions for the [facility] physicians or need to reconsult for clinical or diagnostic changes please contact us  via [facility] at [phone].

## 2024-02-24 NOTE — Progress Notes (Signed)
 Rapid Response called. Upon RT arrival, patient was hypotensive and not responding to verbal stimuli or sternal rub. Respirations were equal, but shallow. SpO2 was 97% on room air. ABG ordered and resulted. MD and ICU charge called Code Blue for intubation. ED MD arrived. Patient became responsive and was opening eyes. Patient also confirmed her name. Intubation was cancelled, and patient was placed on 2.5L nasal cannula. Will continue to monitor.

## 2024-02-25 ENCOUNTER — Inpatient Hospital Stay

## 2024-02-25 DIAGNOSIS — T796XXA Traumatic ischemia of muscle, initial encounter: Secondary | ICD-10-CM | POA: Diagnosis not present

## 2024-02-25 LAB — CBC
HCT: 37.9 % (ref 36.0–46.0)
Hemoglobin: 12.2 g/dL (ref 12.0–15.0)
MCH: 26.9 pg (ref 26.0–34.0)
MCHC: 32.2 g/dL (ref 30.0–36.0)
MCV: 83.5 fL (ref 80.0–100.0)
Platelets: 150 K/uL (ref 150–400)
RBC: 4.54 MIL/uL (ref 3.87–5.11)
RDW: 15.9 % — ABNORMAL HIGH (ref 11.5–15.5)
WBC: 5.8 K/uL (ref 4.0–10.5)
nRBC: 0 % (ref 0.0–0.2)

## 2024-02-25 LAB — BASIC METABOLIC PANEL WITH GFR
Anion gap: 8 (ref 5–15)
BUN: 15 mg/dL (ref 8–23)
CO2: 21 mmol/L — ABNORMAL LOW (ref 22–32)
Calcium: 8.2 mg/dL — ABNORMAL LOW (ref 8.9–10.3)
Chloride: 113 mmol/L — ABNORMAL HIGH (ref 98–111)
Creatinine, Ser: 1.14 mg/dL — ABNORMAL HIGH (ref 0.44–1.00)
GFR, Estimated: 51 mL/min — ABNORMAL LOW (ref >60)
Glucose, Bld: 83 mg/dL (ref 70–99)
Potassium: 3.7 mmol/L (ref 3.5–5.1)
Sodium: 142 mmol/L (ref 135–145)

## 2024-02-25 LAB — GLUCOSE, CAPILLARY
Glucose-Capillary: 71 mg/dL (ref 70–99)
Glucose-Capillary: 77 mg/dL (ref 70–99)

## 2024-02-25 LAB — PHOSPHORUS: Phosphorus: 1.9 mg/dL — ABNORMAL LOW (ref 2.5–4.6)

## 2024-02-25 LAB — CK: Total CK: 2443 U/L — ABNORMAL HIGH (ref 38–234)

## 2024-02-25 LAB — MAGNESIUM: Magnesium: 2 mg/dL (ref 1.7–2.4)

## 2024-02-25 NOTE — Progress Notes (Signed)
 Progress Note   Patient: [name] [name] FMW:[id number] DOB: 1951-03-05 DOA: 02/23/2024     1 DOS: the patient was seen and examined on 02/25/2024   Brief hospital course: [name] [name] is a 73 y.o. female with medical history significant of stroke, hypothyroidism, GERD, dementia, CKD-3B, PE/DVT on Eliquis , neuropathy, urinary incontinence, breast cancer (s/p of right mastectomy, chemo and radiation therapy), who presents with fall and syncope.  Per the son, patient only has a mild aphasia, was walking without assist before.  She had a fall prior to her admission to the hospital. Upon arriving to hospital, patient had hyponatremia at 127, metabolic acidosis of 18, rhabdomyolysis of 3623. In the morning of 7/29, patient mental status seem to be worsening, she only respond to painful stimuli.  Has been seen by neurology, CT head did not show any acute changes.  Patient was evaluated by physical therapy and she was able to walk.  PT recommended for possible acute rehab.  However today, patient said she is feeling better and she was able to walk with physical therapy and wanted to go home.  Assessment and Plan:  Acute encephalopathy. Likely brain contusion. Patient mental status improved, she is alert awake and oriented Will get MRI of the brain to make sure she does not have any concussion  Fall at home, possible syncope. Severe rhabdomyolysis secondary to fall. Replace the potassium, continue IV fluids.  Echocardiogram obtained, continue telemetry. Patient could have had a syncope episode, if patient condition improves, she will need a ZIO recorder at discharge.   Chronic kidney disease stage IIIb. Hyponatremia, sodium improved today at 142 Hypokalemia resolved, today at 3.7 Continue fluids.  Replete potassium.   History of pulm emboli and DVT. Start back on Eliquis  7/30.   Overweight BMI 27.62.   History of stroke. Does not seem to have a new stroke.  Patient being seen by  neurology.         Subjective: Alert awake and oriented denies any symptoms  Physical Exam: Vitals:   02/25/24 0500 02/25/24 0600 02/25/24 0700 02/25/24 0800  BP: (!) 149/58 (!) 162/68 (!) 161/65 (!) 153/61  Pulse: 72 69 80 73  Resp: 15 13 20    Temp:   98.7 F (37.1 C)   TempSrc:      SpO2: 100% 99% 100% 100%  Weight:      Height:       Constitutional: Alert, awake, calm, comfortable HEENT: Neck supple, bilateral raccoon's eyes Respiratory: clear to auscultation bilaterally, no wheezing, no crackles. Normal respiratory effort. No accessory muscle use.  Cardiovascular: Regular rate and rhythm, no murmurs / rubs / gallops. No extremity edema. 2+ pedal pulses. No carotid bruits.  Abdomen: no tenderness, no masses palpated. No hepatosplenomegaly. Bowel sounds positive.  Musculoskeletal: no clubbing / cyanosis. No joint deformity upper and lower extremities. Good ROM, no contractures. Normal muscle tone.  Skin: no rashes, lesions, ulcers. No induration Neurologic: CN 2-12 grossly intact. Sensation intact, DTR normal. Strength 5/5 x all 4 extremities.  Psychiatric: Normal judgment and insight. Alert and oriented x 3. Normal mood.   Data Reviewed:  Sodium 142 potassium 3.7 white count 5.8 hemoglobin 12.2  Family Communication: Patient, no family member was around  Disposition: Status is: Inpatient Remains inpatient appropriate because: Ongoing recovery from acute change in mental status and fall  Planned Discharge Destination: Home with Home Health versus acute rehab    Time spent: 35  minutes  Author: Nena Rebel, MD 02/25/2024 1:03 PM  For on call review [email or web address].

## 2024-02-25 NOTE — TOC Progression Note (Signed)
 Transition of Care [facility]) - Progression Note    Patient Details  Name: [name] [name] MRN: [id number] Date of Birth: 1951/01/18  Transition of Care [facility]) CM/SW Contact  K'La JINNY Ruts, LCSW Phone Number: 02/25/2024, 3:34 PM  Clinical Narrative:    Chart reviewed. Provider recommended inpatient rehab. I spoke with the client about back SNF option. The patient receptive and a list was provided. I will follow up with the patient if the inpatient rehab does not have available bed offers.                      Expected Discharge Plan and Services                                               Social Drivers of Health (SDOH) Interventions SDOH Screenings   Food Insecurity: No Food Insecurity (02/23/2024)  Housing: Low Risk  (02/23/2024)  Transportation Needs: No Transportation Needs (02/23/2024)  Utilities: Not At Risk (02/23/2024)  Financial Resource Strain: Low Risk  (12/10/2023)   Received from [facility]  Physical Activity: Unknown (05/28/2017)   Received from [facility]  Social Connections: Socially Integrated (02/23/2024)  Tobacco Use: Low Risk  (02/23/2024)    Readmission Risk Interventions     No data to display

## 2024-02-25 NOTE — Plan of Care (Signed)
  Problem: Education: Goal: Knowledge of General Education information will improve Description: Including pain rating scale, medication(s)/side effects and non-pharmacologic comfort measures Outcome: Progressing   Problem: Health Behavior/Discharge Planning: Goal: Ability to manage health-related needs will improve Outcome: Progressing   Problem: Clinical Measurements: Goal: Ability to maintain clinical measurements within normal limits will improve Outcome: Progressing Goal: Will remain free from infection Outcome: Progressing Goal: Diagnostic test results will improve Outcome: Progressing Goal: Respiratory complications will improve Outcome: Progressing   Problem: Nutrition: Goal: Adequate nutrition will be maintained Outcome: Progressing   Problem: Coping: Goal: Level of anxiety will decrease Outcome: Progressing   Problem: Elimination: Goal: Will not experience complications related to urinary retention Outcome: Progressing   Problem: Safety: Goal: Ability to remain free from injury will improve Outcome: Progressing   Problem: Skin Integrity: Goal: Risk for impaired skin integrity will decrease Outcome: Progressing

## 2024-02-25 NOTE — ED Provider Notes (Signed)
 Department of Emergency Medicine CODE BLUE CONSULTATION  02/24/2024 5:30 AM  Code Blue CONSULT NOTE  Chief Complaint: Cardiac arrest/unresponsive   Level V Caveat: Unresponsive  History of present illness: I was contacted by the hospital for a CODE BLUE cardiac arrest upstairs and presented to the patient's bedside.    Patient was found unresponsive, hypotensive and a CODE BLUE was called for intubation for airway protection by the hospitalist.  No cardiac arrest.  Patient was not receiving CPR.  She was being ventilated through BVM.  ROS: Unable to obtain, Level V caveat  Scheduled Meds:  apixaban   5 mg Oral BID   Chlorhexidine  Gluconate Cloth  6 each Topical Daily   cholecalciferol   5,000 Units Oral q morning   cyanocobalamin   500 mcg Oral q morning   levothyroxine   125 mcg Oral Q0600   memantine   5 mg Oral BID   pantoprazole   40 mg Oral Daily   sodium chloride   1 g Oral BID WC   Continuous Infusions:  0.9 % NaCl with KCl 20 mEq / L 75 mL/hr at 02/25/24 0200   norepinephrine  (LEVOPHED ) Adult infusion Stopped (02/24/24 1310)   PRN Meds:.acetaminophen , oxyCODONE -acetaminophen  Past Medical History:  Diagnosis Date   Anxiety    Cancer (HCC)    2006 right side breast ca. lumpectomy, skin ca on same breast    GERD (gastroesophageal reflux disease)    Hypercholesteremia    Peripheral neuropathic pain    Personal history of chemotherapy    2006/2014   Personal history of radiation therapy    2006/2014   Pulmonary emboli [facility])    Urinary incontinence    Past Surgical History:  Procedure Laterality Date   APPENDECTOMY     CARDIAC CATHETERIZATION Left 05/23/2016   Procedure: Left Heart Cath and Coronary Angiography;  Surgeon: Marsa Dooms, MD;  Location: [facility] INVASIVE CV LAB;  Service: Cardiovascular;  Laterality: Left;   IVC FILTER INSERTION N/A 08/19/2018   Procedure: IVC FILTER INSERTION;  Surgeon: Marea Selinda RAMAN, MD;  Location: [facility] INVASIVE CV LAB;  Service:  Cardiovascular;  Laterality: N/A;   MASTECTOMY Right 2006/2014   rt breast removal     cancer   Social History   Socioeconomic History   Marital status: Married    Spouse name: Not on file   Number of children: Not on file   Years of education: Not on file   Highest education level: Not on file  Occupational History   Not on file  Tobacco Use   Smoking status: Never   Smokeless tobacco: Never  Vaping Use   Vaping status: Never Used  Substance and Sexual Activity   Alcohol use: Never   Drug use: No   Sexual activity: Not on file  Other Topics Concern   Not on file  Social History Narrative   Not on file   Social Drivers of Health   Financial Resource Strain: Low Risk  (12/10/2023)   Received from [facility]   Overall Financial Resource Strain (CARDIA)    Difficulty of Paying Living Expenses: Not hard at all  Food Insecurity: No Food Insecurity (02/23/2024)   Hunger Vital Sign    Worried About Running Out of Food in the Last Year: Never true    Ran Out of Food in the Last Year: Never true  Transportation Needs: No Transportation Needs (02/23/2024)   PRAPARE - Administrator, Civil Service (Medical): No    Lack of Transportation (Non-Medical):  No  Physical Activity: Unknown (05/28/2017)   Received from [facility]   Exercise Vital Sign    Days of Exercise per Week: 0 days    Minutes of Exercise per Session: Not on file  Stress: Not on file  Social Connections: Socially Integrated (02/23/2024)   Social Connection and Isolation Panel    Frequency of Communication with Friends and Family: More than three times a week    Frequency of Social Gatherings with Friends and Family: Three times a week    Attends Religious Services: More than 4 times per year    Active Member of Clubs or Organizations: Yes    Attends Banker Meetings: More than 4 times per year    Marital Status: Married  Catering manager Violence: Not  At Risk (02/23/2024)   Humiliation, Afraid, Rape, and Kick questionnaire    Fear of Current or Ex-Partner: No    Emotionally Abused: No    Physically Abused: No    Sexually Abused: No   No Known Allergies  Last set of Vital Signs (not current) Vitals:   02/25/24 0412 02/25/24 0500  BP: (!) 152/63 (!) 149/58  Pulse: 78 72  Resp: 15 15  Temp: 97.6 F (36.4 C)   SpO2: 100% 100%      Physical Exam  Gen: Elderly patient lying unresponsive in bed but when I went to intubate her patient yelled out and was able to open her eyes, answer questions Cardiovascular: Regular rate rhythm Resp: No respiratory distress, breathing on her own but being ventilated by BVM when I enter the room Abd: nondistended  Neuro: Patient able to answer questions, open eyes HEENT: No blood in posterior pharynx Neck: No crepitus  Musculoskeletal: No deformity  Skin: warm  Procedures     Medical Decision making  I was called to a CODE BLUE upstairs for patient that was unresponsive, hypotensive and being ventilated by BVM.  Called for intubation for airway protection.  When I went to intubate the patient she immediately began yelling out at me and was able to open her eyes, answer her name.  Given GCS greater than 8 and no actual hypoxia, respiratory distress or cardiac arrest, decision was made to hold on intubation by myself and the hospitalist Dr. Lawence.  Please see hospitalist note for further information.      Yu Peggs, Josette SAILOR, [place] 02/25/24 [phone]

## 2024-02-25 NOTE — Progress Notes (Signed)
 Physical Therapy Treatment Patient Details Name: ZILPHIA KOZINSKI MRN: [id number] DOB: [birth year] Today's Date: 02/25/2024   History of Present Illness 73 y/o female presented to ED on 02/23/24 after being found down. Admitted for rhabdomyolysis and acute encephalopathy. PMH: stroke, hypothyroidism, GERD, dementia, CKD-3B, PE/DVT on Eliquis , neuropathy, urinary incontinence, breast cancer (s/p of right mastectomy, chemo and radiation therapy).    PT Comments  Patient received in bed, she is alert and pleasant. Patient wondering where her shoes are. Not in room. RN notified. She requires min A for bed mobility and transfers. Pivoted to [facility] with cga/min A and then ambulated 150 feet with RW and cga. No LOB, no difficulties reported during session other than some soreness. Patient reports she is feeling better. She will continue to benefit from skilled PT to improve strength, safety and independence. Patient lives with husband at home who is also hospitalized at this time. Reports she has children who can assist.       If plan is discharge home, recommend the following: A little help with walking and/or transfers;A little help with bathing/dressing/bathroom;Assistance with cooking/housework;Assist for transportation;Help with stairs or ramp for entrance   Can travel by private vehicle      yes  Equipment Recommendations  None recommended by PT    Recommendations for Other Services       Precautions / Restrictions Precautions Precautions: Fall Recall of Precautions/Restrictions: Intact Restrictions Weight Bearing Restrictions Per Provider Order: No     Mobility  Bed Mobility Overal bed mobility: Needs Assistance Bed Mobility: Supine to Sit     Supine to sit: Min assist, HOB elevated, Used rails          Transfers Overall transfer level: Needs assistance Equipment used: Rolling walker (2 wheels) Transfers: Sit to/from Stand, Bed to chair/wheelchair/BSC Sit to Stand: Min assist    Step pivot transfers: Min assist       General transfer comment: min A for sit to stand transfers. Cues for hand placement, but does not correct.    Ambulation/Gait Ambulation/Gait assistance: Contact guard assist Gait Distance (Feet): 150 Feet Assistive device: Rolling walker (2 wheels) Gait Pattern/deviations: Step-through pattern, Decreased stride length Gait velocity: decr     General Gait Details: patient ambulated around unit. No lob, slowed pace   Stairs             Wheelchair Mobility     Tilt Bed    Modified Rankin (Stroke Patients Only)       Balance Overall balance assessment: Needs assistance Sitting-balance support: Feet supported Sitting balance-Leahy Scale: Good     Standing balance support: Bilateral upper extremity supported, During functional activity, Reliant on assistive device for balance Standing balance-Leahy Scale: Good Standing balance comment: able to stand to don and doff briefs with cga.                            Communication Communication Communication: No apparent difficulties  Cognition Arousal: Alert Behavior During Therapy: WFL for tasks assessed/performed   PT - Cognitive impairments: Memory                       PT - Cognition Comments: patient alert, talkative this session, however repeating herself throughout Following commands: Intact Following commands impaired: Only follows one step commands consistently    Cueing Cueing Techniques: Verbal cues  Exercises      General Comments  Pertinent Vitals/Pain Pain Assessment Pain Assessment: Faces Faces Pain Scale: Hurts a little bit Pain Location: generalized pain from fall Pain Descriptors / Indicators: Discomfort, Sore Pain Intervention(s): Monitored during session, Repositioned    Home Living                          Prior Function            PT Goals (current goals can now be found in the care plan section)  Acute Rehab PT Goals Patient Stated Goal: to get stronger, go home PT Goal Formulation: With patient Time For Goal Achievement: 03/09/24 Potential to Achieve Goals: Good Progress towards PT goals: Progressing toward goals    Frequency    Min 3X/week      PT Plan      Co-evaluation              AM-PAC PT 6 Clicks Mobility   Outcome Measure  Help needed turning from your back to your side while in a flat bed without using bedrails?: A Little Help needed moving from lying on your back to sitting on the side of a flat bed without using bedrails?: A Little Help needed moving to and from a bed to a chair (including a wheelchair)?: A Little Help needed standing up from a chair using your arms (e.g., wheelchair or bedside chair)?: A Little Help needed to walk in hospital room?: A Little Help needed climbing 3-5 steps with a railing? : A Lot 6 Click Score: 17    End of Session   Activity Tolerance: Patient tolerated treatment well Patient left: in chair;with call bell/phone within reach Nurse Communication: Mobility status;Other (comment) (patient wanted to leave pure wick out for now.) PT Visit Diagnosis: Muscle weakness (generalized) (M62.81);History of falling (Z91.81)     Time: 9079-9058 PT Time Calculation (min) (ACUTE ONLY): 21 min  Charges:    $Gait Training: 8-22 mins PT General Charges $$ ACUTE PT VISIT: 1 Visit                     Verdia Bolt, PT, GCS 02/25/24,10:13 AM

## 2024-02-25 NOTE — Plan of Care (Signed)

## 2024-02-25 NOTE — Plan of Care (Signed)
  Problem: Education: Goal: Knowledge of General Education information will improve Description: Including pain rating scale, medication(s)/side effects and non-pharmacologic comfort measures Outcome: Progressing   Problem: Clinical Measurements: Goal: Ability to maintain clinical measurements within normal limits will improve Outcome: Progressing Goal: Will remain free from infection Outcome: Progressing Goal: Diagnostic test results will improve Outcome: Progressing Goal: Respiratory complications will improve Outcome: Progressing Goal: Cardiovascular complication will be avoided Outcome: Progressing   Problem: Pain Managment: Goal: General experience of comfort will improve and/or be controlled Outcome: Progressing

## 2024-02-25 NOTE — Progress Notes (Signed)
 Occupational Therapy Treatment Patient Details Name: [name] [name] MRN: [id number] DOB: 06-12-1951 Today's Date: 02/25/2024   History of present illness 73 y/o female presented to ED on 02/23/24 after being found down. Admitted for rhabdomyolysis and acute encephalopathy. PMH: stroke, hypothyroidism, GERD, dementia, CKD-3B, PE/DVT on Eliquis , neuropathy, urinary incontinence, breast cancer (s/p of right mastectomy, chemo and radiation therapy).   OT comments  Chart reviewed to date, pt greeted semi supine in bed, oriented to self, place, grossly to situation, not oriented to date. She is frequently asking if she can go home, calls her husband who she says is waiting for her at the hospital (when in fact he is also a patient here at the hospital). Pt performs bed mobility with MIN A, STS with CGA-MIN A, amb approx 15' in room with CGA-MIN A. Pt requires MAX A for peri care, doffing/donning underwear. Frequent cueing for technique is required throughout. Pt is left in chair, safety maintained, all needs met. OT will continue to follow.       If plan is discharge home, recommend the following:  A little help with walking and/or transfers;A lot of help with bathing/dressing/bathroom;Assistance with cooking/housework;Help with stairs or ramp for entrance;Direct supervision/assist for medications management;Direct supervision/assist for financial management   Equipment Recommendations  BSC/3in1    Recommendations for Other Services      Precautions / Restrictions Precautions Precautions: Fall Recall of Precautions/Restrictions: Impaired       Mobility Bed Mobility Overal bed mobility: Needs Assistance Bed Mobility: Supine to Sit     Supine to sit: Min assist, HOB elevated, Used rails          Transfers Overall transfer level: Needs assistance Equipment used: Rolling walker (2 wheels) Transfers: Sit to/from Stand, Bed to chair/wheelchair/BSC Sit to Stand: Min assist                  Balance Overall balance assessment: Needs assistance Sitting-balance support: Feet supported Sitting balance-Leahy Scale: Good     Standing balance support: Bilateral upper extremity supported, During functional activity, Reliant on assistive device for balance Standing balance-Leahy Scale: Fair                             ADL either performed or assessed with clinical judgement   ADL Overall ADL's : Needs assistance/impaired             Lower Body Bathing: Minimal assistance;Sit to/from stand       Lower Body Dressing: Maximal assistance;Sit to/from stand Lower Body Dressing Details (indicate cue type and reason): doff/donn underwear Toilet Transfer: Minimal assistance;Rolling walker (2 wheels);Cueing for sequencing;Cueing for safety   Toileting- Clothing Manipulation and Hygiene: Maximal assistance;Sit to/from stand       Functional mobility during ADLs: Contact guard assist;Minimal assistance;Rolling walker (2 wheels);Cueing for sequencing;Cueing for safety (approx 15' in room)      Extremity/Trunk Assessment              Vision       Perception     Praxis     Communication Communication Communication: No apparent difficulties   Cognition Arousal: Alert Behavior During Therapy: WFL for tasks assessed/performed Cognition: Cognition impaired   Orientation impairments: Time   Memory impairment (select all impairments): Declarative long-term memory, Short-term memory, Working memory, Non-declarative long-term memory Attention impairment (select first level of impairment): Sustained attention Executive functioning impairment (select all impairments): Initiation, Reasoning, Problem solving OT - Cognition Comments:  pt reports her husband is here visiting her, when in fact he is also a patient; son in room at end of session confirms this is not her baseline                 Following commands: Intact Following commands impaired:  Only follows one step commands consistently      Cueing   Cueing Techniques: Verbal cues  Exercises Other Exercises Other Exercises: edu pt re: role of OT, role of rehab, discharge recommendations    Shoulder Instructions       General Comments vss throughout on RA    Pertinent Vitals/ Pain       Pain Assessment Pain Assessment: 0-10 Pain Score: 7  Pain Location: generalized, leg soreness Pain Descriptors / Indicators: Discomfort, Sore (pt reports she does not want pain meds) Pain Intervention(s): Monitored during session, Repositioned  Home Living                                          Prior Functioning/Environment              Frequency  Min 2X/week        Progress Toward Goals  OT Goals(current goals can now be found in the care plan section)  Progress towards OT goals: Progressing toward goals  Acute Rehab OT Goals Time For Goal Achievement: 03/09/24  Plan      Co-evaluation                 AM-PAC OT 6 Clicks Daily Activity     Outcome Measure   Help from another person eating meals?: None Help from another person taking care of personal grooming?: A Little Help from another person toileting, which includes using toliet, bedpan, or urinal?: A Little Help from another person bathing (including washing, rinsing, drying)?: A Little Help from another person to put on and taking off regular upper body clothing?: A Little Help from another person to put on and taking off regular lower body clothing?: A Lot 6 Click Score: 18    End of Session Equipment Utilized During Treatment: Rolling walker (2 wheels)  OT Visit Diagnosis: Other abnormalities of gait and mobility (R26.89);Muscle weakness (generalized) (M62.81);History of falling (Z91.81)   Activity Tolerance Patient tolerated treatment well   Patient Left in chair;with call bell/phone within reach;with chair alarm set   Nurse Communication Mobility status         Time: 8490-8461 OT Time Calculation (min): 29 min  Charges: OT General Charges $OT Visit: 1 Visit OT Treatments $Self Care/Home Management : 8-22 mins $Therapeutic Activity: 8-22 mins  Therisa Sheffield, OTD OTR/L  02/25/24, 4:09 PM

## 2024-02-26 DIAGNOSIS — T796XXA Traumatic ischemia of muscle, initial encounter: Secondary | ICD-10-CM | POA: Diagnosis not present

## 2024-02-26 LAB — COMPREHENSIVE METABOLIC PANEL WITH GFR
ALT: 31 U/L (ref 0–44)
AST: 43 U/L — ABNORMAL HIGH (ref 15–41)
Albumin: 2.8 g/dL — ABNORMAL LOW (ref 3.5–5.0)
Alkaline Phosphatase: 68 U/L (ref 38–126)
Anion gap: 6 (ref 5–15)
BUN: 11 mg/dL (ref 8–23)
CO2: 26 mmol/L (ref 22–32)
Calcium: 8.3 mg/dL — ABNORMAL LOW (ref 8.9–10.3)
Chloride: 110 mmol/L (ref 98–111)
Creatinine, Ser: 1.49 mg/dL — ABNORMAL HIGH (ref 0.44–1.00)
GFR, Estimated: 37 mL/min — ABNORMAL LOW (ref >60)
Glucose, Bld: 162 mg/dL — ABNORMAL HIGH (ref 70–99)
Potassium: 3.2 mmol/L — ABNORMAL LOW (ref 3.5–5.1)
Sodium: 142 mmol/L (ref 135–145)
Total Bilirubin: 0.8 mg/dL (ref 0.0–1.2)
Total Protein: 5.9 g/dL — ABNORMAL LOW (ref 6.5–8.1)

## 2024-02-26 LAB — CBC
HCT: 36.4 % (ref 36.0–46.0)
Hemoglobin: 11.8 g/dL — ABNORMAL LOW (ref 12.0–15.0)
MCH: 26.7 pg (ref 26.0–34.0)
MCHC: 32.4 g/dL (ref 30.0–36.0)
MCV: 82.4 fL (ref 80.0–100.0)
Platelets: 165 K/uL (ref 150–400)
RBC: 4.42 MIL/uL (ref 3.87–5.11)
RDW: 15.8 % — ABNORMAL HIGH (ref 11.5–15.5)
WBC: 4.9 K/uL (ref 4.0–10.5)
nRBC: 0 % (ref 0.0–0.2)

## 2024-02-26 LAB — CK: Total CK: 1093 U/L — ABNORMAL HIGH (ref 38–234)

## 2024-02-26 MED ORDER — POTASSIUM CHLORIDE 10 MEQ/100ML IV SOLN
10.0000 meq | INTRAVENOUS | Status: AC
  Administered 2024-02-26 (×3): 10 meq via INTRAVENOUS
  Filled 2024-02-26 (×3): qty 100

## 2024-02-26 NOTE — Care Management Important Message (Signed)
 Important Message  Patient Details  Name: [name] [name] MRN: [id number] Date of Birth: [birth year]   Important Message Given:  Yes - Medicare IM     [name] [name] 02/26/2024, 2:33 PM

## 2024-02-26 NOTE — Plan of Care (Signed)
  Problem: Education: Goal: Knowledge of General Education information will improve Description: Including pain rating scale, medication(s)/side effects and non-pharmacologic comfort measures Outcome: Progressing   Problem: Health Behavior/Discharge Planning: Goal: Ability to manage health-related needs will improve Outcome: Progressing   Problem: Clinical Measurements: Goal: Ability to maintain clinical measurements within normal limits will improve Outcome: Progressing Goal: Will remain free from infection Outcome: Progressing Goal: Diagnostic test results will improve Outcome: Progressing Goal: Respiratory complications will improve Outcome: Progressing   Problem: Activity: Goal: Risk for activity intolerance will decrease Outcome: Progressing   Problem: Nutrition: Goal: Adequate nutrition will be maintained Outcome: Progressing   Problem: Elimination: Goal: Will not experience complications related to bowel motility Outcome: Progressing Goal: Will not experience complications related to urinary retention Outcome: Progressing   

## 2024-02-26 NOTE — Progress Notes (Addendum)
 Pt is transferring to room 123. Report called to receiving nurse Pola. Assessment completed, night time meds administered, and 1 run of potassium completed. 2 more runs of potassium are needed, receiving nurse made aware. I will call family to make them aware of the new room number.   Pt's husband Mr. [name] [name] made aware that pt is in room 123.

## 2024-02-26 NOTE — Progress Notes (Signed)
 Physical Therapy Treatment Patient Details Name: [name] [name] MRN: [id number] DOB: Apr 17, 1951 Today's Date: 02/26/2024   History of Present Illness 73 y/o female presented to ED on 02/23/24 after being found down. Admitted for rhabdomyolysis and acute encephalopathy. PMH: stroke, hypothyroidism, GERD, dementia, CKD-3B, PE/DVT on Eliquis , neuropathy, urinary incontinence, breast cancer (s/p of right mastectomy, chemo and radiation therapy).    PT Comments  Patient received standing at sink with OT present in room. Patient is agreeable to ambulation. She ambulated 200 feet with RW and cga. No lob or difficulties noted/reported. Some confusion and cues needed but no real physical assistance. She will continue to benefit from skilled PT to improve safety with mobility.      If plan is discharge home, recommend the following: A little help with walking and/or transfers;A little help with bathing/dressing/bathroom;Assistance with cooking/housework;Assist for transportation;Help with stairs or ramp for entrance   Can travel by private vehicle      yes  Equipment Recommendations  None recommended by PT    Recommendations for Other Services       Precautions / Restrictions Precautions Precautions: Fall Recall of Precautions/Restrictions: Impaired Restrictions Weight Bearing Restrictions Per Provider Order: No     Mobility  Bed Mobility               General bed mobility comments: NT patient received standing at sink with OT present on arrival    Transfers Overall transfer level: Needs assistance Equipment used: Rolling walker (2 wheels) Transfers: Sit to/from Stand Sit to Stand: Min assist           General transfer comment: min A for safety with sitting in recliner. Cues for hand placement and to get lined up to chair    Ambulation/Gait Ambulation/Gait assistance: Contact guard assist Gait Distance (Feet): 200 Feet Assistive device: Rolling walker (2 wheels) Gait  Pattern/deviations: Step-through pattern, Decreased step length - right, Decreased step length - left, Decreased stride length Gait velocity: decr     General Gait Details: No lob, slowed pace   Stairs             Wheelchair Mobility     Tilt Bed    Modified Rankin (Stroke Patients Only)       Balance Overall balance assessment: Needs assistance Sitting-balance support: Feet supported Sitting balance-Leahy Scale: Good Sitting balance - Comments: CGA at EOB   Standing balance support: Bilateral upper extremity supported, During functional activity, Reliant on assistive device for balance Standing balance-Leahy Scale: Good Standing balance comment: cga while ambulating and using RW                            Communication Communication Communication: No apparent difficulties  Cognition Arousal: Alert Behavior During Therapy: WFL for tasks assessed/performed   PT - Cognitive impairments: Memory, Awareness, Problem solving, Safety/Judgement                       PT - Cognition Comments: patient with confusion this session Following commands: Impaired Following commands impaired: Follows one step commands inconsistently    Cueing Cueing Techniques: Verbal cues, Gestural cues  Exercises      General Comments        Pertinent Vitals/Pain Pain Assessment Pain Assessment: No/denies pain Pain Intervention(s): Monitored during session    Home Living  Prior Function            PT Goals (current goals can now be found in the care plan section) Acute Rehab PT Goals Patient Stated Goal: to get stronger, go home PT Goal Formulation: With patient Time For Goal Achievement: 03/09/24 Potential to Achieve Goals: Fair Progress towards PT goals: Progressing toward goals    Frequency    Min 3X/week      PT Plan      Co-evaluation              AM-PAC PT 6 Clicks Mobility   Outcome  Measure  Help needed turning from your back to your side while in a flat bed without using bedrails?: A Little Help needed moving from lying on your back to sitting on the side of a flat bed without using bedrails?: A Little Help needed moving to and from a bed to a chair (including a wheelchair)?: A Little Help needed standing up from a chair using your arms (e.g., wheelchair or bedside chair)?: A Little Help needed to walk in hospital room?: A Little Help needed climbing 3-5 steps with a railing? : A Lot 6 Click Score: 17    End of Session   Activity Tolerance: Patient tolerated treatment well Patient left: in chair;with call bell/phone within reach;with family/visitor present;with nursing/sitter in room Nurse Communication: Mobility status PT Visit Diagnosis: Muscle weakness (generalized) (M62.81);History of falling (Z91.81)     Time: 1056-1106 PT Time Calculation (min) (ACUTE ONLY): 10 min  Charges:    $Gait Training: 8-22 mins PT General Charges $$ ACUTE PT VISIT: 1 Visit                     Hershal Eriksson, PT, GCS 02/26/24,11:46 AM

## 2024-02-26 NOTE — [place] FL2 (Signed)
 [place]  MEDICAID FL2 LEVEL OF CARE FORM     IDENTIFICATION  Patient Name: [name] [name] Birthdate: 1950-10-09 Sex: female Admission Date (Current Location): 02/23/2024  [facility] and [place] Number:  Chiropodist and Address:  [facility], [address], [place], [place] [address]      Provider Number: [id number]  Attending Physician Name and Address:  Roann Gouty, MD  Relative Name and Phone Number:  Penne Hosick/[phone]    Current Level of Care: Hospital Recommended Level of Care: Skilled Nursing Facility Prior Approval Number:    Date Approved/Denied:   PASRR Number: [id number]  Discharge Plan: SNF    Current Diagnoses: Patient Active Problem List   Diagnosis Date Noted   possible syncope 02/24/2024   Acute encephalopathy 02/24/2024   Rhabdomyolysis 02/23/2024   Stroke (HCC) 02/23/2024   HLD (hyperlipidemia) 02/23/2024   Chronic kidney disease, stage 3b (HCC) 02/23/2024   Overweight (BMI 25.0-29.9) 02/23/2024   Hyponatremia 02/23/2024   Fall at home, initial encounter 02/23/2024   Stroke-like symptom 03/20/2023   Mixed hyperlipidemia 02/06/2022   GERD (gastroesophageal reflux disease) 02/06/2022   Anxiety 02/06/2022   Acute ischemic stroke (HCC) 02/05/2022   Left leg pain 11/21/2021   Hypocalcemia 11/21/2021   Acute metabolic encephalopathy 11/21/2021   AKI (acute kidney injury) (HCC) 11/20/2021   Acquired hypothyroidism 11/20/2021   Neuropathy 11/20/2021   DVT (deep venous thrombosis) (HCC) 09/20/2018   Pulmonary emboli (HCC) 08/18/2018   Pulmonary embolism (HCC) 08/18/2018   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   Pure hypercholesterolemia 02/01/2011    Orientation RESPIRATION BLADDER Height & Weight     Self, Time, Situation, Place  Normal Continent, External catheter Weight: 176 lb 5.9 oz (80 kg) Height:  5' 7 (170.2 cm)  BEHAVIORAL SYMPTOMS/MOOD NEUROLOGICAL BOWEL NUTRITION STATUS    (No behaviors)  (None) Continent Diet (Regular)  AMBULATORY STATUS COMMUNICATION OF NEEDS Skin   Limited Assist Verbally Bruising, Skin abrasions, Other (Comment) (ERYTHEMA/Redness)                       Personal Care Assistance Level of Assistance  Bathing, Feeding, Dressing Bathing Assistance: Limited assistance Feeding assistance: Limited assistance Dressing Assistance: Limited assistance     Functional Limitations Info  Sight, Hearing, Speech Sight Info: Adequate Hearing Info: Adequate Speech Info: Adequate    SPECIAL CARE FACTORS FREQUENCY  PT (By licensed PT), OT (By licensed OT)     PT Frequency: 5x a week OT Frequency: 5x a week            Contractures Contractures Info: Not present    Additional Factors Info  Code Status, Allergies Code Status Info: Full Allergies Info: None           Current Medications (02/26/2024):  This is the current hospital active medication list Current Facility-Administered Medications  Medication Dose Route Frequency Provider Last Rate Last Admin   acetaminophen  (TYLENOL ) tablet 650 mg  650 mg Oral Q6H PRN Niu, Xilin, MD       apixaban  (ELIQUIS ) tablet 5 mg  5 mg Oral BID Niu, Xilin, MD   5 mg at 02/26/24 1016   Chlorhexidine  Gluconate Cloth 2 % PADS 6 each  6 each Topical Daily Laurita Pillion, MD   6 each at 02/26/24 1020   cholecalciferol  (VITAMIN D3) 25 MCG (1000 UNIT) tablet 5,000 Units  5,000 Units Oral q morning Niu, Xilin, MD   5,000 Units at 02/26/24 1016  cyanocobalamin  (VITAMIN B12) tablet 500 mcg  500 mcg Oral q morning Niu, Xilin, MD   500 mcg at 02/26/24 1017   levothyroxine  (SYNTHROID ) tablet 125 mcg  125 mcg Oral Q0600 Niu, Xilin, MD   125 mcg at 02/26/24 0612   memantine  (NAMENDA ) tablet 5 mg  5 mg Oral BID Niu, Xilin, MD   5 mg at 02/26/24 1017   oxyCODONE -acetaminophen  (PERCOCET/ROXICET) 5-325 MG per tablet 1 tablet  1 tablet Oral Q4H PRN Niu, Xilin, MD       pantoprazole  (PROTONIX ) EC tablet 40 mg  40 mg Oral  Daily Niu, Xilin, MD   40 mg at 02/26/24 1017   sodium chloride  tablet 1 g  1 g Oral BID WC Niu, Xilin, MD   1 g at 02/26/24 9155     Discharge Medications: Please see discharge summary for a list of discharge medications.  Relevant Imaging Results:  Relevant Lab Results:   Additional Information SS# [id number]  [facility] JINNY Ruts, LCSW

## 2024-02-26 NOTE — TOC Progression Note (Addendum)
 Transition of Care [facility] - Progression Note    Patient Details  Name: [name] [name] MRN: [id number] Date of Birth: Apr 07, 1951  [facility]) CM/SW Contact  Lauraine JAYSON Carpen, LCSW Phone Number: 02/26/2024, 11:39 AM  Clinical Narrative:   CSW met with patient, husband, and stepdaughter. Confirmed plan to accept bed offer from [facility]. Husband will be going there as well. Per RN, provider said patient will be stable for discharge today pending lab results. [facility] can accept patient today depending on time that authorization comes back. Patient and family are aware. Patient does not qualify for ambulance transport. Family will transport. CSW updated the CIR admissions coordinator.  1:43 pm: Auth still pending.  3:04 pm: Auth still pending.  4:23 pm: Auth still pending.  Expected Discharge Plan and Services                                               Social Drivers of Health (SDOH) Interventions SDOH Screenings   Food Insecurity: No Food Insecurity (02/23/2024)  Housing: Low Risk  (02/23/2024)  Transportation Needs: No Transportation Needs (02/23/2024)  Utilities: Not At Risk (02/23/2024)  Financial Resource Strain: Low Risk  (12/10/2023)   Received from [facility]  Physical Activity: Unknown (05/28/2017)   Received from [facility]  Social Connections: Socially Integrated (02/23/2024)  Tobacco Use: Low Risk  (02/23/2024)    Readmission Risk Interventions     No data to display

## 2024-02-26 NOTE — Progress Notes (Signed)
 Inpatient Rehab Admissions Coordinator:   Received update from [facility] that family preferred d/c to [facility] since pt's spouse would require SNF stay as well.  I will sign off for CIR.   Reche Lowers, PT, DPT Admissions Coordinator [phone] 02/26/24  12:37 PM

## 2024-02-26 NOTE — Progress Notes (Signed)
  Progress Note   Patient: [name] [name] FMW:[id number] DOB: Dec 17, 1950 DOA: 02/23/2024     2 DOS: the patient was seen and examined on 02/26/2024   Brief hospital course: [name] [name] is a 73 y.o. female with medical history significant of stroke, hypothyroidism, GERD, dementia, CKD-3B, PE/DVT on Eliquis , neuropathy, urinary incontinence, breast cancer (s/p of right mastectomy, chemo and radiation therapy), who presents with fall and syncope.  Per the son, patient only has a mild aphasia, was walking without assist before.  She had a fall prior to her admission to the hospital. Upon arriving to hospital, patient had hyponatremia at 127, metabolic acidosis of 18, rhabdomyolysis of 3623. In the morning of 7/29, patient mental status seem to be worsening, she only respond to painful stimuli.  Has been seen by neurology, CT head did not show any acute changes.  Patient was evaluated by physical therapy and she was able to walk.  PT recommended for possible acute rehab.  However today, patient said she is feeling better and she was able to walk with physical therapy and wanted to go home.  Assessment and Plan:  Acute encephalopathy. Likely brain contusion. Patient mental status improved, she is alert awake and oriented The brain was done which did not show any acute pathology No further action needed this issue   Fall at home, possible syncope. Severe rhabdomyolysis secondary to fall. Replace the potassium, continue IV fluids.  Echocardiogram obtained, continue telemetry. Patient could have had a syncope episode, if patient condition improves, she will need a ZIO recorder at discharge.   Chronic kidney disease stage IIIb. Hyponatremia, sodium improved today at 142 Hypokalemia resolved, today at 3.7 Continue fluids.  Replete potassium.   History of pulm emboli and DVT. Start back on Eliquis  7/30.   Overweight BMI 27.62.   History of stroke. Does not seem to have a new stroke.   Patient being seen by neurology.         Subjective: Denies any chest pain palpitations  Physical Exam: Vitals:   02/26/24 0800 02/26/24 0900 02/26/24 1630 02/26/24 1740  BP: (!) 89/76 (!) 151/139 (!) 157/76 (!) 154/81  Pulse: 90 86    Resp: (!) 21 10 18  (!) 21  Temp:      TempSrc:      SpO2: 96% 99%    Weight:      Height:       Constitutional: Alert, awake, calm, comfortable HEENT: Neck supple, bilateral frequent side improved Respiratory: clear to auscultation bilaterally, no wheezing, no crackles. Normal respiratory effort. No accessory muscle use.  Cardiovascular: Regular rate and rhythm, no murmurs / rubs / gallops. No extremity edema. 2+ pedal pulses. No carotid bruits.  Abdomen: no tenderness, no masses palpated. No hepatosplenomegaly. Bowel sounds positive.  Musculoskeletal: no clubbing / cyanosis. No joint deformity upper and lower extremities. Good ROM, no contractures. Normal muscle tone.  Skin: no rashes, lesions, ulcers. No induration Neurologic: CN 2-12 grossly intact. Sensation intact, DTR normal. Strength 5/5 x all 4 extremities.  Psychiatric: Normal judgment and insight. Alert and oriented x 3. Normal mood.   Data Reviewed:  Potassium 3.2, creatinine 1.49  Family Communication: Husband was at bedside  Disposition: Status is: Inpatient Remains inpatient appropriate because: Ongoing recovery from fall and deconditioning  Planned Discharge Destination: Rehab    Time spent: 35 minutes  Author: Nena Rebel, MD 02/26/2024 6:46 PM  For on call review [email or web address].

## 2024-02-26 NOTE — Progress Notes (Signed)
 Occupational Therapy Treatment Patient Details Name: ANESIA BLACKWELL MRN: [id number] DOB: 1950/10/26 Today's Date: 02/26/2024   History of present illness 73 y/o female presented to ED on 02/23/24 after being found down. Admitted for rhabdomyolysis and acute encephalopathy. PMH: stroke, hypothyroidism, GERD, dementia, CKD-3B, PE/DVT on Eliquis , neuropathy, urinary incontinence, breast cancer (s/p of right mastectomy, chemo and radiation therapy).   OT comments  Chart reviewed to date, pt greeted semi supine in bed, appears more confused than yesterday but agreeable to OT tx session targeting improving functional activity tolerance in prep for ADLs. Pt performs bed mobility with CGA, STS with CGA-MIN A, amb to toilet with CGA-MIN A with RW; Completes standing and sitting grooming tasks with CGA- MIN A. Frequent multi modal cues required throughout. PT in room during session and amb with pt while this therapist discusses discharge recommendations with provider/nurse/step daughter. Pt is making progress towards goals, OT will continue to follow.       If plan is discharge home, recommend the following:  A little help with walking and/or transfers;A lot of help with bathing/dressing/bathroom;Assistance with cooking/housework;Help with stairs or ramp for entrance;Direct supervision/assist for medications management;Direct supervision/assist for financial management   Equipment Recommendations  BSC/3in1    Recommendations for Other Services      Precautions / Restrictions Precautions Precautions: Fall Recall of Precautions/Restrictions: Impaired Restrictions Weight Bearing Restrictions Per Provider Order: No       Mobility Bed Mobility Overal bed mobility: Needs Assistance Bed Mobility: Supine to Sit     Supine to sit: Min assist, HOB elevated, Used rails          Transfers Overall transfer level: Needs assistance Equipment used: Rolling walker (2 wheels) Transfers: Sit to/from  Stand Sit to Stand: Min assist                 Balance Overall balance assessment: Needs assistance Sitting-balance support: Feet supported Sitting balance-Leahy Scale: Good     Standing balance support: Bilateral upper extremity supported, During functional activity, Reliant on assistive device for balance Standing balance-Leahy Scale: Fair                             ADL either performed or assessed with clinical judgement   ADL Overall ADL's : Needs assistance/impaired     Grooming: Minimal assistance;Supervision/safety;Sitting Grooming Details (indicate cue type and reason): shampoo cap             Lower Body Dressing: Supervision/safety Lower Body Dressing Details (indicate cue type and reason): donn crocs Toilet Transfer: Contact guard assist;Minimal assistance;Rolling walker (2 wheels);Cueing for sequencing;Cueing for safety;Regular Toilet   Toileting- Clothing Manipulation and Hygiene: Minimal assistance;Sitting/lateral lean;Sit to/from stand       Functional mobility during ADLs: Contact guard assist;Minimal assistance;Rolling walker (2 wheels);Cueing for sequencing;Cueing for safety      Extremity/Trunk Assessment              Vision       Perception     Praxis     Communication Communication Communication: No apparent difficulties   Cognition Arousal: Alert Behavior During Therapy: Restless Cognition: Cognition impaired   Orientation impairments: Time, Situation Awareness: Intellectual awareness impaired, Online awareness impaired Memory impairment (select all impairments): Declarative long-term memory, Short-term memory, Working memory, Non-declarative long-term memory Attention impairment (select first level of impairment): Sustained attention Executive functioning impairment (select all impairments): Initiation, Reasoning, Problem solving  Following commands: Impaired Following commands  impaired: Follows one step commands inconsistently, Follows one step commands with increased time      Cueing   Cueing Techniques: Verbal cues, Gestural cues  Exercises Other Exercises Other Exercises: edu re: role of OT, role of rehab, discharge recommendations    Shoulder Instructions       General Comments vss throughout on RA    Pertinent Vitals/ Pain       Pain Assessment Pain Assessment: No/denies pain  Home Living                                          Prior Functioning/Environment              Frequency  Min 2X/week        Progress Toward Goals  OT Goals(current goals can now be found in the care plan section)  Progress towards OT goals: Progressing toward goals  Acute Rehab OT Goals Time For Goal Achievement: 03/09/24  Plan      Co-evaluation                 AM-PAC OT 6 Clicks Daily Activity     Outcome Measure   Help from another person eating meals?: None Help from another person taking care of personal grooming?: A Little Help from another person toileting, which includes using toliet, bedpan, or urinal?: A Little Help from another person bathing (including washing, rinsing, drying)?: A Little Help from another person to put on and taking off regular upper body clothing?: A Little Help from another person to put on and taking off regular lower body clothing?: A Lot 6 Click Score: 18    End of Session Equipment Utilized During Treatment: Rolling walker (2 wheels)  OT Visit Diagnosis: Other abnormalities of gait and mobility (R26.89);Muscle weakness (generalized) (M62.81);History of falling (Z91.81)   Activity Tolerance Patient tolerated treatment well   Patient Left in chair;with call bell/phone within reach;with chair alarm set   Nurse Communication          Time: 8956-8890 OT Time Calculation (min): 26 min  Charges: OT General Charges $OT Visit: 1 Visit OT Treatments $Self Care/Home Management :  8-22 mins Therisa Sheffield, OTD OTR/L  02/26/24, 1:56 PM

## 2024-02-26 NOTE — Progress Notes (Signed)
 Inpatient Rehab Admissions Coordinator:   PMR MD to review today and I will f/u afterwards.    Reche Lowers, PT, DPT Admissions Coordinator [phone] 02/26/24  11:39 AM

## 2024-02-27 DIAGNOSIS — T796XXA Traumatic ischemia of muscle, initial encounter: Secondary | ICD-10-CM | POA: Diagnosis not present

## 2024-02-27 LAB — CBC
HCT: 35.1 % — ABNORMAL LOW (ref 36.0–46.0)
Hemoglobin: 11.2 g/dL — ABNORMAL LOW (ref 12.0–15.0)
MCH: 26.4 pg (ref 26.0–34.0)
MCHC: 31.9 g/dL (ref 30.0–36.0)
MCV: 82.8 fL (ref 80.0–100.0)
Platelets: 193 K/uL (ref 150–400)
RBC: 4.24 MIL/uL (ref 3.87–5.11)
RDW: 15.8 % — ABNORMAL HIGH (ref 11.5–15.5)
WBC: 5.3 K/uL (ref 4.0–10.5)
nRBC: 0 % (ref 0.0–0.2)

## 2024-02-27 LAB — BASIC METABOLIC PANEL WITH GFR
Anion gap: 5 (ref 5–15)
BUN: 13 mg/dL (ref 8–23)
CO2: 26 mmol/L (ref 22–32)
Calcium: 8.3 mg/dL — ABNORMAL LOW (ref 8.9–10.3)
Chloride: 113 mmol/L — ABNORMAL HIGH (ref 98–111)
Creatinine, Ser: 1.36 mg/dL — ABNORMAL HIGH (ref 0.44–1.00)
GFR, Estimated: 41 mL/min — ABNORMAL LOW (ref >60)
Glucose, Bld: 117 mg/dL — ABNORMAL HIGH (ref 70–99)
Potassium: 3.5 mmol/L (ref 3.5–5.1)
Sodium: 144 mmol/L (ref 135–145)

## 2024-02-27 LAB — CK: Total CK: 716 U/L — ABNORMAL HIGH (ref 38–234)

## 2024-02-27 NOTE — Progress Notes (Signed)
 Rn was notified from OT that patient had wet the bed. Earlier in bed patient seemed cognizant but she stated that she is incontinent and has been for years. She said she doesn't know when it comes and can't hold urine. I will add purewick for patient to prevent skin breakdown.

## 2024-02-27 NOTE — TOC Transition Note (Signed)
 Transition of Care [facility]) - Discharge Note   Patient Details  Name: [name] [name] MRN: [id number] Date of Birth: [birth year]  [facility]) CM/SW Contact:  Marinda Cooks, RN Phone Number: 02/27/2024, 11:24 AM   Clinical Narrative:    This CM updated by covering MD pt medically cleared to dc today and has active DC order . This CM spoke with  Admission liaison @ [facility] [address] [name] and confirmed ins auth approval and bed offer for pt to dc to facility today .  DC transportation confirmed for pt with family Medical team updated . No additional DC needs requested by medical team or identified by CM at this time .     Final next level of care: Skilled Nursing Facility Barriers to Discharge: No Barriers Identified   Patient Goals and CMS Choice    Provided to pt and family      Discharge Placement  SNF               Patient to be transferred to facility by: [name] Daughter in Law Name of family member notified: [name] ( Daughter in [place]) Patient and family notified of of transfer: 02/27/24  Discharge Plan and Services Additional resources added to the After Visit Summary for                                       Social Drivers of Health (SDOH) Interventions SDOH Screenings   Food Insecurity: No Food Insecurity (02/23/2024)  Housing: Low Risk  (02/23/2024)  Transportation Needs: No Transportation Needs (02/23/2024)  Utilities: Not At Risk (02/23/2024)  Financial Resource Strain: Low Risk  (12/10/2023)   Received [facility]  Physical Activity: Unknown (05/28/2017)   Received from [facility]  Social Connections: Socially Integrated (02/23/2024)  Tobacco Use: Low Risk  (02/23/2024)     Readmission Risk Interventions     No data to display

## 2024-02-27 NOTE — TOC Progression Note (Signed)
 Transition of Care [facility]) - Progression Note    Patient Details  Name: [name] [name] MRN: [id number] Date of Birth: October 29, 1950  Transition of Care [facility]) CM/SW Contact  Lauraine JAYSON Carpen, LCSW Phone Number: 02/27/2024, 9:28 AM  Clinical Narrative:   Shara approved: [id number]. Valid 7/31-8/4. [facility] admissions coordinator is aware and confirmed they can accept patient today.  Expected Discharge Plan and Services                                               Social Drivers of Health (SDOH) Interventions SDOH Screenings   Food Insecurity: No Food Insecurity (02/23/2024)  Housing: Low Risk  (02/23/2024)  Transportation Needs: No Transportation Needs (02/23/2024)  Utilities: Not At Risk (02/23/2024)  Financial Resource Strain: Low Risk  (12/10/2023)   Received from [facility]  Physical Activity: Unknown (05/28/2017)   Received from [facility]  Social Connections: Socially Integrated (02/23/2024)  Tobacco Use: Low Risk  (02/23/2024)    Readmission Risk Interventions     No data to display

## 2024-02-27 NOTE — Progress Notes (Signed)
 Occupational Therapy Treatment Patient Details Name: [name] [name] MRN: [id number] DOB: 09/16/1950 Today's Date: 02/27/2024   History of present illness 73 y/o female presented to ED on 02/23/24 after being found down. Admitted for rhabdomyolysis and acute encephalopathy. PMH: stroke, hypothyroidism, GERD, dementia, CKD-3B, PE/DVT on Eliquis , neuropathy, urinary incontinence, breast cancer (s/p of right mastectomy, chemo and radiation therapy).   OT comments  Chart reviewed to date,pt greeted semi supine in bed, oriented to self, place, grossly to situation. Pt is agreeable to OT tx session targeting improving functional activity tolerance in prep for ADL. Pt requires MOD A for peri care after she is incontinent of urine. CGA to amb in room with RW, MIN A for no AD (this Is pt baseline per her report) but frequent multi modal cues throughout for sequencing/safety. MIN A required for LB dressing. Linen change provided and pt is left in chair with all needs met, safety maintained. Pt is making progress towards goals, discharge recommendation remains appropriate. OT will continue to follow       If plan is discharge home, recommend the following:  A little help with walking and/or transfers;A lot of help with bathing/dressing/bathroom;Assistance with cooking/housework;Help with stairs or ramp for entrance;Direct supervision/assist for medications management;Direct supervision/assist for financial management   Equipment Recommendations  BSC/3in1    Recommendations for Other Services      Precautions / Restrictions Precautions Precautions: Fall Recall of Precautions/Restrictions: Impaired Restrictions Weight Bearing Restrictions Per Provider Order: No       Mobility Bed Mobility Overal bed mobility: Needs Assistance Bed Mobility: Supine to Sit     Supine to sit: HOB elevated, Used rails, Contact guard          Transfers Overall transfer level: Needs assistance Equipment used:  None Transfers: Sit to/from Stand Sit to Stand: Min assist           General transfer comment: frequent verbal and tactile cues for technique/pacing     Balance Overall balance assessment: Needs assistance Sitting-balance support: Feet supported Sitting balance-Leahy Scale: Good     Standing balance support: During functional activity, No upper extremity supported Standing balance-Leahy Scale: Fair                             ADL either performed or assessed with clinical judgement   ADL Overall ADL's : Needs assistance/impaired Eating/Feeding: Set up;Sitting           Lower Body Bathing: Moderate assistance;Sit to/from stand   Upper Body Dressing : Minimal assistance;Standing Upper Body Dressing Details (indicate cue type and reason): doff/donn gown Lower Body Dressing: Minimal assistance Lower Body Dressing Details (indicate cue type and reason): set up to donn crocs, MIN A for LB dressing Toilet Transfer: Minimal assistance;Ambulation;Cueing for safety;Cueing for sequencing Toilet Transfer Details (indicate cue type and reason): simulted with no AD, CGA with RW Toileting- Clothing Manipulation and Hygiene: Moderate assistance;Sit to/from stand Toileting - Clothing Manipulation Details (indicate cue type and reason): incontinent of urine/peri care     Functional mobility during ADLs: Minimal assistance;Cueing for safety;Cueing for sequencing (approx 10' in room)      Extremity/Trunk Assessment              Vision       Perception     Praxis     Communication Communication Communication: No apparent difficulties   Cognition Arousal: Alert Behavior During Therapy: WFL for tasks assessed/performed Cognition: Cognition impaired  Orientation impairments: Time Awareness: Intellectual awareness impaired, Online awareness impaired Memory impairment (select all impairments): Declarative long-term memory, Short-term memory, Working memory,  Non-declarative long-term memory Attention impairment (select first level of impairment): Sustained attention Executive functioning impairment (select all impairments): Initiation, Reasoning, Problem solving OT - Cognition Comments: improved overall from previous session but continues to appear below baselin e                 Following commands: Impaired Following commands impaired: Follows one step commands inconsistently, Follows one step commands with increased time      Cueing   Cueing Techniques: Verbal cues, Gestural cues  Exercises Other Exercises Other Exercises: edu re: role of OT, safe ADL completion    Shoulder Instructions       General Comments vss throughout on RA    Pertinent Vitals/ Pain       Pain Assessment Pain Assessment: No/denies pain  Home Living                                          Prior Functioning/Environment              Frequency  Min 2X/week        Progress Toward Goals  OT Goals(current goals can now be found in the care plan section)  Progress towards OT goals: Progressing toward goals  Acute Rehab OT Goals Time For Goal Achievement: 03/09/24  Plan      Co-evaluation                 AM-PAC OT 6 Clicks Daily Activity     Outcome Measure   Help from another person eating meals?: None Help from another person taking care of personal grooming?: A Little Help from another person toileting, which includes using toliet, bedpan, or urinal?: A Little Help from another person bathing (including washing, rinsing, drying)?: A Little Help from another person to put on and taking off regular upper body clothing?: A Little Help from another person to put on and taking off regular lower body clothing?: A Lot 6 Click Score: 18    End of Session Equipment Utilized During Treatment: Rolling walker (2 wheels)  OT Visit Diagnosis: Other abnormalities of gait and mobility (R26.89);Muscle weakness  (generalized) (M62.81);History of falling (Z91.81)   Activity Tolerance Patient tolerated treatment well   Patient Left in chair;with call bell/phone within reach;with chair alarm set;with nursing/sitter in room (NT in room)   Nurse Communication Mobility status (pt is incontinent at baseline/need for purewik/bring depends from home)        Time: 9069-9049 OT Time Calculation (min): 20 min  Charges: OT General Charges $OT Visit: 1 Visit OT Treatments $Self Care/Home Management : 8-22 mins  Therisa Sheffield, OTD OTR/L  02/27/24, 10:15 AM

## 2024-02-27 NOTE — Discharge Summary (Signed)
 Physician Discharge Summary   Patient: [name] [name] MRN: [id number] DOB: 12-29-1950  Admit date:     02/23/2024  Discharge date: 02/27/24  Discharge Physician: Nena Rebel   PCP: Cleotilde Oneil FALCON, MD   Recommendations at discharge:    Continue working with PT/OT Start taking your Eliquis  for your history of DVT/PE which was held earlier Follow up with PCP/Cardiology and Neurology as outpatient in 2-4 weeks.  Discharge Diagnoses: Principal Problem:   Rhabdomyolysis Active Problems:   Fall at home, initial encounter   possible syncope   Hyponatremia   Acquired hypothyroidism   HLD (hyperlipidemia)   Pulmonary embolism (HCC)   DVT (deep venous thrombosis) (HCC)   Stroke (HCC)   Chronic kidney disease, stage 3b (HCC)   Overweight (BMI 25.0-29.9)   Acute encephalopathy  Resolved Problems:   * No resolved hospital problems. *  Hospital Course: [name] [name] is a 73 y.o. female with medical history significant of stroke, hypothyroidism, GERD, dementia, CKD-3B, PE/DVT on Eliquis , neuropathy, urinary incontinence, breast cancer (s/p of right mastectomy, chemo and radiation therapy), who presents with fall and syncope.  Per the son, patient only has a mild aphasia, was walking without assist before.  She had a fall prior to her admission to the hospital. Upon arriving to hospital, patient had hyponatremia at 127, metabolic acidosis of 18, rhabdomyolysis of 3623. In the morning of 7/29, patient mental status seem to be worsening, she only respond to painful stimuli.  Has been seen by neurology, CT head did not show any acute changes.MRI of brain done 7/30 which did not show any acute finding as well.   Patient was evaluated by physical therapy and she was able to walk.  PT recommended for  acute rehab.  She is in agreement to go to [facility].   Assessment and Plan: Acute encephalopathy. Likely brain contusion. Patient mental status improved, she is alert awake and  oriented The brain was done which did not show any acute pathology No further action needed this issue   Fall at home, possible syncope. Severe rhabdomyolysis secondary to fall. Replace the potassium, continue IV fluids.  Echocardiogram obtained, continue telemetry. Patient could have had a syncope episode, her condition improved appropriately. She should see cardiology as outpatient and 24 hour Holter monitoring as outpatient.   Chronic kidney disease stage IIIb. Hyponatremia, sodium improved today at 142 Hypokalemia resolved, today at 3.5 Continue fluids.  Replete potassium.   History of pulm emboli and DVT. Continue Eliquis  at home dose   Overweight BMI 27.62.   History of stroke. Does not seem to have a new stroke.   Patient being seen by neurology. Follow up as outpatient.          Consultants: Neurology Procedures performed: MRI  Disposition: Rehabilitation facility Diet recommendation:  Regular diet DISCHARGE MEDICATION: Allergies as of 02/27/2024   No Known Allergies      Medication List     TAKE these medications    acetaminophen  325 MG tablet Commonly known as: TYLENOL  Take 325 mg by mouth every 4 (four) hours as needed.   apixaban  5 MG Tabs tablet Commonly known as: ELIQUIS  Take 1 tablet (5 mg total) by mouth 2 (two) times daily.   atorvastatin  40 MG tablet Commonly known as: Lipitor Take 1 tablet (40 mg total) by mouth daily.   cetirizine 10 MG tablet Commonly known as: ZYRTEC Take 10 mg by mouth at bedtime.   Cholecalciferol  125 MCG (5000 UT) Tabs Take  5,000 Units by mouth every morning.   Cyanocobalamin  500 MCG Tbdp Take 500 mcg by mouth every morning.   fluticasone  50 MCG/ACT nasal spray Commonly known as: FLONASE  Place 2 sprays into both nostrils daily as needed for allergies or rhinitis.   gabapentin  300 MG capsule Commonly known as: NEURONTIN  Take 600 mg by mouth at bedtime.   levothyroxine  125 MCG tablet Commonly known as:  SYNTHROID  Take 125 mcg by mouth.   memantine  5 MG tablet Commonly known as: NAMENDA  Take 5 mg by mouth 2 (two) times daily.   omeprazole 20 MG capsule Commonly known as: PRILOSEC Take 20 mg by mouth daily.   temazepam  15 MG capsule Commonly known as: RESTORIL  Take 15 mg by mouth at bedtime.   traZODone  50 MG tablet Commonly known as: DESYREL  Take 50 mg by mouth at bedtime.        Contact information for after-discharge care     Destination     [facility] .   Service: Skilled Nursing Contact information: [address] [place]  [phone]                    Discharge Exam: Fredricka Weights   02/23/24 1512  Weight: 80 kg   Constitutional: Alert, awake, calm, comfortable HEENT: Neck supple Respiratory: clear to auscultation bilaterally, no wheezing, no crackles. Normal respiratory effort. No accessory muscle use.  Cardiovascular: Regular rate and rhythm, no murmurs / rubs / gallops. No extremity edema. 2+ pedal pulses. No carotid bruits.  Abdomen: no tenderness, no masses palpated. No hepatosplenomegaly. Bowel sounds positive.  Musculoskeletal: no clubbing / cyanosis. No joint deformity upper and lower extremities. Good ROM, no contractures. Normal muscle tone.  Skin: no rashes, lesions, ulcers. No induration Neurologic: CN 2-12 grossly intact. Sensation intact, DTR normal. Strength 5/5 x all 4 extremities.  Psychiatric: Normal judgment and insight. Alert and oriented x 3. Normal mood.    Condition at discharge: good  The results of significant diagnostics from this hospitalization (including imaging, microbiology, ancillary and laboratory) are listed below for reference.   Imaging Studies: MR BRAIN WO CONTRAST Result Date: 02/25/2024 CLINICAL DATA:  Provided history: Ataxia, acute, traumatic. Additional history provided: Trauma/fall. Patient found unresponsive and hypotensive, code blue. EXAM: MRI HEAD WITHOUT CONTRAST  TECHNIQUE: Multiplanar, multiecho pulse sequences of the brain and surrounding structures were obtained without intravenous contrast. COMPARISON:  Head CT 02/24/2024.  Brain MRI 03/20/2023. FINDINGS: Brain: No age-advanced or lobar predominant cerebral atrophy. Multifocal T2 FLAIR hyperintense signal abnormality within the cerebral white matter, nonspecific but compatible with moderate chronic small vessel ischemic disease. 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 No cortical encephalomalacia is identified. There is no acute infarct. No chronic intracranial blood products. No extra-axial fluid collection. No midline shift. Vascular: Maintained flow voids within the proximal large arterial vessels. Skull and upper cervical spine: No focal worrisome marrow lesion. Sinuses/Orbits: No mass or acute finding within the imaged orbits. No significant paranasal sinus disease. Other: Left forehead and periorbital/facial hematoma. IMPRESSION: 1. No evidence of an acute intracranial abnormality. 2. Moderate chronic small vessel ischemic changes within the cerebral white matter, similar to the prior MRI of 03/20/2023. 3. 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 4. Left forehead and periorbital/facial hematomas. Electronically Signed   By: Rockey Childs  D.O.   On: 02/25/2024 15:15   CT HEAD CODE STROKE WO CONTRAST` Addendum Date: 02/24/2024 ADDENDUM REPORT: 02/24/2024 05:56 ADDENDUM: Study discussed by telephone with Dr. MADISON PEACHES on 02/24/2024 at 0050 hours. Electronically Signed   By: VEAR Hurst M.D.   On: 02/24/2024 05:56   Result Date: 02/24/2024 CLINICAL DATA:  Code stroke.  73 year old female.  Recent fall. EXAM: CT HEAD WITHOUT CONTRAST TECHNIQUE: Contiguous axial images were obtained from the base of the skull through the  vertex without intravenous contrast. RADIATION DOSE REDUCTION: This exam was performed according to the departmental dose-optimization program which includes automated exposure control, adjustment of the mA and/or kV according to patient size and/or use of iterative reconstruction technique. COMPARISON:  Head CT yesterday. FINDINGS: Brain: No midline shift, ventriculomegaly, mass effect, evidence of mass lesion, intracranial hemorrhage or evidence of cortically based acute infarction. Patchy and confluent bilateral cerebral white matter hypodensity appears stable since yesterday. Some deep white matter capsule involvement and comparatively mild heterogeneity of the deep gray nuclei, stable. Vascular: No suspicious intracranial vascular hyperdensity. Calcified atherosclerosis at the skull base. Skull: Appears stable and intact. No acute osseous abnormality identified. Sinuses/Orbits: Visualized paranasal sinuses and mastoids are stable and well aerated. Other: Scalp and periorbital hematoma on the left redemonstrated. No new orbit or scalp soft tissue finding. No gaze deviation. ASPECTS [facility] Stroke Program Early CT Score) Total score (0-10 with 10 being normal): 10 IMPRESSION: 1. Stable since yesterday, no acute cortically based infarct or acute intracranial hemorrhage identified. ASPECTS 10. 2. Left scalp and periorbital hematomas again noted. No skull fracture identified. Electronically Signed: By: VEAR Hurst M.D. On: 02/24/2024 05:47   CT Maxillofacial Wo Contrast Result Date: 02/23/2024 CLINICAL DATA:  Facial trauma, blunt EXAM: CT MAXILLOFACIAL WITHOUT CONTRAST TECHNIQUE: Multidetector CT imaging of the maxillofacial structures was performed. Multiplanar CT image reconstructions were also generated. RADIATION DOSE REDUCTION: This exam was performed according to the departmental dose-optimization program which includes automated exposure control, adjustment of the mA and/or kV according to patient size and/or  use of iterative reconstruction technique. COMPARISON:  None Available. FINDINGS: Osseous: No fracture or mandibular dislocation. No destructive process. Orbits: Extensive left preseptal soft tissue swelling. No postseptal inflammatory change or retro-orbital hematoma. Ocular globes are intact and ocular lenses are ortho topically position. Extraocular musculature and optic nerves are unremarkable. Sinuses: Clear. Soft tissues: See above. Small left frontal supraorbital scalp hematoma. Soft tissue swelling superficial to the left mandibular body. Limited intracranial: No significant or unexpected finding. IMPRESSION: 1. Extensive left preseptal soft tissue swelling. No postseptal inflammatory change or retro-orbital hematoma. 2. Small left frontal supraorbital scalp hematoma. 3. Soft tissue swelling superficial to the left mandibular body. 4. No acute fracture or mandibular dislocation. Electronically Signed   By: Dorethia Molt M.D.   On: 02/23/2024 19:39   CT Head Wo Contrast Result Date: 02/23/2024 CLINICAL DATA:  Provided history: Ataxia, head trauma. Neck trauma. Additional history provided: Fall. EXAM: CT HEAD WITHOUT CONTRAST CT CERVICAL SPINE WITHOUT CONTRAST TECHNIQUE: Multidetector CT imaging of the head and cervical spine was performed following the standard protocol without intravenous contrast. Multiplanar CT image reconstructions of the cervical spine were also generated. RADIATION DOSE REDUCTION: This exam was performed according to the departmental dose-optimization program which includes automated exposure control, adjustment of the mA and/or kV according to patient size and/or use of iterative reconstruction technique. COMPARISON:  Brain MRI 03/20/2023. MRA head 03/20/2023. Head CT 03/20/2023. FINDINGS: CT HEAD FINDINGS Brain: No age-advanced or lobar  predominant cerebral atrophy. Patchy and ill-defined hypoattenuation within the cerebral white matter, nonspecific but compatible with moderate  chronic small vessel ischemic disease. 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 There is no acute intracranial hemorrhage. No demarcated cortical infarct. No extra-axial fluid collection. No midline shift. Vascular: No hyperdense vessel. Atherosclerotic calcifications. Skull: No calvarial fracture or aggressive osseous lesion. Sinuses/Orbits: Prominent forehead and left periorbital hematomas, incompletely imaged. No acute finding within the orbits. No significant paranasal sinus disease at the imaged levels. CT CERVICAL SPINE FINDINGS Alignment: Levocurvature of the cervical spine. Nonspecific reversal of the expected cervical lordosis. 2 mm C3-C4 grade 1 anterolisthesis. Skull base and vertebrae: The basion-dental and atlanto-dental intervals are maintained.No evidence of acute fracture to the cervical spine. Soft tissues and spinal canal: No prevertebral fluid or swelling. No visible canal hematoma. Left perimandibular hematoma. Disc levels: Cervical spondylosis with multilevel disc space narrowing, disc bulges/central disc protrusions, posterior disc osteophyte complexes, uncovertebral hypertrophy and facet arthropathy. Disc space narrowing is greatest at C4-C5, C5-C6 and C6-C7 (fairly advanced at these levels). No appreciable high-grade spinal canal stenosis. Multilevel bony neural foraminal narrowing. Multilevel ventral osteophytes. Degenerative changes also present at the C1-C2 articulation. Upper chest: No consolidation within the imaged lung apices. No visible pneumothorax. IMPRESSION: CT head: 1. No evidence of an acute intracranial abnormality. 2. Prominent forehead and left periorbital hematomas, incompletely imaged. 3. Moderate chronic small vessel ischemic changes within the cerebral white matter. 4. Unchanged 4 mm hyperdense nodule at the junction of the pituitary stalk and pituitary gland, likely reflecting a  Rathke's cleft cyst. CT cervical spine: 1. No evidence of an acute cervical spine fracture. 2. Levocurvature of the cervical spine. 3. Nonspecific reversal of the expected cervical lordosis. 4. Mild C3-C4 grade 1 anterolisthesis. 5. Cervical spondylosis as described. 6. Left perimandibular hematoma. Electronically Signed   By: Rockey Childs D.O.   On: 02/23/2024 16:12   CT Cervical Spine Wo Contrast Result Date: 02/23/2024 CLINICAL DATA:  Provided history: Ataxia, head trauma. Neck trauma. Additional history provided: Fall. EXAM: CT HEAD WITHOUT CONTRAST CT CERVICAL SPINE WITHOUT CONTRAST TECHNIQUE: Multidetector CT imaging of the head and cervical spine was performed following the standard protocol without intravenous contrast. Multiplanar CT image reconstructions of the cervical spine were also generated. RADIATION DOSE REDUCTION: This exam was performed according to the departmental dose-optimization program which includes automated exposure control, adjustment of the mA and/or kV according to patient size and/or use of iterative reconstruction technique. COMPARISON:  Brain MRI 03/20/2023. MRA head 03/20/2023. Head CT 03/20/2023. FINDINGS: CT HEAD FINDINGS Brain: No age-advanced or lobar predominant cerebral atrophy. Patchy and ill-defined hypoattenuation within the cerebral white matter, nonspecific but compatible with moderate chronic small vessel ischemic disease. 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 There is no acute intracranial hemorrhage. No demarcated cortical infarct. No extra-axial fluid collection. No midline shift. Vascular: No hyperdense vessel. Atherosclerotic calcifications. Skull: No calvarial fracture or aggressive osseous lesion. Sinuses/Orbits: Prominent forehead and left periorbital hematomas, incompletely imaged. No acute finding within the orbits. No significant paranasal sinus disease at the  imaged levels. CT CERVICAL SPINE FINDINGS Alignment: Levocurvature of the cervical spine. Nonspecific reversal of the expected cervical lordosis. 2 mm C3-C4 grade 1 anterolisthesis. Skull base and vertebrae: The basion-dental and atlanto-dental intervals are maintained.No evidence of acute fracture to the cervical spine. Soft tissues  and spinal canal: No prevertebral fluid or swelling. No visible canal hematoma. Left perimandibular hematoma. Disc levels: Cervical spondylosis with multilevel disc space narrowing, disc bulges/central disc protrusions, posterior disc osteophyte complexes, uncovertebral hypertrophy and facet arthropathy. Disc space narrowing is greatest at C4-C5, C5-C6 and C6-C7 (fairly advanced at these levels). No appreciable high-grade spinal canal stenosis. Multilevel bony neural foraminal narrowing. Multilevel ventral osteophytes. Degenerative changes also present at the C1-C2 articulation. Upper chest: No consolidation within the imaged lung apices. No visible pneumothorax. IMPRESSION: CT head: 1. No evidence of an acute intracranial abnormality. 2. Prominent forehead and left periorbital hematomas, incompletely imaged. 3. Moderate chronic small vessel ischemic changes within the cerebral white matter. 4. 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 CT cervical spine: 1. No evidence of an acute cervical spine fracture. 2. Levocurvature of the cervical spine. 3. Nonspecific reversal of the expected cervical lordosis. 4. Mild C3-C4 grade 1 anterolisthesis. 5. Cervical spondylosis as described. 6. Left perimandibular hematoma. Electronically Signed   By: Rockey Childs D.O.   On: 02/23/2024 16:12    Microbiology: Results for orders placed or performed during the hospital encounter of 11/20/21  Resp Panel by RT-PCR (Flu A&B, Covid) Nasopharyngeal Swab     Status: None   Collection Time: 11/20/21  3:28 PM    Specimen: Nasopharyngeal Swab; Nasopharyngeal(NP) swabs in vial transport medium  Result Value Ref Range Status   SARS Coronavirus 2 by RT PCR NEGATIVE NEGATIVE Final    Comment: (NOTE) SARS-CoV-2 target nucleic acids are NOT DETECTED.  The SARS-CoV-2 RNA is generally detectable in upper respiratory specimens during the acute phase of infection. The lowest concentration of SARS-CoV-2 viral copies this assay can detect is 138 copies/mL. A negative result does not preclude SARS-Cov-2 infection and should not be used as the sole basis for treatment or other patient management decisions. A negative result may occur with  improper specimen collection/handling, submission of specimen other than nasopharyngeal swab, presence of viral mutation(s) within the areas targeted by this assay, and inadequate number of viral copies(<138 copies/mL). A negative result must be combined with clinical observations, patient history, and epidemiological information. The expected result is Negative.  Fact Sheet for Patients:  BloggerCourse.com  Fact Sheet for Healthcare Providers:  SeriousBroker.it  This test is no t yet approved or cleared by the [place]  FDA and  has been authorized for detection and/or diagnosis of SARS-CoV-2 by FDA under an Emergency Use Authorization (EUA). This EUA will remain  in effect (meaning this test can be used) for the duration of the COVID-19 declaration under Section 564(b)(1) of the Act, 21 U.S.C.section 360bbb-3(b)(1), unless the authorization is terminated  or revoked sooner.       Influenza A by PCR NEGATIVE NEGATIVE Final   Influenza B by PCR NEGATIVE NEGATIVE Final    Comment: (NOTE) The Xpert Xpress SARS-CoV-2/FLU/RSV plus assay is intended as an aid in the diagnosis of influenza from Nasopharyngeal swab specimens and should not be used as a sole basis for treatment. Nasal washings and aspirates are  unacceptable for Xpert Xpress SARS-CoV-2/FLU/RSV testing.  Fact Sheet for Patients: BloggerCourse.com  Fact Sheet for Healthcare Providers: SeriousBroker.it  This test is not yet approved or cleared by the [place]  FDA and has been authorized for detection and/or diagnosis of SARS-CoV-2 by FDA under an Emergency Use Authorization (EUA). This EUA will remain in effect (meaning this test can be used) for  the duration of the COVID-19 declaration under Section 564(b)(1) of the Act, 21 U.S.C. section 360bbb-3(b)(1), unless the authorization is terminated or revoked.  Performed at [facility], [address] [place], [place] [address]     Labs: CBC: Recent Labs  Lab 02/23/24 1801 02/24/24 0435 02/25/24 0307 02/26/24 0944 02/27/24 0500  WBC 10.1 7.7 5.8 4.9 5.3  NEUTROABS 8.8*  --   --   --   --   HGB 13.5 12.4 12.2 11.8* 11.2*  HCT 40.9 37.6 37.9 36.4 35.1*  MCV 81.6 79.3* 83.5 82.4 82.8  PLT 137* 186 150 165 193   Basic Metabolic Panel: Recent Labs  Lab 02/23/24 2344 02/24/24 0435 02/24/24 0827 02/25/24 0307 02/26/24 0944 02/27/24 0500  NA  --  135 136 142 142 144  K  --  3.1* 3.3* 3.7 3.2* 3.5  CL  --  104 103 113* 110 113*  CO2  --  22 21* 21* 26 26  GLUCOSE  --  115* 115* 83 162* 117*  BUN  --  20 19 15 11 13   CREATININE  --  1.37* 1.50* 1.14* 1.49* 1.36*  CALCIUM   --  8.2* 8.4* 8.2* 8.3* 8.3*  MG 1.7  --   --  2.0  --   --   PHOS 2.8  --   --  1.9*  --   --    Liver Function Tests: Recent Labs  Lab 02/26/24 0944  AST 43*  ALT 31  ALKPHOS 68  BILITOT 0.8  PROT 5.9*  ALBUMIN 2.8*   CBG: Recent Labs  Lab 02/24/24 1628 02/24/24 1937 02/24/24 2338 02/25/24 0324 02/25/24 0354  GLUCAP 116* 89 89 71 77    Discharge time spent: greater than 30 minutes.  Signed: Nena Rebel, MD [facility] 02/27/2024

## 2024-02-27 NOTE — Plan of Care (Signed)

## 2024-03-01 ENCOUNTER — Encounter: Payer: Self-pay | Admitting: Student

## 2024-03-01 ENCOUNTER — Non-Acute Institutional Stay (SKILLED_NURSING_FACILITY): Admitting: Student

## 2024-03-01 DIAGNOSIS — N179 Acute kidney failure, unspecified: Secondary | ICD-10-CM | POA: Diagnosis not present

## 2024-03-01 DIAGNOSIS — T796XXD Traumatic ischemia of muscle, subsequent encounter: Secondary | ICD-10-CM | POA: Diagnosis not present

## 2024-03-01 DIAGNOSIS — F03B0 Unspecified dementia, moderate, without behavioral disturbance, psychotic disturbance, mood disturbance, and anxiety: Secondary | ICD-10-CM

## 2024-03-01 DIAGNOSIS — R55 Syncope and collapse: Secondary | ICD-10-CM

## 2024-03-01 DIAGNOSIS — Z79899 Other long term (current) drug therapy: Secondary | ICD-10-CM

## 2024-03-01 DIAGNOSIS — Z8673 Personal history of transient ischemic attack (TIA), and cerebral infarction without residual deficits: Secondary | ICD-10-CM

## 2024-03-01 DIAGNOSIS — F419 Anxiety disorder, unspecified: Secondary | ICD-10-CM

## 2024-03-01 DIAGNOSIS — G4700 Insomnia, unspecified: Secondary | ICD-10-CM

## 2024-03-01 NOTE — Progress Notes (Unsigned)
 Provider:  Richerd MYRTIS Brigham, M.D. Location:  Other [facility]) Nursing Home Room Number: 113 A Place of Service:  SNF (31)  PCP: Cleotilde Oneil FALCON, MD Patient Care Team: Cleotilde Oneil FALCON, MD as PCP - General (Internal Medicine)  Extended Emergency Contact Information Primary Emergency Contact: Menser,Brandon Address: [address]          [place], [place] [address] [place] Home Phone: [phone] Mobile Phone: [phone] Relation: Son Secondary Emergency Contact: Dinsmore,Robert Address: [address]          [place], [place] [address] [place] Home Phone: [phone] Mobile Phone: [phone] Relation: Spouse  Code Status: Full Code  Goals of Care: Advanced Directive information    02/23/2024   11:00 PM  Advanced Directives  Does Patient Have a Medical Advance Directive? No  Would patient like information on creating a medical advance directive? No - Patient declined      Chief Complaint  Patient presents with   New Admit To SNF    New admission to [facility]     HPI: Patient is a 73 y.o. female seen today for admission to  Past Medical History:  Diagnosis Date   Anxiety    Cancer (HCC)    2006 right side breast ca. lumpectomy, skin ca on same breast    GERD (gastroesophageal reflux disease)    Hypercholesteremia    Peripheral neuropathic pain    Personal history of chemotherapy    2006/2014   Personal history of radiation therapy    2006/2014   Pulmonary emboli [facility])    Urinary incontinence    Past Surgical History:  Procedure Laterality Date   APPENDECTOMY     CARDIAC CATHETERIZATION Left 05/23/2016   Procedure: Left Heart Cath and Coronary Angiography;  Surgeon: Marsa Dooms, MD;  Location: [facility] INVASIVE CV LAB;  Service: Cardiovascular;  Laterality: Left;   IVC FILTER INSERTION N/A 08/19/2018   Procedure: IVC FILTER INSERTION;  Surgeon: Marea Selinda RAMAN, MD;  Location: [facility] INVASIVE CV LAB;  Service: Cardiovascular;   Laterality: N/A;   MASTECTOMY Right 2006/2014   rt breast removal     cancer    reports that she has never smoked. She has never used smokeless tobacco. She reports that she does not drink alcohol and does not use drugs. Social History   Socioeconomic History   Marital status: Married    Spouse name: Not on file   Number of children: Not on file   Years of education: Not on file   Highest education level: Not on file  Occupational History   Not on file  Tobacco Use   Smoking status: Never   Smokeless tobacco: Never  Vaping Use   Vaping status: Never Used  Substance and Sexual Activity   Alcohol use: Never   Drug use: No   Sexual activity: Not on file  Other Topics Concern   Not on file  Social History Narrative   Not on file   Social Drivers of Health   Financial Resource Strain: Low Risk  (12/10/2023)   Received from [facility]   Overall Financial Resource Strain (CARDIA)    Difficulty of Paying Living Expenses: Not hard at all  Food Insecurity: No Food Insecurity (02/23/2024)   Hunger Vital Sign    Worried About Running Out of Food in the Last Year: Never true    Ran Out of Food in the Last Year: Never true  Transportation Needs: No Transportation Needs (02/23/2024)  PRAPARE - Administrator, Civil Service (Medical): No    Lack of Transportation (Non-Medical): No  Physical Activity: Unknown (05/28/2017)   Received from [facility]   Exercise Vital Sign    Days of Exercise per Week: 0 days    Minutes of Exercise per Session: Not on file  Stress: Not on file  Social Connections: Socially Integrated (02/23/2024)   Social Connection and Isolation Panel    Frequency of Communication with Friends and Family: More than three times a week    Frequency of Social Gatherings with Friends and Family: Three times a week    Attends Religious Services: More than 4 times per year    Active Member of Clubs or Organizations: Yes     Attends Banker Meetings: More than 4 times per year    Marital Status: Married  Catering manager Violence: Not At Risk (02/23/2024)   Humiliation, Afraid, Rape, and Kick questionnaire    Fear of Current or Ex-Partner: No    Emotionally Abused: No    Physically Abused: No    Sexually Abused: No    Functional Status Survey:    Family History  Problem Relation Age of Onset   Asthma Mother    COPD Mother    CVA Father    Heart attack Father    Coronary artery disease Father    Schizophrenia Brother    Peripheral vascular disease Brother    Breast cancer Neg Hx     Health Maintenance  Topic Date Due   Medicare Annual Wellness (AWV)  Never done   COVID-19 Vaccine (1) Never done   Hepatitis C Screening  Never done   DTaP/Tdap/Td (1 - Tdap) Never done   Zoster Vaccines- Shingrix (1 of 2) 07[birth year]11/1968   Colonoscopy  Never done   MAMMOGRAM  Never done   DEXA SCAN  Never done   INFLUENZA VACCINE  02/27/2024   Pneumococcal Vaccine: 50+ Years  Completed   Hepatitis B Vaccines  Aged Out   HPV VACCINES  Aged Out   Meningococcal B Vaccine  Aged Out    No Known Allergies  Outpatient Encounter Medications as of 03/01/2024  Medication Sig   acetaminophen  (TYLENOL ) 325 MG tablet Take 325 mg by mouth every 4 (four) hours as needed.   apixaban  (ELIQUIS ) 5 MG TABS tablet Take 1 tablet (5 mg total) by mouth 2 (two) times daily.   atorvastatin  (LIPITOR) 40 MG tablet Take 1 tablet (40 mg total) by mouth daily.   cetirizine  (ZYRTEC ) 10 MG tablet Take 10 mg by mouth at bedtime.   Cholecalciferol  125 MCG (5000 UT) TABS Take 5,000 Units by mouth every morning.   Cyanocobalamin  500 MCG TBDP Take 500 mcg by mouth every morning.   fluticasone  (FLONASE ) 50 MCG/ACT nasal spray Place 2 sprays into both nostrils daily as needed for allergies or rhinitis.   gabapentin  (NEURONTIN ) 600 MG tablet Take 600 mg by mouth at bedtime.   levothyroxine  (SYNTHROID , LEVOTHROID) 125 MCG tablet Take 125  mcg by mouth.    memantine  (NAMENDA ) 5 MG tablet Take 5 mg by mouth 2 (two) times daily.   omeprazole  (PRILOSEC) 20 MG capsule Take 20 mg by mouth daily.   temazepam  (RESTORIL ) 15 MG capsule Take 15 mg by mouth at bedtime.   traZODone  (DESYREL ) 50 MG tablet Take 50 mg by mouth at bedtime.   gabapentin  (NEURONTIN ) 300 MG capsule Take 600 mg by mouth at bedtime. (Patient not taking: Reported  on 03/01/2024)   No facility-administered encounter medications on file as of 03/01/2024.    Review of Systems  Vitals:   03/01/24 0846 03/01/24 0857  BP: (S) (!) 155/83 (S) (!) 159/81  Pulse: 83   Resp: 18   Temp: (!) 97.1 F (36.2 C)   SpO2: 95%   Weight: 188 lb 9.6 oz (85.5 kg)   Height: 5' 7 (1.702 m)    Body mass index is 29.54 kg/m. Physical Exam  Labs reviewed: Basic Metabolic Panel: Recent Labs    02/23/24 2344 02/24/24 0435 02/25/24 0307 02/26/24 0944 02/27/24 0500  NA  --    < > 142 142 144  K  --    < > 3.7 3.2* 3.5  CL  --    < > 113* 110 113*  CO2  --    < > 21* 26 26  GLUCOSE  --    < > 83 162* 117*  BUN  --    < > 15 11 13   CREATININE  --    < > 1.14* 1.49* 1.36*  CALCIUM   --    < > 8.2* 8.3* 8.3*  MG 1.7  --  2.0  --   --   PHOS 2.8  --  1.9*  --   --    < > = values in this interval not displayed.   Liver Function Tests: Recent Labs    03/20/23 1114 02/26/24 0944  AST 22 43*  ALT 15 31  ALKPHOS 88 68  BILITOT 1.1 0.8  PROT 7.4 5.9*  ALBUMIN 3.8 2.8*   No results for input(s): LIPASE, AMYLASE in the last 8760 hours. No results for input(s): AMMONIA in the last 8760 hours. CBC: Recent Labs    03/20/23 1114 02/23/24 1801 02/24/24 0435 02/25/24 0307 02/26/24 0944 02/27/24 0500  WBC 5.5 10.1   < > 5.8 4.9 5.3  NEUTROABS 4.2 8.8*  --   --   --   --   HGB 13.4 13.5   < > 12.2 11.8* 11.2*  HCT 41.5 40.9   < > 37.9 36.4 35.1*  MCV 85.2 81.6   < > 83.5 82.4 82.8  PLT 184 137*   < > 150 165 193   < > = values in this interval not displayed.    Cardiac Enzymes: Recent Labs    02/25/24 0307 02/26/24 0944 02/27/24 0500  CKTOTAL 2,443* 1,093* 716*   BNP: Invalid input(s): POCBNP Lab Results  Component Value Date   HGBA1C 5.3 02/05/2022   Lab Results  Component Value Date   TSH 1.880 02/24/2024   No results found for: VITAMINB12 No results found for: FOLATE No results found for: IRON, TIBC, FERRITIN  Imaging and Procedures obtained prior to SNF admission: CT HEAD CODE STROKE WO CONTRAST` Addendum Date: 02/24/2024 ADDENDUM REPORT: 02/24/2024 05:56 ADDENDUM: Study discussed by telephone with Dr. MADISON PEACHES on 02/24/2024 at 0050 hours. Electronically Signed   By: VEAR Hurst M.D.   On: 02/24/2024 05:56   Result Date: 02/24/2024 CLINICAL DATA:  Code stroke.  73 year old female.  Recent fall. EXAM: CT HEAD WITHOUT CONTRAST TECHNIQUE: Contiguous axial images were obtained from the base of the skull through the vertex without intravenous contrast. RADIATION DOSE REDUCTION: This exam was performed according to the departmental dose-optimization program which includes automated exposure control, adjustment of the mA and/or kV according to patient size and/or use of iterative reconstruction technique. COMPARISON:  Head CT yesterday. FINDINGS: Brain: No midline shift, ventriculomegaly,  mass effect, evidence of mass lesion, intracranial hemorrhage or evidence of cortically based acute infarction. Patchy and confluent bilateral cerebral white matter hypodensity appears stable since yesterday. Some deep white matter capsule involvement and comparatively mild heterogeneity of the deep gray nuclei, stable. Vascular: No suspicious intracranial vascular hyperdensity. Calcified atherosclerosis at the skull base. Skull: Appears stable and intact. No acute osseous abnormality identified. Sinuses/Orbits: Visualized paranasal sinuses and mastoids are stable and well aerated. Other: Scalp and periorbital hematoma on the left redemonstrated. No new  orbit or scalp soft tissue finding. No gaze deviation. ASPECTS [facility] Stroke Program Early CT Score) Total score (0-10 with 10 being normal): 10 IMPRESSION: 1. Stable since yesterday, no acute cortically based infarct or acute intracranial hemorrhage identified. ASPECTS 10. 2. Left scalp and periorbital hematomas again noted. No skull fracture identified. Electronically Signed: By: VEAR Hurst M.D. On: 02/24/2024 05:47   CT Maxillofacial Wo Contrast Result Date: 02/23/2024 CLINICAL DATA:  Facial trauma, blunt EXAM: CT MAXILLOFACIAL WITHOUT CONTRAST TECHNIQUE: Multidetector CT imaging of the maxillofacial structures was performed. Multiplanar CT image reconstructions were also generated. RADIATION DOSE REDUCTION: This exam was performed according to the departmental dose-optimization program which includes automated exposure control, adjustment of the mA and/or kV according to patient size and/or use of iterative reconstruction technique. COMPARISON:  None Available. FINDINGS: Osseous: No fracture or mandibular dislocation. No destructive process. Orbits: Extensive left preseptal soft tissue swelling. No postseptal inflammatory change or retro-orbital hematoma. Ocular globes are intact and ocular lenses are ortho topically position. Extraocular musculature and optic nerves are unremarkable. Sinuses: Clear. Soft tissues: See above. Small left frontal supraorbital scalp hematoma. Soft tissue swelling superficial to the left mandibular body. Limited intracranial: No significant or unexpected finding. IMPRESSION: 1. Extensive left preseptal soft tissue swelling. No postseptal inflammatory change or retro-orbital hematoma. 2. Small left frontal supraorbital scalp hematoma. 3. Soft tissue swelling superficial to the left mandibular body. 4. No acute fracture or mandibular dislocation. Electronically Signed   By: Dorethia Molt M.D.   On: 02/23/2024 19:39   CT Head Wo Contrast Result Date: 02/23/2024 CLINICAL DATA:   Provided history: Ataxia, head trauma. Neck trauma. Additional history provided: Fall. EXAM: CT HEAD WITHOUT CONTRAST CT CERVICAL SPINE WITHOUT CONTRAST TECHNIQUE: Multidetector CT imaging of the head and cervical spine was performed following the standard protocol without intravenous contrast. Multiplanar CT image reconstructions of the cervical spine were also generated. RADIATION DOSE REDUCTION: This exam was performed according to the departmental dose-optimization program which includes automated exposure control, adjustment of the mA and/or kV according to patient size and/or use of iterative reconstruction technique. COMPARISON:  Brain MRI 03/20/2023. MRA head 03/20/2023. Head CT 03/20/2023. FINDINGS: CT HEAD FINDINGS Brain: No age-advanced or lobar predominant cerebral atrophy. Patchy and ill-defined hypoattenuation within the cerebral white matter, nonspecific but compatible with moderate chronic small vessel ischemic disease. 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 There is no acute intracranial hemorrhage. No demarcated cortical infarct. No extra-axial fluid collection. No midline shift. Vascular: No hyperdense vessel. Atherosclerotic calcifications. Skull: No calvarial fracture or aggressive osseous lesion. Sinuses/Orbits: Prominent forehead and left periorbital hematomas, incompletely imaged. No acute finding within the orbits. No significant paranasal sinus disease at the imaged levels. CT CERVICAL SPINE FINDINGS Alignment: Levocurvature of the cervical spine. Nonspecific reversal of the expected cervical lordosis. 2 mm C3-C4 grade 1 anterolisthesis. Skull base and vertebrae: The basion-dental and atlanto-dental intervals are maintained.No evidence  of acute fracture to the cervical spine. Soft tissues and spinal canal: No prevertebral fluid or swelling. No visible canal hematoma. Left perimandibular hematoma.  Disc levels: Cervical spondylosis with multilevel disc space narrowing, disc bulges/central disc protrusions, posterior disc osteophyte complexes, uncovertebral hypertrophy and facet arthropathy. Disc space narrowing is greatest at C4-C5, C5-C6 and C6-C7 (fairly advanced at these levels). No appreciable high-grade spinal canal stenosis. Multilevel bony neural foraminal narrowing. Multilevel ventral osteophytes. Degenerative changes also present at the C1-C2 articulation. Upper chest: No consolidation within the imaged lung apices. No visible pneumothorax. IMPRESSION: CT head: 1. No evidence of an acute intracranial abnormality. 2. Prominent forehead and left periorbital hematomas, incompletely imaged. 3. Moderate chronic small vessel ischemic changes within the cerebral white matter. 4. 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 CT cervical spine: 1. No evidence of an acute cervical spine fracture. 2. Levocurvature of the cervical spine. 3. Nonspecific reversal of the expected cervical lordosis. 4. Mild C3-C4 grade 1 anterolisthesis. 5. Cervical spondylosis as described. 6. Left perimandibular hematoma. Electronically Signed   By: Rockey Childs D.O.   On: 02/23/2024 16:12   CT Cervical Spine Wo Contrast Result Date: 02/23/2024 CLINICAL DATA:  Provided history: Ataxia, head trauma. Neck trauma. Additional history provided: Fall. EXAM: CT HEAD WITHOUT CONTRAST CT CERVICAL SPINE WITHOUT CONTRAST TECHNIQUE: Multidetector CT imaging of the head and cervical spine was performed following the standard protocol without intravenous contrast. Multiplanar CT image reconstructions of the cervical spine were also generated. RADIATION DOSE REDUCTION: This exam was performed according to the departmental dose-optimization program which includes automated exposure control, adjustment of the mA and/or kV according to patient size and/or use  of iterative reconstruction technique. COMPARISON:  Brain MRI 03/20/2023. MRA head 03/20/2023. Head CT 03/20/2023. FINDINGS: CT HEAD FINDINGS Brain: No age-advanced or lobar predominant cerebral atrophy. Patchy and ill-defined hypoattenuation within the cerebral white matter, nonspecific but compatible with moderate chronic small vessel ischemic disease. 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 There is no acute intracranial hemorrhage. No demarcated cortical infarct. No extra-axial fluid collection. No midline shift. Vascular: No hyperdense vessel. Atherosclerotic calcifications. Skull: No calvarial fracture or aggressive osseous lesion. Sinuses/Orbits: Prominent forehead and left periorbital hematomas, incompletely imaged. No acute finding within the orbits. No significant paranasal sinus disease at the imaged levels. CT CERVICAL SPINE FINDINGS Alignment: Levocurvature of the cervical spine. Nonspecific reversal of the expected cervical lordosis. 2 mm C3-C4 grade 1 anterolisthesis. Skull base and vertebrae: The basion-dental and atlanto-dental intervals are maintained.No evidence of acute fracture to the cervical spine. Soft tissues and spinal canal: No prevertebral fluid or swelling. No visible canal hematoma. Left perimandibular hematoma. Disc levels: Cervical spondylosis with multilevel disc space narrowing, disc bulges/central disc protrusions, posterior disc osteophyte complexes, uncovertebral hypertrophy and facet arthropathy. Disc space narrowing is greatest at C4-C5, C5-C6 and C6-C7 (fairly advanced at these levels). No appreciable high-grade spinal canal stenosis. Multilevel bony neural foraminal narrowing. Multilevel ventral osteophytes. Degenerative changes also present at the C1-C2 articulation. Upper chest: No consolidation within the imaged lung apices. No visible pneumothorax. IMPRESSION: CT head: 1. No  evidence of an acute intracranial abnormality. 2. Prominent forehead and left periorbital hematomas, incompletely imaged. 3. Moderate chronic small vessel ischemic changes within the cerebral white matter. 4. 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 CT cervical spine: 1. No evidence of an acute cervical spine fracture. 2. Levocurvature of the cervical spine. 3. Nonspecific reversal of the expected cervical lordosis. 4. Mild C3-C4 grade 1 anterolisthesis. 5. Cervical spondylosis as described. 6. Left perimandibular hematoma. Electronically Signed   By: Rockey Childs D.O.   On: 02/23/2024 16:12    Assessment/Plan There are no diagnoses linked to this encounter.   Family/ staff Communication:   Labs/tests ordered:

## 2024-03-02 ENCOUNTER — Encounter: Payer: Self-pay | Admitting: Student

## 2024-03-02 DIAGNOSIS — Z79899 Other long term (current) drug therapy: Secondary | ICD-10-CM | POA: Insufficient documentation

## 2024-03-02 DIAGNOSIS — Z8673 Personal history of transient ischemic attack (TIA), and cerebral infarction without residual deficits: Secondary | ICD-10-CM | POA: Insufficient documentation

## 2024-03-02 DIAGNOSIS — F03B0 Unspecified dementia, moderate, without behavioral disturbance, psychotic disturbance, mood disturbance, and anxiety: Secondary | ICD-10-CM | POA: Insufficient documentation

## 2024-03-02 DIAGNOSIS — G4700 Insomnia, unspecified: Secondary | ICD-10-CM | POA: Insufficient documentation

## 2024-03-02 MED ORDER — TEMAZEPAM 7.5 MG PO CAPS
7.5000 mg | ORAL_CAPSULE | Freq: Every day | ORAL | 0 refills | Status: DC
Start: 2024-03-02 — End: 2024-03-05

## 2024-03-05 ENCOUNTER — Encounter: Payer: Self-pay | Admitting: Student

## 2024-03-05 DIAGNOSIS — Z79899 Other long term (current) drug therapy: Secondary | ICD-10-CM

## 2024-03-05 DIAGNOSIS — F03B0 Unspecified dementia, moderate, without behavioral disturbance, psychotic disturbance, mood disturbance, and anxiety: Secondary | ICD-10-CM

## 2024-03-05 DIAGNOSIS — G4700 Insomnia, unspecified: Secondary | ICD-10-CM

## 2024-03-05 DIAGNOSIS — R55 Syncope and collapse: Secondary | ICD-10-CM

## 2024-03-05 DIAGNOSIS — F419 Anxiety disorder, unspecified: Secondary | ICD-10-CM

## 2024-03-05 DIAGNOSIS — Z8673 Personal history of transient ischemic attack (TIA), and cerebral infarction without residual deficits: Secondary | ICD-10-CM

## 2024-03-05 DIAGNOSIS — N179 Acute kidney failure, unspecified: Secondary | ICD-10-CM

## 2024-03-05 DIAGNOSIS — T796XXD Traumatic ischemia of muscle, subsequent encounter: Secondary | ICD-10-CM

## 2024-03-05 MED ORDER — TRAZODONE HCL 50 MG PO TABS: 50.0000 mg | ORAL_TABLET | Freq: Every day | ORAL | 0 refills | Status: AC

## 2024-03-05 MED ORDER — TEMAZEPAM 7.5 MG PO CAPS: 7.5000 mg | ORAL_CAPSULE | Freq: Every day | ORAL | 0 refills | Status: AC

## 2024-03-05 MED ORDER — MEMANTINE HCL 5 MG PO TABS: 5.0000 mg | ORAL_TABLET | Freq: Two times a day (BID) | ORAL | 0 refills | Status: AC

## 2024-03-05 MED ORDER — FLUTICASONE PROPIONATE 50 MCG/ACT NA SUSP
2.0000 | Freq: Every day | NASAL | 0 refills | Status: AC | PRN
Start: 2024-03-05 — End: 2024-03-19

## 2024-03-05 MED ORDER — OMEPRAZOLE 20 MG PO CPDR
20.0000 mg | DELAYED_RELEASE_CAPSULE | Freq: Every day | ORAL | 0 refills | Status: AC
Start: 2024-03-05 — End: 2024-03-19

## 2024-03-05 MED ORDER — CHOLECALCIFEROL 25 MCG (1000 UT) PO CAPS: 2000.0000 [IU] | ORAL_CAPSULE | Freq: Every morning | ORAL | 0 refills | Status: AC

## 2024-03-05 MED ORDER — LEVOTHYROXINE SODIUM 125 MCG PO TABS: 125.0000 ug | ORAL_TABLET | Freq: Every day | ORAL | 0 refills | Status: AC

## 2024-03-05 MED ORDER — ACETAMINOPHEN 325 MG PO TABS: 325.0000 mg | ORAL_TABLET | ORAL | 0 refills | Status: AC | PRN

## 2024-03-05 MED ORDER — APIXABAN 5 MG PO TABS: 5.0000 mg | ORAL_TABLET | Freq: Two times a day (BID) | ORAL | Status: AC

## 2024-03-05 MED ORDER — GABAPENTIN 600 MG PO TABS
600.0000 mg | ORAL_TABLET | Freq: Every day | ORAL | 0 refills | Status: AC
Start: 2024-03-05 — End: 2024-03-19

## 2024-03-05 MED ORDER — CYANOCOBALAMIN 500 MCG PO TBDP: 500.0000 ug | ORAL_TABLET | Freq: Every morning | ORAL | 0 refills | Status: AC

## 2024-03-05 MED ORDER — CETIRIZINE HCL 10 MG PO TABS: 10.0000 mg | ORAL_TABLET | Freq: Every day | ORAL | 0 refills | Status: AC

## 2024-03-05 MED ORDER — ATORVASTATIN CALCIUM 40 MG PO TABS
40.0000 mg | ORAL_TABLET | Freq: Every day | ORAL | 11 refills | Status: AC
Start: 2024-03-05 — End: 2025-03-05

## 2024-03-05 NOTE — Progress Notes (Signed)
 Erroneous encounter:Orders only written for patient to return home for discharge.
